# Patient Record
Sex: Female | Born: 1935 | Race: Black or African American | Hispanic: No | Smoking: Light tobacco smoker
Health system: Southern US, Community
[De-identification: ages and names within clinical notes are randomized; demographics above are authoritative.]

## PROBLEM LIST (undated history)

## (undated) DIAGNOSIS — Z872 Personal history of diseases of the skin and subcutaneous tissue: Secondary | ICD-10-CM

## (undated) DIAGNOSIS — M1990 Unspecified osteoarthritis, unspecified site: Secondary | ICD-10-CM

## (undated) DIAGNOSIS — N281 Cyst of kidney, acquired: Secondary | ICD-10-CM

## (undated) DIAGNOSIS — F17210 Nicotine dependence, cigarettes, uncomplicated: Secondary | ICD-10-CM

## (undated) DIAGNOSIS — F32A Depression, unspecified: Secondary | ICD-10-CM

## (undated) DIAGNOSIS — F329 Major depressive disorder, single episode, unspecified: Secondary | ICD-10-CM

## (undated) DIAGNOSIS — K5730 Diverticulosis of large intestine without perforation or abscess without bleeding: Secondary | ICD-10-CM

## (undated) DIAGNOSIS — I10 Essential (primary) hypertension: Secondary | ICD-10-CM

## (undated) DIAGNOSIS — M8580 Other specified disorders of bone density and structure, unspecified site: Secondary | ICD-10-CM

## (undated) DIAGNOSIS — I4891 Unspecified atrial fibrillation: Secondary | ICD-10-CM

## (undated) DIAGNOSIS — E785 Hyperlipidemia, unspecified: Secondary | ICD-10-CM

## (undated) DIAGNOSIS — Z800 Family history of malignant neoplasm of digestive organs: Secondary | ICD-10-CM

## (undated) DIAGNOSIS — F419 Anxiety disorder, unspecified: Secondary | ICD-10-CM

## (undated) DIAGNOSIS — G2581 Restless legs syndrome: Secondary | ICD-10-CM

## (undated) HISTORY — DX: Hyperlipidemia, unspecified: E78.5

## (undated) HISTORY — DX: Depression, unspecified: F32.A

## (undated) HISTORY — DX: Unspecified atrial fibrillation: I48.91

## (undated) HISTORY — DX: Restless legs syndrome: G25.81

## (undated) HISTORY — DX: Diverticulosis of large intestine without perforation or abscess without bleeding: K57.30

## (undated) HISTORY — DX: Family history of malignant neoplasm of digestive organs: Z80.0

## (undated) HISTORY — DX: Other specified disorders of bone density and structure, unspecified site: M85.80

## (undated) HISTORY — PX: CATARACT EXTRACTION: SUR2

## (undated) HISTORY — PX: UPPER GASTROINTESTINAL ENDOSCOPY: SHX188

## (undated) HISTORY — DX: Major depressive disorder, single episode, unspecified: F32.9

## (undated) HISTORY — DX: Anxiety disorder, unspecified: F41.9

## (undated) HISTORY — DX: Nicotine dependence, cigarettes, uncomplicated: F17.210

## (undated) HISTORY — DX: Cyst of kidney, acquired: N28.1

## (undated) HISTORY — DX: Essential (primary) hypertension: I10

## (undated) HISTORY — PX: COLONOSCOPY: SHX174

## (undated) HISTORY — DX: Personal history of diseases of the skin and subcutaneous tissue: Z87.2

## (undated) HISTORY — DX: Unspecified osteoarthritis, unspecified site: M19.90

---

## 1998-07-15 ENCOUNTER — Other Ambulatory Visit: Admission: RE | Admit: 1998-07-15 | Discharge: 1998-07-15 | Payer: Self-pay | Admitting: Gynecology

## 1999-07-25 ENCOUNTER — Other Ambulatory Visit: Admission: RE | Admit: 1999-07-25 | Discharge: 1999-07-25 | Payer: Self-pay | Admitting: Gynecology

## 2000-01-10 ENCOUNTER — Encounter: Admission: RE | Admit: 2000-01-10 | Discharge: 2000-01-10 | Payer: Self-pay | Admitting: Pulmonary Disease

## 2000-01-10 ENCOUNTER — Encounter: Payer: Self-pay | Admitting: Pulmonary Disease

## 2000-09-24 ENCOUNTER — Other Ambulatory Visit: Admission: RE | Admit: 2000-09-24 | Discharge: 2000-09-24 | Payer: Self-pay | Admitting: Gynecology

## 2001-05-26 ENCOUNTER — Encounter: Payer: Self-pay | Admitting: Gynecology

## 2001-05-26 ENCOUNTER — Encounter: Admission: RE | Admit: 2001-05-26 | Discharge: 2001-05-26 | Payer: Self-pay | Admitting: Gynecology

## 2002-03-25 ENCOUNTER — Other Ambulatory Visit: Admission: RE | Admit: 2002-03-25 | Discharge: 2002-03-25 | Payer: Self-pay | Admitting: Gynecology

## 2002-06-12 ENCOUNTER — Encounter: Admission: RE | Admit: 2002-06-12 | Discharge: 2002-06-12 | Payer: Self-pay | Admitting: Pulmonary Disease

## 2002-06-12 ENCOUNTER — Encounter: Payer: Self-pay | Admitting: Pulmonary Disease

## 2003-03-22 ENCOUNTER — Encounter: Admission: RE | Admit: 2003-03-22 | Discharge: 2003-03-22 | Payer: Self-pay | Admitting: Specialist

## 2003-03-22 ENCOUNTER — Encounter: Payer: Self-pay | Admitting: Specialist

## 2003-07-09 ENCOUNTER — Encounter: Admission: RE | Admit: 2003-07-09 | Discharge: 2003-07-09 | Payer: Self-pay | Admitting: Pulmonary Disease

## 2003-08-10 ENCOUNTER — Encounter: Admission: RE | Admit: 2003-08-10 | Discharge: 2003-08-10 | Payer: Self-pay | Admitting: Specialist

## 2003-09-28 HISTORY — PX: TOTAL HIP ARTHROPLASTY: SHX124

## 2003-10-22 ENCOUNTER — Inpatient Hospital Stay (HOSPITAL_COMMUNITY): Admission: RE | Admit: 2003-10-22 | Discharge: 2003-10-26 | Payer: Self-pay | Admitting: Specialist

## 2003-10-26 ENCOUNTER — Inpatient Hospital Stay (HOSPITAL_COMMUNITY)
Admission: RE | Admit: 2003-10-26 | Discharge: 2003-11-02 | Payer: Self-pay | Admitting: Physical Medicine & Rehabilitation

## 2004-07-28 ENCOUNTER — Encounter: Admission: RE | Admit: 2004-07-28 | Discharge: 2004-07-28 | Payer: Self-pay | Admitting: Pulmonary Disease

## 2004-08-23 ENCOUNTER — Ambulatory Visit: Payer: Self-pay | Admitting: Pulmonary Disease

## 2005-05-03 ENCOUNTER — Other Ambulatory Visit: Admission: RE | Admit: 2005-05-03 | Discharge: 2005-05-03 | Payer: Self-pay | Admitting: Gynecology

## 2005-08-24 ENCOUNTER — Ambulatory Visit: Payer: Self-pay | Admitting: Pulmonary Disease

## 2005-08-31 ENCOUNTER — Encounter: Admission: RE | Admit: 2005-08-31 | Discharge: 2005-08-31 | Payer: Self-pay | Admitting: Pulmonary Disease

## 2005-10-22 ENCOUNTER — Ambulatory Visit: Payer: Self-pay | Admitting: Pulmonary Disease

## 2005-10-24 ENCOUNTER — Ambulatory Visit: Payer: Self-pay | Admitting: Pulmonary Disease

## 2005-11-26 ENCOUNTER — Ambulatory Visit: Payer: Self-pay | Admitting: Internal Medicine

## 2006-01-22 ENCOUNTER — Ambulatory Visit: Payer: Self-pay | Admitting: Internal Medicine

## 2006-02-04 ENCOUNTER — Ambulatory Visit: Payer: Self-pay | Admitting: Internal Medicine

## 2006-02-21 ENCOUNTER — Ambulatory Visit: Payer: Self-pay | Admitting: Pulmonary Disease

## 2006-06-21 ENCOUNTER — Ambulatory Visit: Payer: Self-pay | Admitting: Pulmonary Disease

## 2006-09-18 ENCOUNTER — Encounter: Admission: RE | Admit: 2006-09-18 | Discharge: 2006-09-18 | Payer: Self-pay | Admitting: Pulmonary Disease

## 2006-10-20 ENCOUNTER — Emergency Department (HOSPITAL_COMMUNITY): Admission: EM | Admit: 2006-10-20 | Discharge: 2006-10-20 | Payer: Self-pay | Admitting: Emergency Medicine

## 2006-12-23 ENCOUNTER — Ambulatory Visit: Payer: Self-pay | Admitting: Pulmonary Disease

## 2007-06-24 ENCOUNTER — Ambulatory Visit: Payer: Self-pay | Admitting: Pulmonary Disease

## 2007-10-02 ENCOUNTER — Encounter: Admission: RE | Admit: 2007-10-02 | Discharge: 2007-10-02 | Payer: Self-pay | Admitting: Pulmonary Disease

## 2007-11-25 DIAGNOSIS — R5381 Other malaise: Secondary | ICD-10-CM | POA: Insufficient documentation

## 2007-11-25 DIAGNOSIS — R5383 Other fatigue: Secondary | ICD-10-CM

## 2007-11-25 DIAGNOSIS — I10 Essential (primary) hypertension: Secondary | ICD-10-CM | POA: Insufficient documentation

## 2007-11-25 DIAGNOSIS — M1990 Unspecified osteoarthritis, unspecified site: Secondary | ICD-10-CM | POA: Insufficient documentation

## 2007-11-25 DIAGNOSIS — E7800 Pure hypercholesterolemia, unspecified: Secondary | ICD-10-CM

## 2007-11-25 DIAGNOSIS — F419 Anxiety disorder, unspecified: Secondary | ICD-10-CM | POA: Insufficient documentation

## 2007-12-22 ENCOUNTER — Ambulatory Visit: Payer: Self-pay | Admitting: Pulmonary Disease

## 2007-12-22 DIAGNOSIS — F17200 Nicotine dependence, unspecified, uncomplicated: Secondary | ICD-10-CM

## 2007-12-22 DIAGNOSIS — K5730 Diverticulosis of large intestine without perforation or abscess without bleeding: Secondary | ICD-10-CM | POA: Insufficient documentation

## 2007-12-22 DIAGNOSIS — H409 Unspecified glaucoma: Secondary | ICD-10-CM

## 2007-12-22 DIAGNOSIS — G2581 Restless legs syndrome: Secondary | ICD-10-CM | POA: Insufficient documentation

## 2007-12-24 ENCOUNTER — Ambulatory Visit: Payer: Self-pay | Admitting: Pulmonary Disease

## 2007-12-28 LAB — CONVERTED CEMR LAB
ALT: 18 units/L (ref 0–35)
AST: 21 units/L (ref 0–37)
Albumin: 3.8 g/dL (ref 3.5–5.2)
BUN: 10 mg/dL (ref 6–23)
Basophils Relative: 0.6 % (ref 0.0–1.0)
Calcium: 9.4 mg/dL (ref 8.4–10.5)
Chloride: 108 meq/L (ref 96–112)
Creatinine, Ser: 0.8 mg/dL (ref 0.4–1.2)
Eosinophils Absolute: 0.2 10*3/uL (ref 0.0–0.7)
Free T4: 0.7 ng/dL (ref 0.6–1.6)
GFR calc non Af Amer: 75 mL/min
HDL: 54.2 mg/dL (ref >39.0)
LDL Cholesterol: 112 mg/dL — ABNORMAL HIGH (ref 0–99)
Lymphocytes Relative: 41.9 % (ref 12.0–46.0)
Monocytes Relative: 11.1 % (ref 3.0–12.0)
Neutro Abs: 1.9 10*3/uL (ref 1.4–7.7)
Platelets: 180 10*3/uL (ref 150–400)
RDW: 12.9 % (ref 11.5–14.6)
T3, Free: 3.5 pg/mL (ref 2.3–4.2)
Total CHOL/HDL Ratio: 3.3
Triglycerides: 73 mg/dL (ref 0–149)

## 2008-01-13 ENCOUNTER — Encounter: Payer: Self-pay | Admitting: Pulmonary Disease

## 2008-02-06 ENCOUNTER — Encounter: Payer: Self-pay | Admitting: Pulmonary Disease

## 2008-06-03 ENCOUNTER — Emergency Department (HOSPITAL_COMMUNITY): Admission: EM | Admit: 2008-06-03 | Discharge: 2008-06-03 | Payer: Self-pay | Admitting: Emergency Medicine

## 2008-06-22 ENCOUNTER — Ambulatory Visit: Payer: Self-pay | Admitting: Pulmonary Disease

## 2008-06-22 DIAGNOSIS — F32A Depression, unspecified: Secondary | ICD-10-CM | POA: Insufficient documentation

## 2008-06-22 DIAGNOSIS — F329 Major depressive disorder, single episode, unspecified: Secondary | ICD-10-CM | POA: Insufficient documentation

## 2008-07-16 ENCOUNTER — Ambulatory Visit: Payer: Self-pay | Admitting: Internal Medicine

## 2008-07-19 ENCOUNTER — Ambulatory Visit: Payer: Self-pay | Admitting: Internal Medicine

## 2008-07-19 ENCOUNTER — Encounter: Payer: Self-pay | Admitting: Internal Medicine

## 2008-07-22 ENCOUNTER — Encounter: Payer: Self-pay | Admitting: Internal Medicine

## 2008-07-26 ENCOUNTER — Ambulatory Visit (HOSPITAL_COMMUNITY): Admission: RE | Admit: 2008-07-26 | Discharge: 2008-07-26 | Payer: Self-pay | Admitting: Internal Medicine

## 2008-08-05 ENCOUNTER — Telehealth: Payer: Self-pay | Admitting: Internal Medicine

## 2008-09-01 ENCOUNTER — Ambulatory Visit: Payer: Self-pay | Admitting: Internal Medicine

## 2008-09-02 ENCOUNTER — Telehealth: Payer: Self-pay | Admitting: Internal Medicine

## 2008-09-07 ENCOUNTER — Encounter: Payer: Self-pay | Admitting: Internal Medicine

## 2008-09-28 ENCOUNTER — Encounter: Payer: Self-pay | Admitting: Internal Medicine

## 2008-10-12 ENCOUNTER — Encounter: Admission: RE | Admit: 2008-10-12 | Discharge: 2008-10-12 | Payer: Self-pay | Admitting: Pulmonary Disease

## 2008-10-22 ENCOUNTER — Encounter: Payer: Self-pay | Admitting: Pulmonary Disease

## 2008-12-20 ENCOUNTER — Ambulatory Visit: Payer: Self-pay | Admitting: Pulmonary Disease

## 2008-12-20 DIAGNOSIS — N281 Cyst of kidney, acquired: Secondary | ICD-10-CM | POA: Insufficient documentation

## 2008-12-21 ENCOUNTER — Ambulatory Visit: Payer: Self-pay | Admitting: Pulmonary Disease

## 2008-12-28 ENCOUNTER — Telehealth: Payer: Self-pay | Admitting: Pulmonary Disease

## 2008-12-28 LAB — CONVERTED CEMR LAB
ALT: 22 units/L (ref 0–35)
AST: 22 units/L (ref 0–37)
Bilirubin, Direct: 0.1 mg/dL (ref 0.0–0.3)
Chloride: 112 meq/L (ref 96–112)
Cholesterol: 168 mg/dL (ref 0–200)
Eosinophils Absolute: 0.2 10*3/uL (ref 0.0–0.7)
LDL Cholesterol: 102 mg/dL — ABNORMAL HIGH (ref 0–99)
MCHC: 34.4 g/dL (ref 30.0–36.0)
MCV: 91.4 fL (ref 78.0–100.0)
Monocytes Absolute: 0.3 10*3/uL (ref 0.1–1.0)
Neutrophils Relative %: 41.6 % — ABNORMAL LOW (ref 43.0–77.0)
Platelets: 168 10*3/uL (ref 150.0–400.0)
Potassium: 4.1 meq/L (ref 3.5–5.1)
RDW: 12.6 % (ref 11.5–14.6)
TSH: 0.79 microintl units/mL (ref 0.35–5.50)
Total Bilirubin: 0.9 mg/dL (ref 0.3–1.2)
Total CHOL/HDL Ratio: 3
Vit D, 25-Hydroxy: 23 ng/mL — ABNORMAL LOW (ref 30–89)

## 2009-06-20 ENCOUNTER — Ambulatory Visit: Payer: Self-pay | Admitting: Pulmonary Disease

## 2009-06-20 DIAGNOSIS — E559 Vitamin D deficiency, unspecified: Secondary | ICD-10-CM

## 2009-10-20 ENCOUNTER — Encounter: Admission: RE | Admit: 2009-10-20 | Discharge: 2009-10-20 | Payer: Self-pay | Admitting: Pulmonary Disease

## 2009-10-28 ENCOUNTER — Telehealth: Payer: Self-pay | Admitting: Pulmonary Disease

## 2009-12-19 ENCOUNTER — Ambulatory Visit: Payer: Self-pay | Admitting: Pulmonary Disease

## 2009-12-27 ENCOUNTER — Ambulatory Visit: Payer: Self-pay | Admitting: Internal Medicine

## 2009-12-27 ENCOUNTER — Ambulatory Visit: Payer: Self-pay | Admitting: Pulmonary Disease

## 2010-06-21 ENCOUNTER — Ambulatory Visit: Payer: Self-pay | Admitting: Pulmonary Disease

## 2010-06-21 DIAGNOSIS — R634 Abnormal weight loss: Secondary | ICD-10-CM

## 2010-06-23 DIAGNOSIS — M810 Age-related osteoporosis without current pathological fracture: Secondary | ICD-10-CM | POA: Insufficient documentation

## 2010-06-23 DIAGNOSIS — M8580 Other specified disorders of bone density and structure, unspecified site: Secondary | ICD-10-CM

## 2010-06-23 LAB — CONVERTED CEMR LAB
Albumin: 3.6 g/dL (ref 3.5–5.2)
BUN: 17 mg/dL (ref 6–23)
Basophils Relative: 0.7 % (ref 0.0–3.0)
Calcium: 9.3 mg/dL (ref 8.4–10.5)
Creatinine, Ser: 0.8 mg/dL (ref 0.4–1.2)
Eosinophils Relative: 3.4 % (ref 0.0–5.0)
Free T4: 0.76 ng/dL (ref 0.60–1.60)
GFR calc non Af Amer: 85.1 mL/min (ref >60)
HCT: 40.2 % (ref 36.0–46.0)
Lymphs Abs: 1.6 10*3/uL (ref 0.7–4.0)
MCV: 92.1 fL (ref 78.0–100.0)
Monocytes Absolute: 0.6 10*3/uL (ref 0.1–1.0)
Monocytes Relative: 12.6 % — ABNORMAL HIGH (ref 3.0–12.0)
RBC: 4.37 M/uL (ref 3.87–5.11)
Total Bilirubin: 0.6 mg/dL (ref 0.3–1.2)
WBC: 4.7 10*3/uL (ref 4.5–10.5)

## 2010-09-24 LAB — CONVERTED CEMR LAB
AST: 28 units/L (ref 0–37)
Albumin: 3.9 g/dL (ref 3.5–5.2)
Alkaline Phosphatase: 81 units/L (ref 39–117)
Basophils Absolute: 0 10*3/uL (ref 0.0–0.1)
CO2: 29 meq/L (ref 19–32)
Calcium: 9.5 mg/dL (ref 8.4–10.5)
Creatinine, Ser: 0.8 mg/dL (ref 0.4–1.2)
Eosinophils Absolute: 0.2 10*3/uL (ref 0.0–0.7)
HCT: 41.6 % (ref 36.0–46.0)
Hemoglobin: 14.1 g/dL (ref 12.0–15.0)
Lymphocytes Relative: 35.4 % (ref 12.0–46.0)
Lymphs Abs: 1.6 10*3/uL (ref 0.7–4.0)
MCHC: 33.8 g/dL (ref 30.0–36.0)
MCV: 91.2 fL (ref 78.0–100.0)
Monocytes Absolute: 0.5 10*3/uL (ref 0.1–1.0)
Neutro Abs: 2.3 10*3/uL (ref 1.4–7.7)
RDW: 13.2 % (ref 11.5–14.6)
Total CHOL/HDL Ratio: 3
Triglycerides: 64 mg/dL (ref 0.0–149.0)

## 2010-09-26 NOTE — Miscellaneous (Signed)
Summary: BONE DENSITY  Clinical Lists Changes  Orders: Added new Test order of T-Bone Densitometry [phone]) - Signed Added new Test order of T-Lumbar Vertebral Assessment [phone]) - Signed  Appended Document: BONE DENSITY called and spoke with pt about her BMD results per SN---bmd shows osteopenia---softening of the bones---rec cont the oscal, mvi and vit d 1000 untis daily and weight bearing exercises.  she is aware of this and will cont

## 2010-09-26 NOTE — Assessment & Plan Note (Signed)
Summary: 6 months/apc   Primary Care Provider:  Alroy Dust, MD  CC:  6 month ROV & review of mult medical problems....  History of Present Illness: [name] here for a follow up visit... she has HBP, Chol, DJD, and mult med problems as noted below...    ~  seen by DrDBrodie 11/09 & subseq w/ dyspepsia, and lower GI symptoms as well... GI  notes reviewed- there is a fam hx of colon ca & a sis w/ pancreatic cancer... she was placed on PPI Rx and Sonar was neg... EGD showed esoph stricture- dilated, and gastritis- Rx w/ PPI... DrHorvath sent her to DrToth for painful defecation & he sent her to DrFuller in [place]... he diagnosed mixed hem's and outlet dysfunction w/ more studies including defecography planned at [facility]...   ~  June 20, 2009:  she reports eval in [place] by DrFuller w/ mult tests and rec for muscle retraining but insurance wouldn't pay for this "experimental therapy" & it was $500 per session therefore she declined...  still smoking but down to 1/4 ppd... BP controlled & taking meds regularly...   ~  December 19, 2009:  she's been getting the "muscle therapy" in [place], now on home exercises... still smoking & we discussed smoking cessation strategies (CXR 10/10 showed NAD).Marland Kitchen. BP controlled & doing satis w/ switch to Losartan... she is due for FASTING blood work & BMD &will ret to our lab for these tests... we discussed Rx for her RLS...    Current Problem List:  FATIGUE (ICD-780.79) - she rests OK, wakes tired most days, no daytime hypersomnolence...  GLAUCOMA (ICD-365.9) - on eye drops per ophthalmology...  CIGARETTE SMOKER (ICD-305.1) - still smokes 1/4+ppd & must quit, she knows the risks, states she'll try nicotine patches...   ~  CXR 10/07 showed sl hyperinflation, mild incr Cor...  ~  CXR in ER 10/09 showed COPD, bibasilar scarring, NAD.Marland Kitchen.  ~  CXR 10/10 showed similar findings- scarring, NAD...  HYPERTENSION (ICD-401.9) - controlled on ASA 81mg /d, TOPROL XL 50mg /d & LOSARTAN/HCT  100-12.5 daily.. BP= 132/78 today- she tells me that she sometimes takes these meds Qod and that she feels better on this schedule & wishes to continue these same meds in this manner... denies HA, visual sx, CP, palpit, change in SOB, edema, etc...  HYPERCHOLESTEROLEMIA (ICD-272.0) - on LIPITOR 20mg /d...  ~  FLP 2/07 showed TChol 159, TG 127, HDL 54, LDL 79  ~  FLP 4/09 showed TChol 181, TG 73, HDL 54, LDL 112  ~  FLP 4/10 showed TChol 168, TG 63, HDL 53, LDL 102  ~  FLP 4/11 =   DYSPEPSIA (ICD-536.8) - on PRILOSEC OTC 20mg /d Prn...  DIVERTICULOSIS OF COLON (ICD-562.10) - she alternates MIRALAX & METAMUCIL Qod... COLONIC POLYPS (ICD-211.3) - last colonoscopy by DrBrodie 6/07 showed divertics, hems... f/u planned 69yrs. Family Hx of COLON CANCER (ICD-153.9) HEMORRHOIDS (ICD-455.6) - "I'm using TUCKS Pads per DrOz"... OTHER SPECIFIED DISORDER OF RECTUM AND ANUS (ICD-569.49) -  - eval by DrToth & referred to [place] DrFuller for painful defecation... he recommended muscle retraining but initially her insurance wouldn't cover this, later they did cover & she had the "muscle therapy" now doing home therapy...  RENAL CYST (ICD-593.2) - Abd Sonar 11/09 showed sm bilat renal cysts...  DEGENERATIVE JOINT DISEASE (ICD-715.90) - s/p right THR 3/05 by DrBeane... she also has a known lumbar spondylosis w/ right leg radiculopathy... she states that exercise is helping...  ~  10/10: c/o knee pain-  she declines Rheum/ Ortho referral... continue OTC meds Prn...  VITAMIN D DEFICIENCY (ICD-268.9) - Vit D level 4/10 = 23 & started on Vit D 1000 u OTC daily...  Hx of RESTLESS LEGS SYNDROME (ICD-333.94) - we decided to try REQUIP 1mg  Qhs...  ANXIETY (ICD-300.00) - she uses ALPRAZOLAM 0.5mg  Prn...  ~  10/09 states depressed- 2 sisters died 2023-01-24- one w/ ca pancreas, other w/ DM/ ASPVD... we discussed trial of Zoloft 50mg Qhs...  HEALTH MAINTENANCE:  she sees DrMezer et al for [employer]... she gets the yearly seasonal Flu  vaccines, and had the PNEUMOVAX in 2005 at age 43...   Allergies (verified): No Known Drug Allergies  Comments:  Nurse/Medical Assistant: The patient's medications and allergies were reviewed with the patient and were updated in the Medication and Allergy Lists.  Past History:  Past Medical History: GLAUCOMA (ICD-365.9) CIGARETTE SMOKER (ICD-305.1) HYPERTENSION (ICD-401.9) HYPERCHOLESTEROLEMIA (ICD-272.0) DYSPEPSIA (ICD-536.8) DIVERTICULOSIS OF COLON (ICD-562.10) COLONIC POLYPS (ICD-211.3) Family Hx of COLON CANCER (ICD-153.9) HEMORRHOIDS (ICD-455.6) OTHER SPECIFIED DISORDER OF RECTUM AND ANUS (ICD-569.49) RENAL CYST (ICD-593.2) DEGENERATIVE JOINT DISEASE (ICD-715.90) VITAMIN D DEFICIENCY (ICD-268.9) Hx of RESTLESS LEGS SYNDROME (ICD-333.94) ANXIETY (ICD-300.00) DEPRESSION (ICD-311)  Past Surgical History: S/P right THR 2/05 by DrBeane  Family History: Reviewed history from 07/15/2008 and no changes required. mother deceased age 10 hx of HBP and DM father deceased age 15 Family History of Breast Cancer:  Family History of Colon Cancer: Brother Family History of Prostate Cancer: Father Family History of Diabetes: Sister, Brother  Social History: Reviewed history from 06/22/2008 and no changes required. current smoker--6 cigs per day exercises---2 times per week caffeine use:  1 cups coffee per day no etoh widowed no children  Review of Systems      See HPI       The patient complains of dyspnea on exertion.  The patient denies anorexia, fever, weight loss, weight gain, vision loss, decreased hearing, hoarseness, chest pain, syncope, peripheral edema, prolonged cough, headaches, hemoptysis, abdominal pain, melena, hematochezia, severe indigestion/heartburn, hematuria, incontinence, muscle weakness, suspicious skin lesions, transient blindness, difficulty walking, depression, unusual weight change, abnormal bleeding, enlarged lymph nodes, and angioedema.    Vital  Signs:  Patient profile:   77 year old female Height:      66 inches Weight:      143.25 pounds BMI:     23.20 O2 Sat:      99 % on Room air Temp:     96.6 degrees F oral Pulse rate:   72 / minute BP sitting:   132 / 78  (right arm) Cuff size:   regular  Vitals Entered By: Randell Loop CMA (December 19, 2009 9:26 AM)  O2 Sat at Rest %:  99 O2 Flow:  Room air CC: 6 month ROV & review of mult medical problems... Is Patient Diabetic? No Pain Assessment Patient in pain? no      Comments meds updated today   Physical Exam  Additional Exam:  WD, WN, [name] in NAD... GENERAL:  Alert & oriented; pleasant & cooperative. HEENT:  [place]/AT, EOM-wnl, PERRLA, EACs-clear, TMs-wnl, NOSE-clear, THROAT-clear & wnl. NECK:  Supple w/ fairROM; no JVD; normal carotid impulses w/o bruits; no thyromegaly or nodules palpated; no lymphadenopathy. CHEST:  Clear to P & A; without wheezes/ rales/ or rhonchi. HEART:  Regular Rhythm;  gr 1/6 SEM without rubs or gallops heard... ABDOMEN:  Soft & nontender; normal bowel sounds; no organomegaly or masses detected. EXT: without deformities, mod arthritic changes; no varicose  veins/ +venous insuffic/ tr edema. NEURO:  CN's intact;  no focal neuro deficits... DERM:  No lesions noted; no rash etc...    MISC. Report  Procedure date:  12/19/2009  Findings:      DATA TO BE REVIEWED:  She will ret to our lab for FASTING blood work & BMD... we will notify her of the results...    Impression & Recommendations:  Problem # 1:  CIGARETTE SMOKER (ICD-305.1) We discussed smoking cessation strategies & offered Chantix... she prefers to try nicotine patches...  Problem # 2:  HYPERTENSION (ICD-401.9) Controlled on meds-  continue same. Her updated medication list for this problem includes:    Metoprolol Tartrate 50 Mg Tabs (Metoprolol tartrate) .Marland Kitchen... Take 1 by mouth once daily...    Hyzaar 100-12.5 Mg Tabs (Losartan potassium-hctz) .Marland Kitchen... Take 1 tablet by mouth  once a day  Problem # 3:  HYPERCHOLESTEROLEMIA (ICD-272.0) Due for FLP-  she will ret to our lab as outlined... Her updated medication list for this problem includes:    Lipitor 20 Mg Tabs (Atorvastatin calcium) .Marland Kitchen... Take 1 tablet by mouth once a day  Problem # 4:  DYSPEPSIA (ICD-536.8) GI is stable & reviewed w/ pt...  Problem # 5:  DEGENERATIVE JOINT DISEASE (ICD-715.90) Continue OTC meds Prn... Her updated medication list for this problem includes:    Adult Aspirin Low Strength 81 Mg Tbdp (Aspirin) .Marland Kitchen... Take 1 tablet by mouth once a day  Problem # 6:  Hx of RESTLESS LEGS SYNDROME (ICD-333.94) We discussed trial REQUIP 0.5 - 1mg  Qhs...  Problem # 7:  ANXIETY (ICD-300.00) Continue Alpraz Prn... Her updated medication list for this problem includes:    Alprazolam 0.5 Mg Tabs (Alprazolam) .Marland Kitchen... Take 1/2 - 1 tablet by mouth three times a day as needed for nerves...  Complete Medication List: 1)  Travatan 0.004 % Soln (Travoprost) .... Use as directed 2)  Adult Aspirin Low Strength 81 Mg Tbdp (Aspirin) .... Take 1 tablet by mouth once a day 3)  Metoprolol Tartrate 50 Mg Tabs (Metoprolol tartrate) .... Take 1 by mouth once daily.Marland KitchenMarland Kitchen 4)  Hyzaar 100-12.5 Mg Tabs (Losartan potassium-hctz) .... Take 1 tablet by mouth once a day 5)  Lipitor 20 Mg Tabs (Atorvastatin calcium) .... Take 1 tablet by mouth once a day 6)  Miralax Powd (Polyethylene glycol 3350) .... Take 1 capful in water daily.Marland KitchenMarland Kitchen 7)  Alprazolam 0.5 Mg Tabs (Alprazolam) .... Take 1/2 - 1 tablet by mouth three times a day as needed for nerves... 8)  Multivitamins Tabs (Multiple vitamin) .... Take 1 tablet by mouth once a day 9)  Vitamin D3 1000 Unit Tabs (Cholecalciferol) .... Take 1 tablet by mouth once a day 10)  Oscal 500/200 D-3 500-200 Mg-unit Tabs (Calcium-vitamin d) .... Take 1 tab daily... 11)  Ropinirole Hcl 1 Mg Tabs (Ropinirole hcl) .... Take 1/2 tab by mouth prior to bedtime for 1 week, then increase to 1  tab...  Other Orders: T-Bone Densitometry [phone])  Patient Instructions: 1)  Today we updated your med list- see below.... 2)  Continue your current meds the same... 3)  Please return to our lab one morning next week for your FASTING blood work... we will try to arrange a bone density test for the same day & time.Marland KitchenMarland Kitchen 4)  please call the "phone tree" in a few days for your lab results.Marland KitchenMarland Kitchen  5)  Stay as active as possible, and PLEASE please please quit smoking!!! 6)  Call for any problems.Marland KitchenMarland Kitchen 7)  Please  schedule a follow-up appointment in 6 months. Prescriptions: ROPINIROLE HCL 1 MG TABS (ROPINIROLE HCL) take 1/2 tab by mouth prior to bedtime for 1 week, then increase to 1 tab...  #30 x prn   Entered and Authorized by:   Michele Mcalpine MD   Signed by:   Michele Mcalpine MD on 12/20/2009   Method used:   Print then Give to Patient   RxID:   [id number]

## 2010-09-26 NOTE — Assessment & Plan Note (Signed)
Summary: 6 MONTHS/ MBW   Primary Care Provider:  Alroy Dust, MD  CC:  6 month ROV & review of mult medical problems....  History of Present Illness: [name] here for a follow up visit... she has HBP, Chol, DJD, and mult med problems as noted below...    ~  seen by DrDBrodie 11/09 & subseq w/ dyspepsia, and lower GI symptoms as well... GI  notes reviewed- there is a fam hx of colon ca & a sis w/ pancreatic cancer... she was placed on PPI Rx and Sonar was neg... EGD showed esoph stricture- dilated, and gastritis- Rx w/ PPI... DrHorvath sent her to DrToth for painful defecation & he sent [place] to [place]Fuller [place]... he diagnosed mixed hem's and outlet dysfunction w/ more studies including defecography planned at [facility]...   ~  June 20, 2009:  she reports eval in [place] by DrFuller w/ mult tests and rec for muscle retraining but insurance wouldn't pay for this "experimental therapy" & it was $500 per session therefore she declined...  still smoking but down to 1/4 ppd... BP controlled & taking meds regularly...   ~  December 19, 2009:  she's been getting the "muscle therapy" in [place], now on home exercises... still smoking & we discussed smoking cessation strategies (CXR 10/10 showed NAD).Marland Kitchen. BP controlled & doing satis w/ switch to Losartan... she is due for FASTING blood work & BMD &will ret to our lab for these tests... we discussed Rx for her RLS...   ~  June 21, 2010:  she has lost 10# down to 133# but notes her appetite is good, denies pain anywhere, no localizing signs or symptoms- CXR w/ COPD, NAD; and labs OK... still smokes  ~1/2 ppd, denies much cough & no sputum or hemoptysis etc... no CP, palpit, or ch in DOE... BP controlled on meds;  Chol has been good on Lip20;  GI somewhat improved after her muscle therapy;  BMD checked 5/11 w/ osteopenia- on Calcium, MVI Vit D... OK Flu shot today, she will try Ensure.   Current Problem List:  FATIGUE (ICD-780.79) - she rests OK, wakes tired most days,  no daytime hypersomnolence...  GLAUCOMA (ICD-365.9) - on eye drops per ophthalmology...  CIGARETTE SMOKER (ICD-305.1) - still smokes  ~1/2ppd & must quit, she knows the risks, states she'll try nicotine patches...   ~  CXR 10/07 showed sl hyperinflation, mild incr Cor...  ~  CXR in ER 10/09 showed COPD, bibasilar scarring, NAD.Marland Kitchen.  ~  CXR 10/10 showed similar findings- scarring, NAD.Marland Kitchen.  ~  CXR 10/11 showed COPD/ Emphysema, bibasilar scarring, DJD sp, NAD.  HYPERTENSION (ICD-401.9) - controlled on ASA 81mg /d, TOPROL XL 50mg /d & LOSARTAN/HCT 100-12.5 daily.. BP= 134/76 today- she tells me that she sometimes takes these meds Qod and that she feels better on this schedule & wishes to continue these same meds in this manner... denies HA, visual sx, CP, palpit, change in SOB, edema, etc...  HYPERCHOLESTEROLEMIA (ICD-272.0) - on LIPITOR 20mg /d...  ~  FLP 2/07 showed TChol 159, TG 127, HDL 54, LDL 79  ~  FLP 4/09 showed TChol 181, TG 73, HDL 54, LDL 112  ~  FLP 4/10 showed TChol 168, TG 63, HDL 53, LDL 102  ~  FLP 4/11 showed TChol 169, TG 64, HDL 57, LDL 100  DYSPEPSIA (ICD-536.8) - uses PRILOSEC OTC 20mg /d Prn...  DIVERTICULOSIS OF COLON (ICD-562.10) - she alternates MIRALAX & METAMUCIL Qod... COLONIC POLYPS (ICD-211.3) - last colonoscopy by DrBrodie 6/07 showed  divertics, hems... f/u planned 58yrs. Family Hx of COLON CANCER (ICD-153.9) HEMORRHOIDS (ICD-455.6) - "I'm using TUCKS Pads per DrOz"... OTHER SPECIFIED DISORDER OF RECTUM AND ANUS (ICD-569.49) -  - eval by DrToth & referred to [place] DrFuller for painful defecation... he recommended muscle retraining but initially her insurance wouldn't cover this, later they did cover & she had the "muscle therapy" now doing home therapy...  RENAL CYST (ICD-593.2) - Abd Sonar 11/09 showed sm bilat renal cysts...  DEGENERATIVE JOINT DISEASE (ICD-715.90) - s/p right THR 3/05 by DrBeane... she also has a known lumbar spondylosis w/ right leg radiculopathy...  she states that exercise is helping...  ~  10/10: c/o knee pain- she declines Rheum/ Ortho referral... continue OTC meds (Advil) Prn...  OSTEOPENIA (ICD-733.90) & VITAMIN D DEFICIENCY (ICD-268.9) -   ~  labs 4/10 showed Vit D level = 23 & started on Vit D 1000 u OTC daily...  ~  BMD 5/11 showed TScores -1.2 in Spine, and -1.6 in [facility]... on Calcium, MVI, Vit D.  ~  labs 5/11 showed Vit D level = 41... continue 1000 u daily.  Hx of RESTLESS LEGS SYNDROME (ICD-333.94) - we decided to try REQUIP 1mg  Qhs...  ANXIETY (ICD-300.00) - she states that she stopped the Alprazolam (prev 0.5mg  Prn)...  ~  10/09 states depressed- 2 sisters died 01/15/2023- one w/ ca pancreas, other w/ DM/ ASPVD.Marland Kitchen. tried Zofoft transiently then stopped.  HEALTH MAINTENANCE:  she sees DrMezer et al for [employer]... she gets the yearly seasonal Flu vaccines, and had the PNEUMOVAX in 2005 at age 68...   Preventive Screening-Counseling & Management  Alcohol-Tobacco     Smoking Status: current  Comments: 6 cigs daily  Allergies (verified): No Known Drug Allergies  Comments:  Nurse/Medical Assistant: The patient's medications and allergies were reviewed with the patient and were updated in the Medication and Allergy Lists.  Past History:  Past Medical History: GLAUCOMA (ICD-365.9) CIGARETTE SMOKER (ICD-305.1) HYPERTENSION (ICD-401.9) HYPERCHOLESTEROLEMIA (ICD-272.0) DYSPEPSIA (ICD-536.8) DIVERTICULOSIS OF COLON (ICD-562.10) COLONIC POLYPS (ICD-211.3) Family Hx of COLON CANCER (ICD-153.9) HEMORRHOIDS (ICD-455.6) OTHER SPECIFIED DISORDER OF RECTUM AND ANUS (ICD-569.49) RENAL CYST (ICD-593.2) DEGENERATIVE JOINT DISEASE (ICD-715.90) OSTEOPENIA (ICD-733.90) VITAMIN D DEFICIENCY (ICD-268.9) Hx of RESTLESS LEGS SYNDROME (ICD-333.94) ANXIETY (ICD-300.00) DEPRESSION (ICD-311)  Past Surgical History: S/P right THR 2/05 by DrBeane  Family History: Reviewed history from 07/15/2008 and no changes required. mother  deceased age 66 hx of HBP and DM father deceased age 85 Family History of Breast Cancer:  Family History of Colon Cancer: Brother Family History of Prostate Cancer: Father Family History of Diabetes: Sister, Brother  Social History: Reviewed history from 06/22/2008 and no changes required. current smoker--6 cigs per day exercises---2 times per week caffeine use:  1 cups coffee per day no etoh widowed no children  Review of Systems      See HPI       The patient complains of dyspnea on exertion.  The patient denies anorexia, fever, weight loss, weight gain, vision loss, decreased hearing, hoarseness, chest pain, syncope, peripheral edema, prolonged cough, headaches, hemoptysis, abdominal pain, melena, hematochezia, severe indigestion/heartburn, hematuria, incontinence, muscle weakness, suspicious skin lesions, transient blindness, difficulty walking, depression, unusual weight change, abnormal bleeding, enlarged lymph nodes, and angioedema.    Vital Signs:  Patient profile:   77 year old female Height:      66 inches Weight:      133 pounds BMI:     21.54 O2 Sat:      98 % on Room  air Temp:     96.8 degrees F oral Pulse rate:   77 / minute BP sitting:   134 / 76  (left arm) Cuff size:   regular  Vitals Entered By: Randell Loop CMA (June 21, 2010 9:20 AM)  O2 Sat at Rest %:  98 O2 Flow:  Room air CC: 6 month ROV & review of mult medical problems... Is Patient Diabetic? No Pain Assessment Patient in pain? no      Comments meds updated today with pt   Physical Exam  Additional Exam:  WD, WN, 76 y/o BF in NAD... GENERAL:  Alert & oriented; pleasant & cooperative. HEENT:  [place]/AT, EOM-wnl, PERRLA, EACs-clear, TMs-wnl, NOSE-clear, THROAT-clear & wnl. NECK:  Supple w/ fairROM; no JVD; normal carotid impulses w/o bruits; no thyromegaly or nodules palpated; no lymphadenopathy. CHEST:  Clear to P & A; without wheezes/ rales/ or rhonchi. HEART:  Regular Rhythm;  gr 1/6 SEM  without rubs or gallops heard... ABDOMEN:  Soft & nontender; normal bowel sounds; no organomegaly or masses detected. EXT: without deformities, mod arthritic changes; no varicose veins/ +venous insuffic/ tr edema. NEURO:  CN's intact;  no focal neuro deficits... DERM:  No lesions noted; no rash etc...    CXR  Procedure date:  06/21/2010  Findings:      CHEST - 2 VIEW Comparison: 06/20/2009   Findings: The cardiomediastinal silhouette is unremarkable. COPD/emphysema is identified. Minimal bibasilar scarring is unchanged. There is no evidence of focal airspace disease, pulmonary edema, pulmonary nodule/mass, pleural effusion, or pneumothorax. No acute bony abnormalities are identified. Thoracic degenerative disc disease again identified.   IMPRESSION: COPD/emphysema without evidence of active cardiopulmonary disease.   Read By:  Rosendo Gros,  M.D.   MISC. Report  Procedure date:  06/21/2010  Findings:      BMP (METABOL)   Sodium                    140 mEq/L                   135-145   Potassium                 4.1 mEq/L                   3.5-5.1   Chloride                  107 mEq/L                   96-112   Carbon Dioxide            31 mEq/L                    19-32   Glucose                   85 mg/dL                    16-10   BUN                       17 mg/dL                    9-60   Creatinine                0.8 mg/dL  0.4-1.2   Calcium                   9.3 mg/dL                   0.4-54.0   GFR                       85.10 mL/min                >60  Hepatic/Liver Function Panel (HEPATIC)   Total Bilirubin           0.6 mg/dL                   9.8-1.1   Direct Bilirubin          0.1 mg/dL                   9.1-4.7   Alkaline Phosphatase      71 U/L                      39-117   AST                       26 U/L                      0-37   ALT                       29 U/L                      0-35   Total Protein             6.3 g/dL                     8.2-9.5   Albumin                   3.6 g/dL                    6.2-1.3  CBC Platelet w/Diff (CBCD)   White Cell Count          4.7 K/uL                    4.5-10.5   Red Cell Count            4.37 Mil/uL                 3.87-5.11   Hemoglobin                13.8 g/dL                   08.6-57.8   Hematocrit                40.2 %                      36.0-46.0   MCV                       92.1 fl                     78.0-100.0   Platelet Count            175.0 K/uL  150.0-400.0   Neutrophil %              48.2 %                      43.0-77.0   Lymphocyte %              35.1 %                      12.0-46.0   Monocyte %           [H]  12.6 %                      3.0-12.0   Eosinophils%              3.4 %                       0.0-5.0   Basophils %               0.7 %                       0.0-3.0  Comments:      TSH (TSH)   FastTSH                   0.39 uIU/mL                 0.35-5.50  Tests: (5) T4, Free (FT4R)   Free T4                   0.76 ng/dL                  0.60-1.60   Impression & Recommendations:  Problem # 1:  WEIGHT LOSS (ICD-783.21) There are no localizing signs or symptoms>  rec to stop smoking & incr supplements like Ensure/ Boost/ etc... Orders: T-2 View CXR (71020TC) TLB-BMP (Basic Metabolic Panel-BMET) (80048-METABOL) TLB-Hepatic/Liver Function Pnl (80076-HEPATIC) TLB-CBC Platelet - w/Differential (85025-CBCD) TLB-TSH (Thyroid Stimulating Hormone) (84443-TSH) TLB-T4 (Thyrox), Free [phone])  Problem # 2:  CIGARETTE SMOKER (ICD-305.1) CXR w/o lesions, just COPD/ Emphysema>  must quit all smoking!!! Orders: T-2 View CXR (71020TC)  Problem # 3:  HYPERTENSION (ICD-401.9) BP controlled>  same med. Her updated medication list for this problem includes:    Metoprolol Tartrate 50 Mg Tabs (Metoprolol tartrate) .Marland Kitchen... Take 1 by mouth once daily...    Hyzaar 100-12.5 Mg Tabs (Losartan potassium-hctz) .Marland Kitchen... Take 1 tablet by mouth once  a day  Problem # 4:  HYPERCHOLESTEROLEMIA (ICD-272.0) Chol has been well controlled w/ Lip20... Her updated medication list for this problem includes:    Lipitor 20 Mg Tabs (Atorvastatin calcium) .Marland Kitchen... Take 1 tablet by mouth once a day  Problem # 5:  COLONIC POLYPS (ICD-211.3) GI is stable and up to date...  Problem # 6:  DEGENERATIVE JOINT DISEASE (ICD-715.90) Stable w/ OTC analgesics as needed... Her updated medication list for this problem includes:    Adult Aspirin Low Strength 81 Mg Tbdp (Aspirin) .Marland Kitchen... Take 1 tablet by mouth once a day    Advil 200 Mg Tabs (Ibuprofen) .Marland Kitchen... As needed for pain  Problem # 7:  OSTEOPENIA (ICD-733.90) She takes Calcium, MVI, Vit D, but does not want Bisphos Rx... continue wt bearing exerc...  Problem # 8:  OTHER MEDICAL PROBLEMS AS NOTED>> OK Flu shot...  Complete Medication List: 1)  Travatan 0.004 %  Soln (Travoprost) .... Use as directed 2)  Adult Aspirin Low Strength 81 Mg Tbdp (Aspirin) .... Take 1 tablet by mouth once a day 3)  Metoprolol Tartrate 50 Mg Tabs (Metoprolol tartrate) .... Take 1 by mouth once daily.Marland KitchenMarland Kitchen 4)  Hyzaar 100-12.5 Mg Tabs (Losartan potassium-hctz) .... Take 1 tablet by mouth once a day 5)  Lipitor 20 Mg Tabs (Atorvastatin calcium) .... Take 1 tablet by mouth once a day 6)  Miralax Powd (Polyethylene glycol 3350) .... Take 1 capful in water daily.Marland KitchenMarland Kitchen 7)  Oscal 500/200 D-3 500-200 Mg-unit Tabs (Calcium-vitamin d) .... Take 1 tab daily.Marland KitchenMarland Kitchen 8)  Multivitamins Tabs (Multiple vitamin) .... Take 1 tablet by mouth once a day 9)  Vitamin D3 1000 Unit Tabs (Cholecalciferol) .... Take 1 tablet by mouth once a day 10)  Advil 200 Mg Tabs (Ibuprofen) .... As needed for pain  Other Orders: Flu Vaccine 20yrs + MEDICARE PATIENTS (Z6109) Administration Flu vaccine - MCR (U0454)  Patient Instructions: 1)  Today we updated your med list- see below.... 2)  Continue your current meds the same for now... 3)  Today we did your follow up CXR &  blood work... please call the "phone tree" in a few days for your lab results.Marland KitchenMarland Kitchen  4)  Please please please quit smoking... 5)  Add nutritional supplement like ensure, sustacal, Boost daily.Marland KitchenMarland Kitchen 6)  Call for any questions.Marland KitchenMarland Kitchen 7)  Please schedule a follow-up appointment in 6 months.     Flu Vaccine Consent Questions     Do you have a history of severe allergic reactions to this vaccine? no    Any prior history of allergic reactions to egg and/or gelatin? no    Do you have a sensitivity to the preservative Thimersol? no    Do you have a past history of Guillan-Barre Syndrome? no    Do you currently have an acute febrile illness? no    Have you ever had a severe reaction to latex? no    Vaccine information given and explained to patient? yes    Are you currently pregnant? no    Lot Number:[id number]   Exp Date:02/24/2011   Site Given  Left Deltoid IMu1 Randell Loop CMA  June 21, 2010 10:09 AM

## 2010-09-26 NOTE — Progress Notes (Signed)
Summary: re: sub for ATACAND HCT  Phone Note Call from Patient Call back at [facility] [place] Phone [phone]   Caller: Patient Call For: Charnel Giles Summary of Call: pt received letter from [facility] re: changing her rx for ATACAND HCT to a cheaper med. one suggestion was generic for lisinopril as well as generic for hyzaar. pt wants to know what dr Kriste Basque would suggest for her. pt is aware that dr Kriste Basque is out until monday.  Initial call taken by: Tivis Ringer, CNA,  October 28, 2009 11:42 AM  Follow-up for Phone Call        Please advise. Carron Curie CMA  October 28, 2009 3:28 PM    per SN----SN can ok  hyzaar---losartan/hct  100/12.5mg   1 by mouth once daily   #30 with as needed refills.  thanks Randell Loop CMA  October 31, 2009 2:08 PM   rx sent. pt aware.Carron Curie CMA  October 31, 2009 2:24 PM     New/Updated Medications: HYZAAR 100-12.5 MG TABS (LOSARTAN POTASSIUM-HCTZ) Take 1 tablet by mouth once a day Prescriptions: HYZAAR 100-12.5 MG TABS (LOSARTAN POTASSIUM-HCTZ) Take 1 tablet by mouth once a day  #30 x 11   Entered by:   Carron Curie CMA   Authorized by:   Michele Mcalpine MD   Signed by:   Carron Curie CMA on 10/31/2009   Method used:   Electronically to        [facility] (retail)       [address]       [place], [place]  [id number]       Ph: [phone]       Fax: [phone]   RxID:   [id number]

## 2010-11-02 ENCOUNTER — Other Ambulatory Visit: Payer: Self-pay | Admitting: Pulmonary Disease

## 2010-11-02 DIAGNOSIS — Z1231 Encounter for screening mammogram for malignant neoplasm of breast: Secondary | ICD-10-CM

## 2010-11-22 ENCOUNTER — Ambulatory Visit
Admission: RE | Admit: 2010-11-22 | Discharge: 2010-11-22 | Disposition: A | Discharge status: Home / Self Care | Payer: Medicare Other | Source: Ambulatory Visit | Attending: Pulmonary Disease | Admitting: Pulmonary Disease

## 2010-11-22 DIAGNOSIS — Z1231 Encounter for screening mammogram for malignant neoplasm of breast: Secondary | ICD-10-CM

## 2010-12-19 ENCOUNTER — Encounter: Payer: Self-pay | Admitting: Pulmonary Disease

## 2010-12-21 ENCOUNTER — Telehealth: Payer: Self-pay | Admitting: Internal Medicine

## 2010-12-21 ENCOUNTER — Ambulatory Visit (INDEPENDENT_AMBULATORY_CARE_PROVIDER_SITE_OTHER): Payer: Medicare Other | Admitting: Pulmonary Disease

## 2010-12-21 ENCOUNTER — Encounter: Payer: Self-pay | Admitting: Pulmonary Disease

## 2010-12-21 DIAGNOSIS — R1013 Epigastric pain: Secondary | ICD-10-CM

## 2010-12-21 DIAGNOSIS — I10 Essential (primary) hypertension: Secondary | ICD-10-CM

## 2010-12-21 DIAGNOSIS — F17200 Nicotine dependence, unspecified, uncomplicated: Secondary | ICD-10-CM

## 2010-12-21 DIAGNOSIS — F411 Generalized anxiety disorder: Secondary | ICD-10-CM

## 2010-12-21 DIAGNOSIS — M1990 Unspecified osteoarthritis, unspecified site: Secondary | ICD-10-CM

## 2010-12-21 DIAGNOSIS — K649 Unspecified hemorrhoids: Secondary | ICD-10-CM

## 2010-12-21 DIAGNOSIS — M899 Disorder of bone, unspecified: Secondary | ICD-10-CM

## 2010-12-21 DIAGNOSIS — E7800 Pure hypercholesterolemia, unspecified: Secondary | ICD-10-CM

## 2010-12-21 DIAGNOSIS — K3189 Other diseases of stomach and duodenum: Secondary | ICD-10-CM

## 2010-12-21 DIAGNOSIS — M949 Disorder of cartilage, unspecified: Secondary | ICD-10-CM

## 2010-12-21 NOTE — Telephone Encounter (Signed)
Left a message for Rhonda to call me back. 

## 2010-12-21 NOTE — Telephone Encounter (Signed)
Spoke with Bjorn Loser and she wants patient to be called to r/s. Spoke with patient and she would like to schedule for next week with an extender. Scheduled with Mike Gip, PA on 12/25/10 at 9:30 AM.

## 2010-12-21 NOTE — Patient Instructions (Addendum)
Today we updated your med list in our new EPIC system...    Continue your current meds the same...  Please return to our lab one morning soon for your fasting blood work...    Then call the PHONE TREE in a few days for your results...    Dial [id number] & when prompted enter your patient number followed by the # symbol...    Your patient number is:  [id number]  We will arrange for a follow up visit w/ Dr Lina Sar in GI to evaluate your Hemorrhoid problem...  Call for any questions... Let's plan another follow up visit in 6 months.Karen KitchenMarland Carlson

## 2010-12-21 NOTE — Progress Notes (Signed)
Subjective:    Patient ID: [name], female    DOB: 06-15-1934, 77 y.o.   MRN: [id number]  HPI [name] here for a follow up visit... she has HBP, Chol, DJD, and mult med problems as noted below...   ~  December 19, 2009:  she's been getting the "muscle therapy" in [place], now on home exercises... still smoking & we discussed smoking cessation strategies (CXR 10/10 showed NAD).Marland Kitchen. BP controlled & doing satis w/ switch to Losartan... she is due for FASTING blood work & BMD & will ret to our lab for these tests... we discussed Rx for her RLS...  ~  June 21, 2010:  she has lost 10# down to 133# but notes her appetite is good, denies pain anywhere, no localizing signs or symptoms- CXR w/ COPD, NAD; and labs OK... still smokes ~1/2 ppd, denies much cough & no sputum or hemoptysis etc... no CP, palpit, or ch in DOE... BP controlled on meds;  Chol has been good on Lip20;  GI somewhat improved after her muscle therapy;  BMD checked 5/11 w/ osteopenia- on Calcium, MVI Vit D... OK Flu shot today, she will try Ensure.  ~  December 21, 2010:  8mo ROV & she reports 2 cataract surgeries by DrShapiro followed by ?retinal surg by DrRankin (we don't have records);  Her chief complaint is hemorrhoids which she says are no better after suppos Rx & AnusolHC cream Rx> [facility] having seen advert on TV for his hem rx, but we decided to f/u w/ DrDBrodie here;  Still smoking but down to <1/2ppd she says, classic chr bronchitic AM cough/ phlegm & we discussed Mucinex prn (she doesn't want smoking cessation help or breathing meds "my breathing is fine");  BP controlled on meds- denies CP, palpit, dizzy, edema, etc;  Due for fasting blood work & she will ret for this> all normal...         Problem List:  FATIGUE (ICD-780.79) - she rests OK, wakes tired most days, no daytime hypersomnolence...  GLAUCOMA (ICD-365.9) - on eye drops per ophthalmology;  Had bilat cat surg by DrShapiro, then  required retinal surg by DrRankin (we don't have records).  CIGARETTE SMOKER (ICD-305.1) - still smokes ~1/2ppd & must quit, she knows the risks & not interested in smoking cessation help... ~  CXR 10/07 showed sl hyperinflation, mild incr Cor... ~  CXR in ER 10/09 showed COPD, bibasilar scarring, NAD.Marland Kitchen. ~  CXR 10/10 showed similar findings- scarring, NAD.Marland Kitchen. ~  CXR 10/11 showed COPD/ Emphysema, bibasilar scarring, DJD sp, NAD.  HYPERTENSION (ICD-401.9) - controlled on ASA 81mg /d, TOPROL XL 50mg /d & LOSARTAN/HCT 100-12.5 daily..  ~  10/11:  BP= 134/76 & she tells me that she sometimes takes these meds Qod and that she feels better on this schedule & wishes to continue these same meds in this manner... ~  4/12:  BP= 140/70 & she denies HA, CP, palpit, SOB, edema, etc...  HYPERCHOLESTEROLEMIA (ICD-272.0) - on LIPITOR 20mg /d... ~  FLP 4/09 showed TChol 181, TG 73, HDL 54, LDL 112 ~  FLP 4/10 showed TChol 168, TG 63, HDL 53, LDL 102 ~  FLP 4/11 showed TChol 169, TG 64, HDL 57, LDL 100 ~  FLP 4/12 showed TChol 163, TG 52, HDL 70, LDL 83... Continue Lip20.  DYSPEPSIA (ICD-536.8) - uses PRILOSEC OTC 20mg /d Prn...  DIVERTICULOSIS OF COLON (ICD-562.10) - she alternates MIRALAX & METAMUCIL Qod... COLONIC POLYPS (ICD-211.3) - last colonoscopy by  DrBrodie 6/07 showed divertics, hems... f/u planned 98yrs. Family Hx of COLON CANCER (ICD-153.9) HEMORRHOIDS (ICD-455.6) - "Hemorrhoids are my biggest problem" > we discussed f/u GI- DrDBrodie... OTHER SPECIFIED DISORDER OF RECTUM AND ANUS (ICD-569.49) -  - eval by DrToth & referred to [place] DrFuller for painful defecation... he recommended muscle retraining but initially her insurance wouldn't cover this, later they did cover & she had the "muscle therapy" now doing home therapy...  RENAL CYST (ICD-593.2) - Abd Sonar 11/09 showed sm bilat renal cysts...  DEGENERATIVE JOINT DISEASE (ICD-715.90) - s/p right THR 3/05 by DrBeane... she also has a known lumbar  spondylosis w/ right leg radiculopathy... she states that exercise is helping... ~  10/10: c/o knee pain- she declines Rheum/ Ortho referral... continue OTC meds (Advil) Prn...  OSTEOPENIA (ICD-733.90) & VITAMIN D DEFICIENCY (ICD-268.9) -  ~  labs 4/10 showed Vit D level = 23 & started on Vit D 1000 u OTC daily... ~  BMD 5/11 showed TScores -1.2 in [facility], and -1.6 in [facility]... on Calcium, MVI, Vit D. ~  labs 5/11 showed Vit D level = 41... continue 1000 u daily.  Hx of RESTLESS LEGS SYNDROME (ICD-333.94) - she tried Requip 1mg  Qhs in the past> now off...  ANXIETY (ICD-300.00) - she states that she stopped the Alprazolam (prev 0.5mg  Prn)... ~  10/09 states depressed- 2 sisters died 01-17-2023- one w/ ca pancreas, other w/ DM/ ASPVD.Marland Kitchen. tried Zofoft transiently then stopped.  HEALTH MAINTENANCE:  she sees DrMezer et al for GYN, neg mammogram 3/12... she gets the yearly seasonal Flu vaccines, and had the PNEUMOVAX in 2005 at age 72...   Past Surgical History  Procedure Date  . Total hip arthroplasty 2/05    right by Dr. Shelle Iron  . Cataract extraction   . Upper gastrointestinal endoscopy     Outpatient Encounter Prescriptions as of 12/21/2010  Medication Sig Dispense Refill  . aspirin 81 MG tablet Take 81 mg by mouth daily.        Marland Kitchen atorvastatin (LIPITOR) 20 MG tablet Take 20 mg by mouth daily.        . calcium-vitamin D (OSCAL WITH D) 500-200 MG-UNIT per tablet Take 1 tablet by mouth daily.        . Cholecalciferol (VITAMIN D3) 1000 UNITS CAPS Take 1 capsule by mouth daily.        Marland Kitchen ibuprofen (ADVIL) 200 MG tablet as needed.        Marland Kitchen levobunolol (BETAGAN) 0.5 % ophthalmic solution Place 1 drop into both eyes at bedtime.        Marland Kitchen losartan-hydrochlorothiazide (HYZAAR) 100-12.5 MG per tablet Take 1 tablet by mouth daily.        . metoprolol (TOPROL-XL) 50 MG 24 hr tablet Take 50 mg by mouth daily.        . Multiple Vitamin (MULTIVITAMIN) tablet Take 1 tablet by mouth daily.        . polyethylene  glycol powder (MIRALAX) powder Take 17 g by mouth daily.        . travoprost, benzalkonium, (TRAVATAN) 0.004 % ophthalmic solution as directed.         PRILOSEC> 20mg  OTC  Take 1 as needed for acid...      No Known Allergies   Review of Systems         See HPI - all other systems neg except as noted... The patient complains of dyspnea on exertion.  The patient denies anorexia, fever, weight loss, weight gain, vision loss,  decreased hearing, hoarseness, chest pain, syncope, peripheral edema, prolonged cough, headaches, hemoptysis, abdominal pain, melena, hematochezia, severe indigestion/heartburn, hematuria, incontinence, muscle weakness, suspicious skin lesions, transient blindness, difficulty walking, depression, unusual weight change, abnormal bleeding, enlarged lymph nodes, and angioedema.     Objective:   Physical Exam     WD, WN, [name] in NAD... GENERAL:  Alert & oriented; pleasant & cooperative. HEENT:  [place]/AT, EOM-wnl, PERRLA, EACs-clear, TMs-wnl, NOSE-clear, THROAT-clear & wnl. NECK:  Supple w/ fairROM; no JVD; normal carotid impulses w/o bruits; no thyromegaly or nodules palpated; no lymphadenopathy. CHEST:  Clear to P & A; without wheezes/ rales/ or rhonchi. HEART:  Regular Rhythm;  gr 1/6 SEM without rubs or gallops heard... ABDOMEN:  Soft & nontender; normal bowel sounds; no organomegaly or masses detected. EXT: without deformities, mod arthritic changes; no varicose veins/ +venous insuffic/ tr edema. NEURO:  CN's intact;  no focal neuro deficits... DERM:  No lesions noted; no rash etc...   Assessment & Plan:           SMOKER>  We again reviewed the importance of smoking cessation, she declines smoking cessation help...  HBP>  Controlled on BBlocker, ARB, Diuretic;  Continue same + no salt etc...  CHOL>  She remains stable on the Lip20;  Continue same + diet/ exercise...  DYSPEPSIA>  Continue Prilosec OTC Prn...  HEM's>  We will set her up an appt to see  DrDBrodie/ GI for this eval & Rx...  DJD/ Osteopenia>  As noted right THR 2005, known LBP/ spondylosis; c/o knee pain on & off & uses OTC analgesics prn...  Anxiety> she declines anxiolytic Rx.Marland KitchenMarland Kitchen

## 2010-12-25 ENCOUNTER — Other Ambulatory Visit (INDEPENDENT_AMBULATORY_CARE_PROVIDER_SITE_OTHER): Payer: Medicare Other

## 2010-12-25 ENCOUNTER — Encounter: Payer: Self-pay | Admitting: Physician Assistant

## 2010-12-25 ENCOUNTER — Ambulatory Visit (INDEPENDENT_AMBULATORY_CARE_PROVIDER_SITE_OTHER): Payer: Medicare Other | Admitting: Physician Assistant

## 2010-12-25 VITALS — BP 144/90 | HR 68 | Ht 66.0 in | Wt 129.0 lb

## 2010-12-25 DIAGNOSIS — Z800 Family history of malignant neoplasm of digestive organs: Secondary | ICD-10-CM

## 2010-12-25 DIAGNOSIS — I10 Essential (primary) hypertension: Secondary | ICD-10-CM

## 2010-12-25 DIAGNOSIS — E7800 Pure hypercholesterolemia, unspecified: Secondary | ICD-10-CM

## 2010-12-25 DIAGNOSIS — K6289 Other specified diseases of anus and rectum: Secondary | ICD-10-CM

## 2010-12-25 DIAGNOSIS — R1013 Epigastric pain: Secondary | ICD-10-CM

## 2010-12-25 DIAGNOSIS — F411 Generalized anxiety disorder: Secondary | ICD-10-CM

## 2010-12-25 DIAGNOSIS — Z860101 Personal history of adenomatous and serrated colon polyps: Secondary | ICD-10-CM

## 2010-12-25 DIAGNOSIS — Z9889 Other specified postprocedural states: Secondary | ICD-10-CM

## 2010-12-25 DIAGNOSIS — Z860100 Personal history of colon polyps, unspecified: Secondary | ICD-10-CM

## 2010-12-25 DIAGNOSIS — Z8719 Personal history of other diseases of the digestive system: Secondary | ICD-10-CM

## 2010-12-25 LAB — HEPATIC FUNCTION PANEL
ALT: 21 U/L (ref 0–35)
AST: 21 U/L (ref 0–37)
Alkaline Phosphatase: 63 U/L (ref 39–117)
Bilirubin, Direct: 0.1 mg/dL (ref 0.0–0.3)
Total Protein: 6.3 g/dL (ref 6.0–8.3)

## 2010-12-25 LAB — BASIC METABOLIC PANEL
CO2: 27 mEq/L (ref 19–32)
Calcium: 9.3 mg/dL (ref 8.4–10.5)
Chloride: 104 mEq/L (ref 96–112)
Potassium: 4.2 mEq/L (ref 3.5–5.1)
Sodium: 138 mEq/L (ref 135–145)

## 2010-12-25 LAB — CBC WITH DIFFERENTIAL/PLATELET
Basophils Absolute: 0 10*3/uL (ref 0.0–0.1)
Eosinophils Absolute: 0.2 10*3/uL (ref 0.0–0.7)
Hemoglobin: 13.5 g/dL (ref 12.0–15.0)
Lymphocytes Relative: 35.4 % (ref 12.0–46.0)
MCHC: 34.5 g/dL (ref 30.0–36.0)
Neutro Abs: 2.3 10*3/uL (ref 1.4–7.7)
RDW: 13.7 % (ref 11.5–14.6)

## 2010-12-25 LAB — LIPID PANEL: Total CHOL/HDL Ratio: 2

## 2010-12-25 MED ORDER — PRAMOXINE-HC 1-1 % EX CREA: TOPICAL_CREAM | CUTANEOUS | Status: AC

## 2010-12-25 MED ORDER — PEG-KCL-NACL-NASULF-NA ASC-C 100 G PO SOLR: ORAL | Status: DC

## 2010-12-25 NOTE — Progress Notes (Signed)
Subjective:    Patient ID: [name], female    DOB: 22-Feb-1934, 77 y.o.   MRN: [id number]  HPI [name] is a very nice 77 year old F. American female known to Dr. Bernette Mayers . She does have history of adenomatous colon polyps and family history of colon cancer in her brother. She also has history of GERD and  prior dilation of an esophageal stricture. She has chronic problems with constipation and rectal discomfort. She was last seen in 2010 at that time had surgical referral to Dr. Carolynne Edouard for evaluation of internal hemorrhoids. He   did not feel that her internal hemorrhoids were significant enough to require surgery and then referred her to Dr. Toni Arthurs in [place] for evaluation and she was sent to biofeedback training. She says she did not feel that this made any difference in that she has had ongoing daily symptoms with an internal rectal pressure sensation it is uncomfortable. With the use of alternating Metamucil and MiraLax she is having one to 2 bowel movements per day, she says this does involve some straining but she's not having hard stools. She sees occasional pinkish fresh blood on the tissue but has not been having any other rectal bleeding. She denies any abdominal pain. She is quite frustrated with the ongoing chronic nature of her rectal discomfort and I would like something definitive done. She has used  suppositories off and on long term which does she does not think helps at all.  Last colonoscopy was done in 2007 and showed extensive diverticulosis of the left and right colon predominantly in the sigmoid and grade 1 internal hemorrhoids. She did not have any polyps.  She has no current complaints of dysphagia or odynophagia or ongoing heartburn.    Review of Systems  Constitutional: Negative.   HENT: Negative.   Eyes: Negative.   Respiratory: Negative.   Cardiovascular: Negative.   Gastrointestinal: Positive for constipation and rectal pain.  Genitourinary: Negative.     Musculoskeletal: Negative.   Skin: Negative.   Neurological: Negative.   Hematological: Negative.   Psychiatric/Behavioral: Negative.        Objective:   Physical Exam Well-developed thin elderly African American female in no acute distress, pleasant, alert and oriented x3 HEENT not reticulocyte normocephalic EOMI PERRLA sclera anicteric Neck; Supple no JVD Cardiovascular; regular rate and rhythm with S1 and S2 no murmur rub or gallop  Pulmonary; clear bilaterally Abdomen; flat soft nontender bowel sounds active no palpable mass or hepatosplenomegaly  Rectal exam, external hemorrhoidal tissue noninflamed, she appears to have some mild rectal prolapse with reddish rectal tissue visible just beyond the anus, she is tender with fullness on rectal exam no internal hemorrhoids felt, stool is Hemoccult negative.  Skin; warm and dry benign no obvious lesions  Psych ;mood and affect normal and appropriate        Assessment & Plan:  #34 77 year old female with chronic rectal discomfort and pressure, likely secondary to a mild rectal prolapse.  Plan; She is due for followup colonoscopy for colon screening and this will be scheduled with Dr. Lina Sar at which time she can also have closer inspection of the ano- rectum. She may benefit from referral to colorectal surgeon for consideration of repair of the mild prolapse Try a trial of Analpram cream 2-3 times daily. Continue alternating days of Metamucil and MiraLax use.  #2 Family history of colon cancer in the patient's brother Followup colonoscopy scheduled as above  #3 Personal history of adenomatous colon polyps Last  colonoscopy 01/2006- no polyps  #4 diverticulosis  #5 History of esophageal stricture status post dilation 2009 Currently asymptomatic.

## 2010-12-26 ENCOUNTER — Encounter: Payer: Self-pay | Admitting: Physician Assistant

## 2010-12-26 ENCOUNTER — Other Ambulatory Visit: Payer: Self-pay | Admitting: Physician Assistant

## 2010-12-26 NOTE — Telephone Encounter (Signed)
Error

## 2010-12-26 NOTE — Telephone Encounter (Signed)
Spoke to pharmacist and she said they had the prescription for the Moviprep but not the Analpram.  I gave the pharmacist the prescription for Analpram HC ( Generic), 30 gram tube with 1 refill. The pt is to use it rectally 2-3 times daily.  She said she would let the patient know.

## 2010-12-27 NOTE — Progress Notes (Signed)
Agree with evaluation and management as per Ms. Esterwood's note.  Iva Boop, MD, [facility]

## 2010-12-29 ENCOUNTER — Encounter: Payer: Self-pay | Admitting: Pulmonary Disease

## 2011-01-03 ENCOUNTER — Encounter: Payer: Self-pay | Admitting: Internal Medicine

## 2011-01-04 ENCOUNTER — Ambulatory Visit (AMBULATORY_SURGERY_CENTER): Payer: Medicare Other | Admitting: Internal Medicine

## 2011-01-04 ENCOUNTER — Encounter: Payer: Self-pay | Admitting: Internal Medicine

## 2011-01-04 VITALS — BP 178/90 | HR 70 | Temp 95.9°F | Resp 14 | Ht 66.0 in | Wt 125.0 lb

## 2011-01-04 DIAGNOSIS — R109 Unspecified abdominal pain: Secondary | ICD-10-CM

## 2011-01-04 DIAGNOSIS — K625 Hemorrhage of anus and rectum: Secondary | ICD-10-CM

## 2011-01-04 DIAGNOSIS — K921 Melena: Secondary | ICD-10-CM

## 2011-01-04 DIAGNOSIS — K5730 Diverticulosis of large intestine without perforation or abscess without bleeding: Secondary | ICD-10-CM

## 2011-01-04 DIAGNOSIS — K623 Rectal prolapse: Secondary | ICD-10-CM

## 2011-01-04 DIAGNOSIS — Z86010 Personal history of colonic polyps: Secondary | ICD-10-CM

## 2011-01-04 MED ORDER — HYDROCORTISONE 2.5 % EX CREA: TOPICAL_CREAM | Freq: Two times a day (BID) | CUTANEOUS | Status: DC

## 2011-01-04 MED ORDER — HYDROCORTISONE ACETATE 25 MG RE SUPP: 25.0000 mg | Freq: Every day | RECTAL | Status: DC

## 2011-01-04 MED ORDER — SODIUM CHLORIDE 0.9 % IV SOLN: 500.0000 mL | INTRAVENOUS | Status: DC

## 2011-01-04 NOTE — Patient Instructions (Addendum)
SEE REPORT AND BLUE AND GREEN SHEETS FOR ADDITIONAL D/C INSTRUCTIONS.  YOU RECEIVED A PRESCRIPTION FOR ANUSOL SUPPOSITORIES AND A PRESCRIPTION FOR A CREAM WAS SENT TO YOUR PHARMACY ([facility])

## 2011-01-04 NOTE — Progress Notes (Signed)
There is a HIPPA sign hanging on the IV pole.  I made Dr. Juanda Chance aware of this.  Pt has a female friend with her today as her care partner.  She dose not want him to know her results.  MAW  Pt was uncomfortable while the scope was advanced.  We had difficulty reaching the cecum.  Pt was turned supine and abd pressure used by the tech to aid scope advancement.  The cecum was reached.  The pt relaxed on the way out of the cecum and rested comfortably.  MAW

## 2011-01-04 NOTE — Progress Notes (Signed)
REPORT AND D/C INSTRUCTIONS PUT IN A SEALED ENVELOPE AND GIVEN TO PT.

## 2011-01-05 ENCOUNTER — Telehealth: Payer: Self-pay | Admitting: *Deleted

## 2011-01-05 NOTE — Telephone Encounter (Signed)

## 2011-01-06 ENCOUNTER — Other Ambulatory Visit: Payer: Self-pay | Admitting: Pulmonary Disease

## 2011-01-08 ENCOUNTER — Telehealth: Payer: Self-pay | Admitting: Internal Medicine

## 2011-01-08 NOTE — Telephone Encounter (Signed)
Spoke to pharmacist and gave verbal orders for Hydrocortisone suppositories. Patient advised of this as well.

## 2011-01-12 NOTE — Discharge Summary (Signed)
[name], [name]                         ACCOUNT NO.:  [id number]   MEDICAL RECORD NO.:  [id number]                   PATIENT TYPE:  LINP   LOCATION:                                       FACILITY:  [facility]   PHYSICIAN:  Jene Every, M.D.                 DATE OF BIRTH:  Feb 02, 1934   DATE OF ADMISSION:  10/20/2003  DATE OF DISCHARGE:  10/26/2003                                 DISCHARGE SUMMARY   DATE DISCHARGED TO REHABILITATION:  October 26, 2003.   ADMISSION DIAGNOSES:  1. Degenerative joint disease right hip.  2. Hypertension.  3. Hyperlipidemia.  4. Diverticulosis.  5. History of colon polyps.  6. Glaucoma.  7. History of dizziness associated with peripheral neuropathy.   DISCHARGE DIAGNOSES:  1. Degenerative joint disease right hip status post right total hip     arthroplasty.  2. Hypertension.  3. Hyperlipidemia.  4. Diverticulosis.  5. History of colon polyps.  6. Glaucoma.  7. History of dizziness associated with peripheral neuropathy.  8. Resolved hyponatremia, hypokalemia with a fever of unknown origin.   PROCEDURE:  The patient was taken to the OR on February 25th to undergo a  right total hip arthroplasty.  Surgeon:  Jene Every, M.D.  Assistant:  Ronnell Guadalajara, M.D.  Anesthesia:  General.  Complications:  None.  The  patient did receive 1 unit of packed red blood cells in the OR.   CONSULTATIONS:  PT, OT, rehab.   HISTORY:  [name] is a 77 year old female with a longstanding history of  right hip pain with x-rays that revealed severe end-stage of osteoarthritis  of the right hip.  The patient has declined surgical intervention for quite  some time.  She was treated conservatively with anti-inflammatories as well  as a home exercise program and periodic injections into the hip.  However,  the patient's pain tolerance had gotten fairly disabling and conservative  treatment had failed.  It was felt she would benefit from a total hip  arthroplasty.   The risks and benefits of the surgery were discussed with the  patient by Dr. Shelle Iron.  She wishes to proceed.  The patient did receive  medical clearance from Alroy Dust, M.D. at [facility] prior to  proceeding with her surgery.   LABORATORY DATA:  Preoperative labs show a hemoglobin 13.8, hematocrit 48.1.  The patient did have a drop in her hemoglobin at time of discharge to rehab  with a hemoglobin of 10.2, hematocrit 30.2.  A coagulation study done  preoperatively showed PT 12.6, INR 0.9.  These were followed throughout the  hospital course.  At time of discharge to rehab PT was 19.8 and therapeutic  INR at 2.1.  Routine chemistries done preoperatively showed sodium 142,  potassium 4, glucose of 87.  Routine chemistries were followed which did  show a slight drop in her sodium to a level of  133, drop in potassium to  3.3.  However, at time of discharge had resolved to a sodium of 135,  potassium of 3.5.  Routine liver function tests were normal.  Urinalysis  done preoperatively showed trace hemoglobin, otherwise negative.  Repeat  urinalysis done while hospitalized showed cloudy urine, small hemoglobin,  small leukocyte esterase; however, only 0-2 wbc's/rbc's seen per high-power  field.  Blood type was O positive.  Urine culture obtained during  hospitalization showed no growth.  EKG taken preoperatively showed normal  sinus rhythm, left atrial enlargement, nonspecific T wave abnormalities.  Repeat EKG done while the patient was hospitalized showed sinus tachycardia  otherwise no specific changes since last tracing.  I do not see a chest x-  ray report in her chart.   HOSPITAL COURSE:  [name] was taken to the OR and underwent the above-  stated procedure without difficulty.  She was then transferred to the PACU  and then to the orthopedic floor for continued postoperative care.  Coumadin  was started for DVT prophylaxis, dose per the pharmacy.  Postoperatively the   patient did fairly well.  She did advance slowly with physical therapy.  Also of note is a slight temperature.  Incentive spirometer was encouraged.  Urinalysis was obtained.  The patient was treated prophylactically with an  antibiotic for a UTI.  She did have episodes of hyponatremia and  hypokalemia.  This was treated accordingly which resolved.  PCA was  discontinued.  The patient continued to do well with p.o. analgesics.  A  repeat EKG was checked which showed slight sinus tachycardia otherwise no  significant changes from preoperative reading.  Wound remained clean, dry,  and intact with no signs of infection.  Rehab was consulted for admission.  On postoperative day #4, it was felt the patient was able to be discharged  to rehab for continued PT/OT as well as monitoring of her medical issues.  Care was transferred over to rehab.   DISPOSITION:  On postoperative day #4, the patient was stable and was able  to be discharged to rehab for continued care.     Roma Schanz, P.A.                   Jene Every, M.D.    CS/MEDQ  D:  11/03/2003  T:  11/04/2003  Job:  [id number]

## 2011-01-12 NOTE — Discharge Summary (Signed)
[name], [name]                       ACCOUNT NO.:  [id number]   MEDICAL RECORD NO.:  [id number]                   PATIENT TYPE:  IPS   LOCATION:  4143                                 FACILITY:  [facility]   PHYSICIAN:  Ellwood Dense, M.D.                DATE OF BIRTH:  1934/02/14   DATE OF ADMISSION:  10/26/2003  DATE OF DISCHARGE:  11/02/2003                                 DISCHARGE SUMMARY   DISCHARGE DIAGNOSES:  1. Right total hip replacement.  2. Hypertension.  3. Glaucoma.  4. Constipation, resolved.   HISTORY OF PRESENT ILLNESS:  [name] is a 77 year old female  with history of hypertension, dyslipidemia, right hip pain secondary to OA.  She elected to undergo right total hip replacement February 25 by Dr. Shelle Iron.  Postoperative is partial weight bearing and on Coumadin for DVT prophylaxis.  She had issues with electrolyte abnormalities post surgery as well as  elevated __________ with question of UTI. She was started on Cipro for  treatment. PT/OT initiated inpatient; had supervision for upper body care  with assistive device for low body care, min assist for transfers, min  assist for ambulating 12 feet x2. Requires cues for safety.   PAST MEDICAL HISTORY:  1. Significant for hypertension.  2. Diverticulosis.  3. Colon polyps.  4. DJD.  5. Dyslipidemia.  6. Glaucoma.  7. Dizziness.  8. Peripheral neuropathy.  9. Nocturia x3.   ALLERGIES:  No known drug allergies.   SOCIAL HISTORY:  The patient lives alone. Was independent and active prior  to admission. She lives in a one level home with two steps to entry. She  does not use any tobacco, quit a week prior to admission, and does not use  any alcohol.   HOSPITAL COURSE:  [name] was admitted to rehab on October 26, 2003  for inpatient therapies to consistent of PT/OT daily. Prior to this  admission, she was maintained on Coumadin for DVT prophylaxis. Labs checked  post admission showed  her postoperative anemia to be stable with hemoglobin  11.0, hematocrit 32.0, white count 6.4, platelets 194; sodium 139, potassium  3.9, chloride 106, CO2 26, BUN 11, creatinine 0.9, glucose 103. The  patient's hip incision was noted to be healing well without any signs or  symptoms of infection. No drainage, no erythema noted. Staples were  discontinued and the area steri stripped on postoperative day #10 without  difficulty. The patient has had some problems with constipation that was  treated with use of laxatives. Her blood pressure was controlled throughout  the stay, and pain control was reasonable on p.r.n. medications. During her  stay in rehab, the patient progressed to be modified independent for  transfers, modified independent for ambulating 100 to 120 feet with a  rolling walker. She requires supervision for navigating stairs. She is  modified independent for ADLs including toileting and simple home management  tasks.  Further followup therapies to include home health PT/OT by [facility]  [facility] health services with home health RN arranged to draw protime. The  patient to continue on Coumadin through March 25 with next protime to be  checked on March 10. On November 02, 2003, the patient is discharged to home.   DISCHARGE MEDICATIONS:  1. Coumadin 3 mg two p.o. q.p.m.  2. Trinsicon one p.o. b.i.d.  3. Toprol-XL 50 mg a day.  4. Atacand/hydrochlorothiazide one per day.  5. Xalatan one drop OU q.h.s.  6. Betagan one drop OU b.i.d.  7. Oxycodone IR 5 to 10 mg q.4-6h. p.r.n. pain.   ACTIVITY:  Partial weight bearing right lower extremity. Follow right total  hip precautions. Use walker.   WOUND CARE:  Keep area clean and dry.   SPECIAL INSTRUCTIONS:  No aspirin or aspirin products while on Coumadin. No  alcohol, no smoking, no driving. Followup with Dr. Shelle Iron for postoperative  check in the next two to three weeks. Followup with Dr. Kriste Basque for routine  check.      Greg Cutter, P.A.                    Ellwood Dense, M.D.    PP/MEDQ  D:  11/02/2003  T:  11/03/2003  Job:  [id number]   cc:   Lonzo Cloud. Kriste Basque, M.D. [facility]   Jene Every, M.D.  [address]  [place]  [place] [address]  Fax: [phone]

## 2011-01-12 NOTE — H&P (Signed)
NAMEBellany, [name] NO.:  [id number]   MEDICAL RECORD NO.:  [id number]                   PATIENT TYPE:   LOCATION:                                       FACILITY:   PHYSICIAN:  Jene Every, M.D.                 DATE OF BIRTH:  [birth year]   DATE OF ADMISSION:  10/20/2003  DATE OF DISCHARGE:                                HISTORY & PHYSICAL   CHIEF COMPLAINT:  Right hip pain.   HISTORY:  The patient is a 77 year old female with a long-standing history  of right hip pain with x-rays demonstrating severe end-stage osteoarthritis  of the hip.  The patient had declined surgical intervention for many years.  She was treated conservatively with anti-inflammatories, as well as home  exercise program, periodic injections into the hip, however, the pain had  gotten to the point where it is severely disabling.  The risks and benefits  of the surgery were discussed with the patient in detail by Dr. Shelle Iron and  she wishes to proceed.  Medical clearance was obtained from Dr. Alroy Dust at [facility].   PAST MEDICAL HISTORY:  1. Hypertension.  2. Hyperlipidemia.  3. Diverticulosis.  4. Colon polyps.  5. Glaucoma.  6. History of dizziness and peripheral neuropathy.   MEDICATIONS:  1. Toprol XL 50 mg one p.o. daily.  2. Aspirin daily.  3. Atacand 16 mg with HCT one p.o. daily.  4. Lipitor 10 mg one p.o. q.h.s.  5. Multivitamin with calcium daily.  6. Glaucoma eye drops.  7. Mobic 7.5 mg one p.o. daily.   ALLERGIES:  No known drug allergies.   PAST SURGICAL HISTORY:  None.   SOCIAL HISTORY:  The patient is widowed and retired.  She smokes  approximately one to two cigarettes per day, however, she has recently  stopped.  She denies any alcohol intake.  She lives in a one story home with  three entry stairs.   FAMILY HISTORY:  Sister has a history of diabetes, osteoarthritis, bone  cancer.   REVIEW OF SYSTEMS:  GENERAL:  The patient denies  any fever, chills, night  sweats, or bleeding tendencies.  CNS:  No blurry or double vision, seizure,  headache, or paralysis.  RESPIRATORY:  No productive cough, shortness of  breath, or hemoptysis.  CARDIOVASCULAR:  No chest pain, angina, or  orthopnea.  GENITOURINARY:  No dysuria, hematuria, or discharge.  GASTROINTESTINAL:  No nausea, vomiting, diarrhea, constipation, melena, or  bloody stools.  MUSCULOSKELETAL:  Pertinent to HPI.   PHYSICAL EXAMINATION:  VITAL SIGNS:  Pulse is 76, respiratory rate 16, blood  pressure 160/106 this is taken in both upper extremities.  GENERAL:  This is an elderly 78 year old black female in no acute distress.  She does walk with an antalgic gait.  HEENT:  Normocephalic, atraumatic.  Pupils equal, round, reactive to light.  NECK:  Supple, no lymphadenopathy.  CHEST:  Clear to auscultation bilaterally.  No wheezes, rhonchi, or rales.  HEART:  Regular rate and rhythm without murmurs, rubs, or gallops.  ABDOMEN:  Soft, nontender, nondistended, bowel sounds x4.  BREASTS:  Not examined, not pertinent to HPI.  GENITOURINARY:  Not examined, not pertinent to HPI.  SKIN:  No rashes or lesions are noted.  The patient does have some mild  peripheral edema with some venous insufficiency noted.  EXTREMITIES:  She has a painful range of motion of the right hip with  shortening of the right lower extremity.   IMPRESSION:  1. Degenerative joint disease, right hip.  2. Hypertension.  3. Hyperlipidemia.  4. Diverticulosis.  5. Colon polyps.  6. Glaucoma.   PLAN:  The patient will be admitted to [facility] to undergo right  total hip arthroplasty.  I will inform Dr. Kriste Basque of her room number on  admission.     Roma Schanz, P.A.                   Jene Every, M.D.    CS/MEDQ  D:  10/11/2003  T:  10/11/2003  Job:  [id number]

## 2011-01-12 NOTE — Op Note (Signed)
[name], [name]                       ACCOUNT NO.:  [id number]   MEDICAL RECORD NO.:  [id number]                   PATIENT TYPE:  INP   LOCATION:  X002                                 FACILITY:  [facility]   PHYSICIAN:  Jene Every, M.D.                 DATE OF BIRTH:  1934/06/11   DATE OF PROCEDURE:  DATE OF DISCHARGE:                                 OPERATIVE REPORT   PREOPERATIVE DIAGNOSIS:  Degenerative joint disease, right hip.   POSTOPERATIVE DIAGNOSIS:  Degenerative joint disease, right hip.   PROCEDURE:  Right total hip arthroplasty.   ANESTHESIA:  General.   ASSISTANT:  Philips Montez Morita, M.D.   BRIEF HISTORY AND INDICATIONS:  A 77 year old with end-stage osteoarthritis  of the hip on the right.  Operative intervention is indicated for  replacement.  The risks and benefits were discussed including bleeding,  infection, damage to vascular structures, DVT, PE, dislocation, need for  revision, leg length discrepancy, etc.   TECHNIQUE:  With the patient in supine position after induction adequate  general endotracheal anesthesia and 1 g of Kefzol, patient was placed in the  left lateral decubitus position.  All bony prominences were well padded.  The right hip and peritrochanteric regions were prepped and draped in the  usual sterile fashion.  An incision was made over the greater trochanter.  A  posterolateral approach was utilized.  The subcutaneous tissue was  dissected.  Electrocautery achieved hemostasis.  The fascia lata was  identified and divided in line with the skin incision.  Self-retaining  retractors were placed.  The bursa was excised.  The fascia lata was  identified and divided in line with the skin incision.  Adductor tenotomy  performed.  External rotators identified.  Piriformis identified, detached  from its insertion, and then tagged and reflected posteriorly to protect the  sciatic nerve throughout the case.  The external rotators remaining were  detached as well.  A T-shaped capsulotomy was performed, copious synovial  fluid was evacuated.  I then dislocated the hip.  Severe osteoarthritic  changes were noted.  An oscillating saw was utilized to perform femoral  osteotomy of the neck one fingerbreadth above the lesser trochanter.  After  this, we entered the canal with an initiator and a T-handled reamer and  sequentially reamed this to 12 mm in diameter with good cortical purchase in  the appropriate version direction.  We palpated the internal canal and it  was found to be intact.   Next, we sequentially broached in the appropriate version with a 12.5 stem  with excellent purchase and reamed the calcar.  This was then removed.  Attention was turned towards the acetabulum.  We placed retractors around  the acetabulum, utilizing Cobra retractors.  Severe osteoporosis was noted.  Utilized curettes to remove the remaining cartilage.  Multiple cysts were  noted and they were curetted as well.  There was osteophytic spurring  circumferentially and this was partially removed with a Leksell rongeur.  We  then identified the fovea in its depth.  We then measured the head, and it  was 49.  We therefore started with a reamer 47 in the appropriate version,  and sequentially reamed to a 51 mm diameter.  Excellent rim purchase was  noted, good bleeding tissue down to the bottom of the fovea.  We then  trialed an acetabular trial, the appropriate version.  Excellent purchase.  Placed a femoral trial and a +5 was found to be satisfactory.  We had again  reamed to 53 in the acetabulum.  This was then all removed.  Good bleeding  tissue was noted.  Electrocautery utilized achieved hemostasis.  We removed  the labrum as well.  Placed a Duraloc DePuy porous-coated cup in the  appropriate acetabular version and impacted into place with excellent  purchase.  The inserting rod was utilized and moved the cup and the pelvis  in one motion.  We were  flush with the bottom of the acetabulum and an  excellent rim fit.  We then placed a 10-degree 54 mm liner in the superior  posterior position.  Next, we trialed a trial femoral component, +5  excellent version, good full range of motion without dislocation.  Then we  dislocated and removed the femoral cup.  We copiously irrigated the femoral  canal and then inserted a 12 mm MMA.  We had reamed up to an 11.5 for the  femoral canal.  We impacted the femoral component to the appropriate  version.  A 12 mm was utilized with excellent purchase, sitting flush to the  calcar.  Next, we trialed a +5 femoral head.  It was found to be just  slightly loose.  There was some slight chucking noted.  We felt that this  would be best at a +9.  The +9 was then inserted with excellent purchase,  good range without dislocation, good extension, external rotation, no  dislocation anteriorly, no dislocation posteriorly.  We then removed this  trial component and deflected the +9 ball and impacted it on the [facility] taper  as it was cleaned and re-evaluated it.  There was full range of motion  without dislocation.  The leg lengths were checked; perhaps some slight, but  seemed equivalent on the table.  We had reamed to the bottom of the  acetabulum just a centimeter above the lesser trochanter and they appeared  to be satisfactory.  Next, the wound was copiously irrigated with antibiotic  irrigation.  Electrocautery utilized, check  hemostasis and there was no  active bleeding.   We repaired the capsule leaflets with #1 Ethibond interrupted figure-of-  eight sutures.  The sciatic nerve was palpated and found to be intact.  I  repaired the piriformis and the adductor tenotomy with #1 Vicryl interrupted  figure-of-eight sutures.  The fascia lata was repaired with #1 Vicryl  interrupted figure-of-eight sutures.  Subcutaneous tissue reapproximated  with #2-0 Vicryl simple sutures.  Skin was reapproximated with  staples.  The would was dressed sterilely.  She was placed supine on the OR table.  Leg  lengths were measured and they were grossly equivalent.  She was extubated  without difficulty and transported to the recovery room in satisfactory  condition after an abduction pillow placed.   The patient tolerated the procedure well.  There were no complications.  Blood loss was 800 cc.  She received 1 unit intraoperatively of packed  cells.                                               Jene Every, M.D.    Cordelia Pen  D:  10/22/2003  T:  10/22/2003  Job:  [id number]

## 2011-01-31 ENCOUNTER — Ambulatory Visit: Payer: Medicare Other | Admitting: Internal Medicine

## 2011-02-13 ENCOUNTER — Other Ambulatory Visit: Payer: Self-pay | Admitting: Internal Medicine

## 2011-02-13 NOTE — Telephone Encounter (Signed)
Spoke with patient. She got #30 grams on 01-04-11 and #30 more grams on 01/24/11. Asked how often she is using the cream. She states that she is using only a very small amount to the rectum and inside the rectum once daily. Explained that 1 tube should last at least 30 days. I will send 1 refill. Patient verbalizes understanding.

## 2011-03-10 ENCOUNTER — Other Ambulatory Visit: Payer: Self-pay | Admitting: Pulmonary Disease

## 2011-06-22 ENCOUNTER — Ambulatory Visit (INDEPENDENT_AMBULATORY_CARE_PROVIDER_SITE_OTHER): Payer: Medicare Other | Admitting: Pulmonary Disease

## 2011-06-22 ENCOUNTER — Encounter: Payer: Self-pay | Admitting: Pulmonary Disease

## 2011-06-22 DIAGNOSIS — M1990 Unspecified osteoarthritis, unspecified site: Secondary | ICD-10-CM

## 2011-06-22 DIAGNOSIS — I10 Essential (primary) hypertension: Secondary | ICD-10-CM

## 2011-06-22 DIAGNOSIS — K5730 Diverticulosis of large intestine without perforation or abscess without bleeding: Secondary | ICD-10-CM

## 2011-06-22 DIAGNOSIS — F17200 Nicotine dependence, unspecified, uncomplicated: Secondary | ICD-10-CM

## 2011-06-22 DIAGNOSIS — M949 Disorder of cartilage, unspecified: Secondary | ICD-10-CM

## 2011-06-22 DIAGNOSIS — E7800 Pure hypercholesterolemia, unspecified: Secondary | ICD-10-CM

## 2011-06-22 DIAGNOSIS — R1013 Epigastric pain: Secondary | ICD-10-CM

## 2011-06-22 DIAGNOSIS — Z23 Encounter for immunization: Secondary | ICD-10-CM

## 2011-06-22 DIAGNOSIS — K3189 Other diseases of stomach and duodenum: Secondary | ICD-10-CM

## 2011-06-22 NOTE — Patient Instructions (Signed)
Today we updated your med list in our EPIC system...    Continue your current medications the same...  PLEASE PLEase please quit smoking!!!    Call the quit line at 1-800-QUIT NOW...  We gave you the 2012 flu vaccine today...  Call for any problems...  Let's plan a follow up visit in 6 months w/ FASTING blood work & a CXR at that time.Marland KitchenMarland Kitchen

## 2011-06-23 ENCOUNTER — Encounter: Payer: Self-pay | Admitting: Pulmonary Disease

## 2011-06-23 NOTE — Progress Notes (Signed)
Subjective:    Patient ID: [name], female    DOB: Sep 16, 1933, 77 y.o.   MRN: [id number]  HPI  [name] here for a follow up visit... she has HBP, Chol, DJD, and mult med problems as noted below...   ~  December 19, 2009:  she's been getting the "muscle therapy" in [place], now on home exercises... still smoking & we discussed smoking cessation strategies (CXR 10/10 showed NAD).Marland Kitchen. BP controlled & doing satis w/ switch to Losartan... she is due for FASTING blood work & BMD & will ret to our lab for these tests... we discussed Rx for her RLS...  ~  June 21, 2010:  she has lost 10# down to 133# but notes her appetite is good, denies pain anywhere, no localizing signs or symptoms- CXR w/ COPD, NAD; and labs OK... still smokes ~1/2 ppd, denies much cough & no sputum or hemoptysis etc... no CP, palpit, or ch in DOE... BP controlled on meds;  Chol has been good on Lip20;  GI somewhat improved after her muscle therapy;  BMD checked 5/11 w/ osteopenia- on Calcium, MVI Vit D... OK Flu shot today, she will try Ensure.  ~  December 21, 2010:  9mo ROV & she reports 2 cataract surgeries by DrShapiro followed by ?retinal surg by DrRankin (we don't have records);  Her chief complaint is hemorrhoids which she says are no better after suppos Rx & AnusolHC cream Rx> she wonders about going to see [facility] having seen advert on TV for his hem rx, but we decided to f/u w/ DrDBrodie here;  Still smoking but down to <1/2ppd she says, classic chr bronchitic AM cough/ phlegm & we discussed Mucinex prn (she doesn't want smoking cessation help or breathing meds "my breathing is fine");  BP controlled on meds- denies CP, palpit, dizzy, edema, etc;  Due for fasting blood work & she will ret for this> all normal...  ~  June 22, 2011:  9mo ROV & she has had a good interval, no new complaints or concerns; she brings in a Living Will for [place] to scan into the record; requests flu shot today...    Cig smoker> still smokes ~1/2ppd  & does not want smoking cessation counseling or help; denies cough, phlegm, hemoptysis, CP, SOB, etc...    HBP> on Metoprolol50, Hyzaar100; BP= 148/82 today & even better at home- reminded to reduce sodium, take meds daily; she denies CP, palpit, SOB, edema, etc.    CHOL> on Lipitor20; FLP looks good & all parameters are at goal...    GI> on Prilosec OTC prn, Miralax, Metamucil, Analpram Cream; she's had considerable difficulty w/ hemorrhoids but now says "it's not my hem's at all, it's my glands" w/ evals from DrDBrodie, DrToth ([facility]), and DrFuller at [facility]... Follow up colonoscopy 5/12 by DrBrodie showed severe diverticulosis & rectal prolapse...    DJD> on Ca++ MVI, VitD, Advil; she had right THR in 2005...    Anxiety> she has been on Zoloft & Alpraz in the past...         Problem List:  FATIGUE (ICD-780.79) - she rests OK, wakes tired most days, no daytime hypersomnolence...  GLAUCOMA (ICD-365.9) - on eye drops per ophthalmology;  Had bilat cat surg by DrShapiro, then required retinal surg by DrRankin (we don't have records).  CIGARETTE SMOKER (ICD-305.1) - still smokes ~1/2ppd & must quit, she knows the risks & not interested in smoking cessation help... ~  CXR 10/07 showed sl hyperinflation, mild incr  Cor... ~  CXR in ER 10/09 showed COPD, bibasilar scarring, NAD.Marland Kitchen. ~  CXR 10/10 showed similar findings- scarring, NAD.Marland Kitchen. ~  CXR 10/11 showed COPD/ Emphysema, bibasilar scarring, DJD sp, NAD.  HYPERTENSION (ICD-401.9) - controlled on ASA 81mg /d, TOPROL XL 50mg /d & LOSARTAN/HCT 100-12.5 daily..  ~  10/11:  BP= 134/76 & she tells me that she sometimes takes these meds Qod and that she feels better on this schedule & wishes to continue this way... ~  4/12:  BP= 140/70 & she denies HA, CP, palpit, SOB, edema, etc... ~  10/12:  BP= 148/82 & she remains asymptomatic...  HYPERCHOLESTEROLEMIA (ICD-272.0) - on LIPITOR 20mg /d... ~  FLP 4/09 showed TChol 181, TG 73, HDL 54, LDL 112 ~  FLP 4/10 showed  TChol 168, TG 63, HDL 53, LDL 102 ~  FLP 4/11 showed TChol 169, TG 64, HDL 57, LDL 100 ~  FLP 4/12 showed TChol 163, TG 52, HDL 70, LDL 83... Continue Lip20.  DYSPEPSIA (ICD-536.8) - uses PRILOSEC OTC 20mg /d Prn...  DIVERTICULOSIS OF COLON (ICD-562.10) - she alternates MIRALAX & METAMUCIL Qod... COLONIC POLYPS (ICD-211.3)  Family Hx of COLON CANCER (ICD-153.9) HEMORRHOIDS (ICD-455.6) - "Hemorrhoids are my biggest problem" > she will f/u GI- DrDBrodie... OTHER SPECIFIED DISORDER OF RECTUM AND ANUS (ICD-569.49) -  - eval by DrToth & referred to [place] DrFuller for painful defecation... he recommended muscle retraining but initially her insurance wouldn't cover this, later they did cover & she had the "muscle therapy" now doing home therapy... ~  Colonoscopy by DrBrodie 6/07 showed divertics, hems... ~  Colonoscopy 5/12 showed severe diverticulosis & rectal prolapse...  RENAL CYST (ICD-593.2) - Abd Sonar 11/09 showed sm bilat renal cysts...  DEGENERATIVE JOINT DISEASE (ICD-715.90) - s/p right THR 3/05 by DrBeane... she also has a known lumbar spondylosis w/ right leg radiculopathy... she states that exercise is helping... ~  10/10: c/o knee pain- she declines Rheum/ Ortho referral... continue OTC meds (Advil) Prn...  OSTEOPENIA (ICD-733.90) & VITAMIN D DEFICIENCY (ICD-268.9) -  ~  labs 4/10 showed Vit D level = 23 & started on Vit D 1000 u OTC daily... ~  BMD 5/11 showed TScores -1.2 in Spine, and -1.6 in [facility]... on Calcium, MVI, Vit D. ~  labs 5/11 showed Vit D level = 41... continue 1000 u daily.  Hx of RESTLESS LEGS SYNDROME (ICD-333.94) - she tried Requip 1mg  Qhs in the past> now off...  ANXIETY (ICD-300.00) - she states that she stopped the Alprazolam (prev 0.5mg  Prn)... ~  10/09 states depressed- 2 sisters died 30-Jan-2023- one w/ ca pancreas, other w/ DM/ ASPVD.Marland Kitchen. tried Zofoft transiently then stopped.  HEALTH MAINTENANCE:  she sees DrMezer et al for GYN, neg mammogram 3/12... she gets the  yearly seasonal Flu vaccines, and had the PNEUMOVAX in 2005 at age 89...   Past Surgical History  Procedure Date  . Total hip arthroplasty 2/05    right by Dr. Shelle Iron  . Cataract extraction   . Upper gastrointestinal endoscopy   . Colonoscopy     Outpatient Encounter Prescriptions as of 06/22/2011  Medication Sig Dispense Refill  . aspirin 81 MG tablet Take 81 mg by mouth daily.        Marland Kitchen atorvastatin (LIPITOR) 20 MG tablet TAKE 1 TABLET BY MOUTH ONCE DAILY  30 tablet  5  . calcium-vitamin D (OSCAL WITH D) 500-200 MG-UNIT per tablet Take 1 tablet by mouth daily.        . Cholecalciferol (VITAMIN D3) 1000 UNITS CAPS  Take 1 capsule by mouth daily.        . hydrocortisone 2.5 % cream APPLY TOPICALLY 2 TIMES A DAY  30 g  0  . ibuprofen (ADVIL) 200 MG tablet as needed.        . latanoprost (XALATAN) 0.005 % ophthalmic solution Place 1 drop into both eyes 2 (two) times daily.       Marland Kitchen levobunolol (BETAGAN) 0.5 % ophthalmic solution Place 1 drop into both eyes at bedtime.        Marland Kitchen losartan-hydrochlorothiazide (HYZAAR) 100-12.5 MG per tablet take 1 tablet by mouth once daily  30 tablet  4  . metoprolol (TOPROL-XL) 50 MG 24 hr tablet Take 50 mg by mouth daily.        . Multiple Vitamin (MULTIVITAMIN) tablet Take 1 tablet by mouth daily.        . polyethylene glycol powder (MIRALAX) powder Take 17 g by mouth daily.        . pramoxine-hydrocortisone (ANALPRAM HC) cream Apply to affected area 3 times daily  30 g  1  . travoprost, benzalkonium, (TRAVATAN) 0.004 % ophthalmic solution as directed.        . hydrocortisone (ANUSOL-HC) 25 MG suppository Place 1 suppository (25 mg total) rectally daily.  12 suppository  1    No Known Allergies   Current Medications, Allergies, Past Medical History, Past Surgical History, Family History, and Social History were reviewed in [employer] record.    Review of Systems       See HPI - all other systems neg except as noted... The  patient complains of dyspnea on exertion.  The patient denies anorexia, fever, weight loss, weight gain, vision loss, decreased hearing, hoarseness, chest pain, syncope, peripheral edema, prolonged cough, headaches, hemoptysis, abdominal pain, melena, hematochezia, severe indigestion/heartburn, hematuria, incontinence, muscle weakness, suspicious skin lesions, transient blindness, difficulty walking, depression, unusual weight change, abnormal bleeding, enlarged lymph nodes, and angioedema.     Objective:   Physical Exam     WD, WN, [name] in NAD... GENERAL:  Alert & oriented; pleasant & cooperative. HEENT:  [place]/AT, EOM-wnl, PERRLA, EACs-clear, TMs-wnl, NOSE-clear, THROAT-clear & wnl. NECK:  Supple w/ fairROM; no JVD; normal carotid impulses w/o bruits; no thyromegaly or nodules palpated; no lymphadenopathy. CHEST:  Clear to P & A; without wheezes/ rales/ or rhonchi. HEART:  Regular Rhythm;  gr 1/6 SEM without rubs or gallops heard... ABDOMEN:  Soft & nontender; normal bowel sounds; no organomegaly or masses detected. EXT: without deformities, mod arthritic changes; no varicose veins/ +venous insuffic/ tr edema. NEURO:  CN's intact;  no focal neuro deficits... DERM:  No lesions noted; no rash etc...   Assessment & Plan:           SMOKER>  We again reviewed the importance of smoking cessation, she declines smoking cessation help...  HBP>  Controlled on BBlocker, ARB, Diuretic;  Continue same + no salt etc...  CHOL>  She remains stable on the Lip20;  Continue same + diet/ exercise...  DYSPEPSIA>  Continue Prilosec OTC Prn...  HEM's>  She has followed up w/ DrDBrodie & had colonoscopy 5/12...  DJD/ Osteopenia>  As noted right THR 2005, known LBP/ spondylosis; c/o knee pain on & off & uses OTC analgesics prn...  Anxiety> she declines anxiolytic Rx.Marland KitchenMarland Kitchen

## 2011-07-17 ENCOUNTER — Other Ambulatory Visit: Payer: Self-pay | Admitting: Pulmonary Disease

## 2011-08-28 HISTORY — PX: OTHER SURGICAL HISTORY: SHX169

## 2011-09-19 ENCOUNTER — Other Ambulatory Visit: Payer: Self-pay | Admitting: Pulmonary Disease

## 2011-10-20 ENCOUNTER — Other Ambulatory Visit: Payer: Self-pay | Admitting: Pulmonary Disease

## 2011-11-19 DIAGNOSIS — H409 Unspecified glaucoma: Secondary | ICD-10-CM | POA: Diagnosis not present

## 2011-11-19 DIAGNOSIS — H4011X0 Primary open-angle glaucoma, stage unspecified: Secondary | ICD-10-CM | POA: Diagnosis not present

## 2011-11-27 DIAGNOSIS — T8529XA Other mechanical complication of intraocular lens, initial encounter: Secondary | ICD-10-CM | POA: Diagnosis not present

## 2011-12-05 DIAGNOSIS — Y929 Unspecified place or not applicable: Secondary | ICD-10-CM | POA: Diagnosis not present

## 2011-12-05 DIAGNOSIS — H2700 Aphakia, unspecified eye: Secondary | ICD-10-CM | POA: Diagnosis not present

## 2011-12-05 DIAGNOSIS — T8529XA Other mechanical complication of intraocular lens, initial encounter: Secondary | ICD-10-CM | POA: Diagnosis not present

## 2011-12-05 DIAGNOSIS — Y838 Other surgical procedures as the cause of abnormal reaction of the patient, or of later complication, without mention of misadventure at the time of the procedure: Secondary | ICD-10-CM | POA: Diagnosis not present

## 2011-12-05 DIAGNOSIS — Y998 Other external cause status: Secondary | ICD-10-CM | POA: Diagnosis not present

## 2011-12-05 DIAGNOSIS — Y9389 Activity, other specified: Secondary | ICD-10-CM | POA: Diagnosis not present

## 2011-12-07 ENCOUNTER — Encounter: Payer: Medicare Other | Admitting: Pulmonary Disease

## 2011-12-13 DIAGNOSIS — T8529XA Other mechanical complication of intraocular lens, initial encounter: Secondary | ICD-10-CM | POA: Diagnosis not present

## 2011-12-13 DIAGNOSIS — H43399 Other vitreous opacities, unspecified eye: Secondary | ICD-10-CM | POA: Diagnosis not present

## 2011-12-17 ENCOUNTER — Ambulatory Visit (INDEPENDENT_AMBULATORY_CARE_PROVIDER_SITE_OTHER): Payer: Medicare Other | Admitting: Pulmonary Disease

## 2011-12-17 ENCOUNTER — Ambulatory Visit (INDEPENDENT_AMBULATORY_CARE_PROVIDER_SITE_OTHER)
Admission: RE | Admit: 2011-12-17 | Discharge: 2011-12-17 | Disposition: A | Discharge status: Home / Self Care | Payer: Medicare Other | Source: Ambulatory Visit | Attending: Pulmonary Disease | Admitting: Pulmonary Disease

## 2011-12-17 ENCOUNTER — Encounter: Payer: Self-pay | Admitting: Pulmonary Disease

## 2011-12-17 VITALS — BP 140/82 | HR 70 | Temp 97.0°F | Ht 66.0 in | Wt 133.2 lb

## 2011-12-17 DIAGNOSIS — J984 Other disorders of lung: Secondary | ICD-10-CM | POA: Diagnosis not present

## 2011-12-17 DIAGNOSIS — F17200 Nicotine dependence, unspecified, uncomplicated: Secondary | ICD-10-CM

## 2011-12-17 DIAGNOSIS — D126 Benign neoplasm of colon, unspecified: Secondary | ICD-10-CM

## 2011-12-17 DIAGNOSIS — K3189 Other diseases of stomach and duodenum: Secondary | ICD-10-CM

## 2011-12-17 DIAGNOSIS — I10 Essential (primary) hypertension: Secondary | ICD-10-CM | POA: Diagnosis not present

## 2011-12-17 DIAGNOSIS — Z0000 Encounter for general adult medical examination without abnormal findings: Secondary | ICD-10-CM | POA: Diagnosis not present

## 2011-12-17 DIAGNOSIS — E7800 Pure hypercholesterolemia, unspecified: Secondary | ICD-10-CM

## 2011-12-17 DIAGNOSIS — M949 Disorder of cartilage, unspecified: Secondary | ICD-10-CM

## 2011-12-17 DIAGNOSIS — K5730 Diverticulosis of large intestine without perforation or abscess without bleeding: Secondary | ICD-10-CM

## 2011-12-17 DIAGNOSIS — F411 Generalized anxiety disorder: Secondary | ICD-10-CM

## 2011-12-17 DIAGNOSIS — M1990 Unspecified osteoarthritis, unspecified site: Secondary | ICD-10-CM

## 2011-12-17 DIAGNOSIS — M899 Disorder of bone, unspecified: Secondary | ICD-10-CM

## 2011-12-17 DIAGNOSIS — R1013 Epigastric pain: Secondary | ICD-10-CM | POA: Diagnosis not present

## 2011-12-17 NOTE — Patient Instructions (Addendum)
Today we updated your med list in our EPIC system...    Continue your current medications the same...  Today we did your follow up CXR...    PLEASE quit smoking Vallorie!!!  Please return to our lab one morning this week for your FASTING blood work...    We will call you w/ all this data when avail...  Stay as active as possible...  Call for any questions...  Let's continue our 31mo check ups.Marland KitchenMarland Kitchen

## 2011-12-17 NOTE — Progress Notes (Addendum)
Subjective:    Patient ID: [name], female    DOB: [birth year], 78 y.o.   MRN: [id number]  HPI 78 y/o BF here for a follow up visit... she has HBP, Chol, DJD, and mult med problems as noted below...   ~  December 21, 2010:  38mo ROV & she reports 2 cataract surgeries by DrShapiro followed by ?retinal surg by DrRankin (we don't have records);  Her chief complaint is hemorrhoids which she says are no better after suppos Rx & AnusolHC cream Rx> she wonders about going to see [facility] having seen advert on TV for his hem rx, but we decided to f/u w/ DrDBrodie here;  Still smoking but down to <1/2ppd she says, classic chr bronchitic AM cough/ phlegm & we discussed Mucinex prn (she doesn't want smoking cessation help or breathing meds "my breathing is fine");  BP controlled on meds- denies CP, palpit, dizzy, edema, etc;  Due for fasting blood work & she will ret for this> all normal...  ~  June 22, 2011:  38mo ROV & she has had a good interval, no new complaints or concerns; she brings in a Living Will for [place] to scan into the record; ok flu shot...    Cig smoker> still smokes ~1/2ppd & does not want smoking cessation counseling or help; denies cough, phlegm, hemoptysis, CP, SOB, etc...    HBP> on Metoprolol50, Hyzaar100; BP= 148/82 today & even better at home- reminded to reduce sodium, take meds daily; she denies CP, palpit, SOB, edema, etc.    CHOL> on Lipitor20; FLP looks good & all parameters are at goal...    GI> on Prilosec OTC prn, Miralax, Metamucil, Analpram Cream; she's had considerable difficulty w/ hemorrhoids but now says "it's not my hem's at all, it's my glands" w/ evals from DrDBrodie, DrToth ([facility]), and DrFuller at [facility]... Follow up colonoscopy 5/12 by DrBrodie showed severe diverticulosis & rectal prolapse...    DJD> on Ca++ MVI, VitD, Advil; she had right THR in 2005...    Anxiety> she has been on Zoloft & Rebeca Allegra in the past...  ~  December 17, 2011:  38mo ROV & [name] is concerned about  her vision> continued f/u DrShapiro & DrRankin w/ more surg that she doesn't understand & we don't have any notes to review;  Still smoking 1/2ppd but denies much cough, sputum, no hemoptysis, SOB, etc> she is due for f/u CXR= stable scarring at the bases, DJD in spine, NAD;  BP controlled on her & Chol ok on her Lip20> see prob list below>> CXR 4/13 showed normal heart size, clear lungs w/ mild scarring at the bases, DJD in spine, NAD... LABS 4/13:  FLP- at goals on Lip20;  Chems- wnl;  CBC- wnl;  TSH=0.92;  VitD=31         Problem List:     << PROBLEM LIST UPDATED 12/17/11 >>  FATIGUE (ICD-780.79) - she rests OK, wakes tired most days, no daytime hypersomnolence...  GLAUCOMA (ICD-365.9) - on eye drops per ophthalmology;  Had bilat cat surg by DrShapiro, then required retinal surg by DrRankin (we don't have records) & she describes on-going prob w/ her vision that has her concerned...  CIGARETTE SMOKER (ICD-305.1) - still smokes ~1/2ppd & must quit, she knows the risks & not interested in smoking cessation help... ~  CXR 10/07 showed sl hyperinflation, mild incr Cor... ~  CXR in ER 10/09 showed COPD, bibasilar scarring, NAD.Marland Kitchen. ~  CXR 10/10 showed similar findings- scarring, NAD.Marland KitchenMarland Kitchen ~  CXR 10/11 showed COPD/ Emphysema, bibasilar scarring, DJD sp, NAD. ~  CXR 4/13 showed normal heart size, clear lungs w/ mild scarring at the bases, DJD in spine, NAD...  HYPERTENSION (ICD-401.9) - controlled on ASA 81mg /d, METOPROLOL 50mg /d & LOSARTAN/HCT 100-12.5 daily..  ~  10/11:  BP= 134/76 & she tells me that she sometimes takes these meds Qod and that she feels better on this schedule & wishes to continue this way... ~  4/12:  BP= 140/70 & she denies HA, CP, palpit, SOB, edema, etc... ~  10/12:  BP= 148/82 & she remains asymptomatic... ~  4/13:  BP= 140/82 & she denies HAs, CP, palpit, dizzy, SOB, edema...  HYPERCHOLESTEROLEMIA (ICD-272.0) - on LIPITOR 20mg /d... ~  FLP 4/09 showed TChol 181, TG 73,  HDL 54, LDL 112 ~  FLP 4/10 showed TChol 168, TG 63, HDL 53, LDL 102 ~  FLP 4/11 on Lip20 showed TChol 169, TG 64, HDL 57, LDL 100 ~  FLP 4/12 on Lip20 showed TChol 163, TG 52, HDL 70, LDL 83... Continue same. ~  FLP 4/13 on Lip20 showed TChol 170, TG 46, HDL 79, LDL 82  DYSPEPSIA (ICD-536.8) - uses PRILOSEC OTC as needed...  DIVERTICULOSIS OF COLON (ICD-562.10) - she alternates MIRALAX & METAMUCIL Qod... COLONIC POLYPS (ICD-211.3)  Family Hx of COLON CANCER (ICD-153.9) HEMORRHOIDS (ICD-455.6) - "Hemorrhoids are my biggest problem" > she will f/u GI- DrDBrodie... OTHER SPECIFIED DISORDER OF RECTUM AND ANUS (ICD-569.49) -  - eval by DrToth & referred to [facility] DrFuller for painful defecation... he recommended muscle retraining but initially her insurance wouldn't cover this, later they did cover & she had the "muscle therapy" now doing home therapy... ~  Colonoscopy by DrBrodie 6/07 showed divertics, hems... ~  Colonoscopy 5/12 showed severe diverticulosis & rectal prolapse...  RENAL CYST (ICD-593.2) - Abd Sonar 11/09 showed sm bilat renal cysts...  DEGENERATIVE JOINT DISEASE (ICD-715.90) - s/p right THR 3/05 by DrBeane... she also has a known lumbar spondylosis w/ right leg radiculopathy... she states that exercise is helping... ~  10/10: c/o knee pain- she declines Rheum/ Ortho referral... continue OTC meds (Advil) Prn...  OSTEOPENIA (ICD-733.90) & VITAMIN D DEFICIENCY (ICD-268.9) -  ~  labs 4/10 showed Vit D level = 23 & started on Vit D 1000 u OTC daily... ~  BMD 5/11 showed TScores -1.2 in Spine, and -1.6 in [facility]... on Calcium, MVI, Vit D. ~  labs 5/11 showed Vit D level = 41... continue 1000 u daily. ~  Labs 4/13 showed Vit D level = 31  Hx of RESTLESS LEGS SYNDROME (ICD-333.94) - she tried Requip 1mg  Qhs in the past> now off...  ANXIETY (ICD-300.00) - she states that she stopped the Alprazolam (prev 0.5mg  Prn)... ~  10/09 states depressed- 2 sisters died Jan 26, 2023- one w/ ca pancreas,  other w/ DM/ ASPVD.Marland Kitchen. tried Zofoft transiently then stopped.  HEALTH MAINTENANCE:  she sees DrMezer et al for GYN, neg mammogram 3/12... she gets the yearly seasonal Flu vaccines, and had the PNEUMOVAX in 2005 at age 38...   Past Surgical History  Procedure Date  . Total hip arthroplasty 2/05    right by Dr. Shelle Iron  . Cataract extraction   . Upper gastrointestinal endoscopy   . Colonoscopy   . Retina surgery x 2     Outpatient Encounter Prescriptions as of 12/17/2011  Medication Sig Dispense Refill  . aspirin 81 MG tablet Take 81 mg by mouth daily.        Marland Kitchen atorvastatin (  LIPITOR) 20 MG tablet TAKE 1 TABLET BY MOUTH ONCE DAILY  30 tablet  5  . calcium-vitamin D (OSCAL WITH D) 500-200 MG-UNIT per tablet Take 1 tablet by mouth daily.        . Cholecalciferol (VITAMIN D3) 1000 UNITS CAPS Take 1 capsule by mouth daily.        . hydrocortisone 2.5 % cream APPLY TOPICALLY 2 TIMES A DAY  30 g  0  . ibuprofen (ADVIL) 200 MG tablet as needed.        . latanoprost (XALATAN) 0.005 % ophthalmic solution Place 1 drop into both eyes 2 (two) times daily.       Marland Kitchen levobunolol (BETAGAN) 0.5 % ophthalmic solution Place 1 drop into both eyes at bedtime.        Marland Kitchen losartan-hydrochlorothiazide (HYZAAR) 100-12.5 MG per tablet take 1 tablet by mouth once daily  30 tablet  4  . metoprolol (LOPRESSOR) 50 MG tablet take 1 tablet by mouth once daily  30 tablet  5  . Multiple Vitamin (MULTIVITAMIN) tablet Take 1 tablet by mouth daily.        . polyethylene glycol powder (MIRALAX) powder Take 17 g by mouth daily.        . pramoxine-hydrocortisone (ANALPRAM HC) cream Apply to affected area 3 times daily  30 g  1  . travoprost, benzalkonium, (TRAVATAN) 0.004 % ophthalmic solution as directed.        . hydrocortisone (ANUSOL-HC) 25 MG suppository Place 1 suppository (25 mg total) rectally daily.  12 suppository  1  . DISCONTD: metoprolol (TOPROL-XL) 50 MG 24 hr tablet Take 50 mg by mouth daily.          Facility-Administered Encounter Medications as of 12/17/2011  Medication Dose Route Frequency Provider Last Rate Last Dose  . 0.9 %  sodium chloride infusion  500 mL Intravenous Continuous Hart Carwin, MD        No Known Allergies   Current Medications, Allergies, Past Medical History, Past Surgical History, Family History, and Social History were reviewed in [employer] record.    Review of Systems       See HPI - all other systems neg except as noted... The patient complains of dyspnea on exertion.  The patient denies anorexia, fever, weight loss, weight gain, vision loss, decreased hearing, hoarseness, chest pain, syncope, peripheral edema, prolonged cough, headaches, hemoptysis, abdominal pain, melena, hematochezia, severe indigestion/heartburn, hematuria, incontinence, muscle weakness, suspicious skin lesions, transient blindness, difficulty walking, depression, unusual weight change, abnormal bleeding, enlarged lymph nodes, and angioedema.     Objective:   Physical Exam     WD, WN, 78 y/o BF in NAD... GENERAL:  Alert & oriented; pleasant & cooperative. HEENT:  [place]/AT, EOM-wnl, PERRLA, EACs-clear, TMs-wnl, NOSE-clear, THROAT-clear & wnl. NECK:  Supple w/ fairROM; no JVD; normal carotid impulses w/o bruits; no thyromegaly or nodules palpated; no lymphadenopathy. CHEST:  Clear to P & A; without wheezes/ rales/ or rhonchi. HEART:  Regular Rhythm;  gr 1/6 SEM without rubs or gallops heard... ABDOMEN:  Soft & nontender; normal bowel sounds; no organomegaly or masses detected. EXT: without deformities, mod arthritic changes; no varicose veins/ +venous insuffic/ tr edema. NEURO:  CN's intact;  no focal neuro deficits... DERM:  No lesions noted; no rash etc...  RADIOLOGY DATA:  Reviewed in the EPIC EMR & discussed w/ the patient...  LABORATORY DATA:  Reviewed in the EPIC EMR & discussed w/ the patient...   Assessment & Plan:  Visual  problems>  Followed by DrShapiro & DrRankin; encouraged to ask questions so she can better understand what is going on...  SMOKER>  We again reviewed the importance of smoking cessation, she declines smoking cessation help...  HBP>  Controlled on BBlocker, ARB, Diuretic;  Continue same + no salt etc...  CHOL>  She remains stable on the Lip20;  Continue same + diet/ exercise...  DYSPEPSIA>  Continue Prilosec OTC Prn...  HEM's>  She has followed up w/ DrDBrodie & had colonoscopy 5/12...  DJD/ Osteopenia>  As noted right THR 2005, known LBP/ spondylosis; c/o knee pain on & off & uses OTC analgesics prn...  Anxiety> she declines anxiolytic Rx.Marland KitchenMarland Kitchen

## 2011-12-19 ENCOUNTER — Other Ambulatory Visit (INDEPENDENT_AMBULATORY_CARE_PROVIDER_SITE_OTHER): Payer: Medicare Other

## 2011-12-19 DIAGNOSIS — M899 Disorder of bone, unspecified: Secondary | ICD-10-CM | POA: Diagnosis not present

## 2011-12-19 DIAGNOSIS — I10 Essential (primary) hypertension: Secondary | ICD-10-CM

## 2011-12-19 DIAGNOSIS — F411 Generalized anxiety disorder: Secondary | ICD-10-CM

## 2011-12-19 DIAGNOSIS — E7800 Pure hypercholesterolemia, unspecified: Secondary | ICD-10-CM

## 2011-12-19 DIAGNOSIS — D126 Benign neoplasm of colon, unspecified: Secondary | ICD-10-CM | POA: Diagnosis not present

## 2011-12-19 DIAGNOSIS — M949 Disorder of cartilage, unspecified: Secondary | ICD-10-CM

## 2011-12-19 LAB — TSH: TSH: 0.92 u[IU]/mL (ref 0.35–5.50)

## 2011-12-19 LAB — BASIC METABOLIC PANEL
BUN: 20 mg/dL (ref 6–23)
Calcium: 9.7 mg/dL (ref 8.4–10.5)
GFR: 90.98 mL/min (ref >60.00)
Glucose, Bld: 92 mg/dL (ref 70–99)
Potassium: 4.5 mEq/L (ref 3.5–5.1)
Sodium: 143 mEq/L (ref 135–145)

## 2011-12-19 LAB — CBC WITH DIFFERENTIAL/PLATELET
Basophils Absolute: 0 10*3/uL (ref 0.0–0.1)
HCT: 41.5 % (ref 36.0–46.0)
Hemoglobin: 13.8 g/dL (ref 12.0–15.0)
Lymphs Abs: 1.4 10*3/uL (ref 0.7–4.0)
MCV: 92.4 fl (ref 78.0–100.0)
Monocytes Absolute: 0.5 10*3/uL (ref 0.1–1.0)
Neutro Abs: 2.6 10*3/uL (ref 1.4–7.7)
Platelets: 167 10*3/uL (ref 150.0–400.0)
RDW: 13.2 % (ref 11.5–14.6)

## 2011-12-19 LAB — HEPATIC FUNCTION PANEL
AST: 19 U/L (ref 0–37)
Albumin: 3.9 g/dL (ref 3.5–5.2)
Total Bilirubin: 0.6 mg/dL (ref 0.3–1.2)

## 2011-12-19 LAB — LIPID PANEL
Cholesterol: 170 mg/dL (ref 0–200)
HDL: 79.2 mg/dL (ref >39.00)
LDL Cholesterol: 82 mg/dL (ref 0–99)
VLDL: 9.2 mg/dL (ref 0.0–40.0)

## 2011-12-24 DIAGNOSIS — H2010 Chronic iridocyclitis, unspecified eye: Secondary | ICD-10-CM | POA: Diagnosis not present

## 2011-12-24 DIAGNOSIS — H4011X0 Primary open-angle glaucoma, stage unspecified: Secondary | ICD-10-CM | POA: Diagnosis not present

## 2012-01-31 DIAGNOSIS — M949 Disorder of cartilage, unspecified: Secondary | ICD-10-CM | POA: Diagnosis not present

## 2012-01-31 DIAGNOSIS — Z1212 Encounter for screening for malignant neoplasm of rectum: Secondary | ICD-10-CM | POA: Diagnosis not present

## 2012-01-31 DIAGNOSIS — Z1231 Encounter for screening mammogram for malignant neoplasm of breast: Secondary | ICD-10-CM | POA: Diagnosis not present

## 2012-01-31 DIAGNOSIS — Z01419 Encounter for gynecological examination (general) (routine) without abnormal findings: Secondary | ICD-10-CM | POA: Diagnosis not present

## 2012-02-01 ENCOUNTER — Other Ambulatory Visit (INDEPENDENT_AMBULATORY_CARE_PROVIDER_SITE_OTHER): Payer: Medicare Other

## 2012-02-01 ENCOUNTER — Ambulatory Visit (INDEPENDENT_AMBULATORY_CARE_PROVIDER_SITE_OTHER): Payer: Medicare Other | Admitting: Adult Health

## 2012-02-01 ENCOUNTER — Encounter: Payer: Self-pay | Admitting: Adult Health

## 2012-02-01 VITALS — BP 140/90 | HR 99 | Temp 97.4°F | Ht 66.5 in | Wt 131.4 lb

## 2012-02-01 DIAGNOSIS — I10 Essential (primary) hypertension: Secondary | ICD-10-CM

## 2012-02-01 DIAGNOSIS — F411 Generalized anxiety disorder: Secondary | ICD-10-CM

## 2012-02-01 DIAGNOSIS — I499 Cardiac arrhythmia, unspecified: Secondary | ICD-10-CM | POA: Diagnosis not present

## 2012-02-01 DIAGNOSIS — I4891 Unspecified atrial fibrillation: Secondary | ICD-10-CM

## 2012-02-01 LAB — CBC WITH DIFFERENTIAL/PLATELET
Basophils Absolute: 0 10*3/uL (ref 0.0–0.1)
Eosinophils Relative: 4 % (ref 0.0–5.0)
HCT: 42.4 % (ref 36.0–46.0)
Lymphs Abs: 1.4 10*3/uL (ref 0.7–4.0)
MCV: 93.5 fl (ref 78.0–100.0)
Monocytes Absolute: 0.4 10*3/uL (ref 0.1–1.0)
Platelets: 182 10*3/uL (ref 150.0–400.0)
RDW: 13.7 % (ref 11.5–14.6)

## 2012-02-01 LAB — BASIC METABOLIC PANEL
BUN: 15 mg/dL (ref 6–23)
CO2: 29 mEq/L (ref 19–32)
Chloride: 107 mEq/L (ref 96–112)
Glucose, Bld: 70 mg/dL (ref 70–99)
Potassium: 4.3 mEq/L (ref 3.5–5.1)

## 2012-02-01 NOTE — Assessment & Plan Note (Addendum)
New onset Atrial Fib ? Unknown how long she has been in as she is asymptomatic  Rate is controlled with adequate b/p .  Discussed case with Dr. Kriste Basque  And Dr. Myrtis Ser with cardiology  Pt is stable with rate controlled and is on asa therapy. /BB  Cardiology has agreed to see pt and work her in on schedule first of next week ~3 days  She is aware if she becomes symptomatic with chest pain , dyspnea , headache/visual /speech changes she is to  [facility] /ER attention immediately  Avoid caffeine and decongestants.  Labs pending with tsh   Plan:  We are referring you to cardiology for new onset Atrial Fib.  Increase Metoprolol 50mg  1 in am and 1/2 At bedtime   Continue on Aspirin daily  If you develop chest pain , dizziness, or shortness of breath, contact our office or seek emergency care.  Please contact office for sooner follow up if symptoms do not improve or worsen or seek emergency care

## 2012-02-01 NOTE — Patient Instructions (Addendum)
We are referring you to cardiology for new onset Atrial Fib.  Increase Metoprolol 50mg  1 in am and 1/2 At bedtime   Continue on Aspirin daily  If you develop chest pain , dizziness, or shortness of breath, contact our office or seek emergency care.  Please contact office for sooner follow up if symptoms do not improve or worsen or seek emergency care

## 2012-02-01 NOTE — Progress Notes (Signed)
Subjective:    Patient ID: [name], female    DOB: 17-Dec-1933, 78 y.o.   MRN: [id number]  HPI 78 y/o BF with known hx of HBP, Chol, DJD, and mult med problems    ~  December 21, 2010:  62mo ROV & she reports 2 cataract surgeries by DrShapiro followed by ?retinal surg by DrRankin (we don't have records);  Her chief complaint is hemorrhoids which she says are no better after suppos Rx & AnusolHC cream Rx> she wonders about going to see [facility] having seen advert on TV for his hem rx, but we decided to f/u w/ DrDBrodie here;  Still smoking but down to <1/2ppd she says, classic chr bronchitic AM cough/ phlegm & we discussed Mucinex prn (she doesn't want smoking cessation help or breathing meds "my breathing is fine");  BP controlled on meds- denies CP, palpit, dizzy, edema, etc;  Due for fasting blood work & she will ret for this> all normal...  ~  June 22, 2011:  62mo ROV & she has had a good interval, no new complaints or concerns; she brings in a Living Will for [place] to scan into the record; ok flu shot...    Cig smoker> still smokes ~1/2ppd & does not want smoking cessation counseling or help; denies cough, phlegm, hemoptysis, CP, SOB, etc...    HBP> on Metoprolol50, Hyzaar100; BP= 148/82 today & even better at home- reminded to reduce sodium, take meds daily; she denies CP, palpit, SOB, edema, etc.    CHOL> on Lipitor20; FLP looks good & all parameters are at goal...    GI> on Prilosec OTC prn, Miralax, Metamucil, Analpram Cream; she's had considerable difficulty w/ hemorrhoids but now says "it's not my hem's at all, it's my glands" w/ evals from DrDBrodie, DrToth ([facility]), and DrFuller at [facility]... Follow up colonoscopy 5/12 by DrBrodie showed severe diverticulosis & rectal prolapse...    DJD> on Ca++ MVI, VitD, Advil; she had right THR in 2005...    Anxiety> she has been on Zoloft & Rebeca Allegra in the past...  ~  December 17, 2011:  62mo ROV & [name] is concerned about her vision> continued f/u Drshapiro &  DrRankin w/ more surg that she doesn't understand & we don't have any notes to review;  Still smoking 1/2ppd but denies much cough, sputum, no hemoptysis, SOB, etc> she is due for f/u CXR=  ;  BP controlled on her & Chol ok on her Lip20> see prob list below>> CXR 4/13 showed  LABS 4/13:  FLP-   02/01/2012 Acute OV  Referred from GYN with notable irregular heartbeat on exam. EKG today shows new onset Atrial Fib , rate controlled at 90  Pt feels fine except for lightheadness at times.  No chest pain . No dyspnea.  No decongestants .  No new meds  She is maintained on metoprolol 50mg  daily for HTN .          Problem List:     << PROBLEM LIST UPDATED 12/17/11 >>  FATIGUE (ICD-780.79) - she rests OK, wakes tired most days, no daytime hypersomnolence...  GLAUCOMA (ICD-365.9) - on eye drops per ophthalmology;  Had bilat cat surg by DrShapiro, then required retinal surg by DrRankin (we don't have records) & she describes on-going prob w/ her vision that has her concerned...  CIGARETTE SMOKER (ICD-305.1) - still smokes ~1/2ppd & must quit, she knows the risks & not interested in smoking cessation help... ~  CXR 10/07 showed sl hyperinflation, mild  incr Cor... ~  CXR in ER 10/09 showed COPD, bibasilar scarring, NAD.Marland Kitchen. ~  CXR 10/10 showed similar findings- scarring, NAD.Marland Kitchen. ~  CXR 10/11 showed COPD/ Emphysema, bibasilar scarring, DJD sp, NAD. ~  CXR 4/13 showed   HYPERTENSION (ICD-401.9) - controlled on ASA 81mg /d, METOPROLOL 50mg /d & LOSARTAN/HCT 100-12.5 daily..  ~  10/11:  BP= 134/76 & she tells me that she sometimes takes these meds Qod and that she feels better on this schedule & wishes to continue this way... ~  4/12:  BP= 140/70 & she denies HA, CP, palpit, SOB, edema, etc... ~  10/12:  BP= 148/82 & she remains asymptomatic... ~  4/13:  BP= 140/82 & she denies HAs, CP, palpit, dizzy, SOB, edema...  HYPERCHOLESTEROLEMIA (ICD-272.0) - on LIPITOR 20mg /d... ~  FLP 4/09 showed TChol 181,  TG 73, HDL 54, LDL 112 ~  FLP 4/10 showed TChol 168, TG 63, HDL 53, LDL 102 ~  FLP 4/11 on Lip20 showed TChol 169, TG 64, HDL 57, LDL 100 ~  FLP 4/12 on Lip20 showed TChol 163, TG 52, HDL 70, LDL 83... Continue same. ~  FLP 4/13 on Lip20 showed   DYSPEPSIA (ICD-536.8) - uses PRILOSEC OTC as needed...  DIVERTICULOSIS OF COLON (ICD-562.10) - she alternates MIRALAX & METAMUCIL Qod... COLONIC POLYPS (ICD-211.3)  Family Hx of COLON CANCER (ICD-153.9) HEMORRHOIDS (ICD-455.6) - "Hemorrhoids are my biggest problem" > she will f/u GI- DrDBrodie... OTHER SPECIFIED DISORDER OF RECTUM AND ANUS (ICD-569.49) -  - eval by DrToth & referred to [facility] DrFuller for painful defecation... he recommended muscle retraining but initially her insurance wouldn't cover this, later they did cover & she had the "muscle therapy" now doing home therapy... ~  Colonoscopy by DrBrodie 6/07 showed divertics, hems... ~  Colonoscopy 5/12 showed severe diverticulosis & rectal prolapse...  RENAL CYST (ICD-593.2) - Abd Sonar 11/09 showed sm bilat renal cysts...  DEGENERATIVE JOINT DISEASE (ICD-715.90) - s/p right THR 3/05 by DrBeane... she also has a known lumbar spondylosis w/ right leg radiculopathy... she states that exercise is helping... ~  10/10: c/o knee pain- she declines Rheum/ Ortho referral... continue OTC meds (Advil) Prn...  OSTEOPENIA (ICD-733.90) & VITAMIN D DEFICIENCY (ICD-268.9) -  ~  labs 4/10 showed Vit D level = 23 & started on Vit D 1000 u OTC daily... ~  BMD 5/11 showed TScores -1.2 in Spine, and -1.6 in [facility]... on Calcium, MVI, Vit D. ~  labs 5/11 showed Vit D level = 41... continue 1000 u daily. ~  Labs 4/13 showed Vit D level =   Hx of RESTLESS LEGS SYNDROME (ICD-333.94) - she tried Requip 1mg  Qhs in the past> now off...  ANXIETY (ICD-300.00) - she states that she stopped the Alprazolam (prev 0.5mg  Prn)... ~  10/09 states depressed- 2 sisters died February 02, 2023- one w/ ca pancreas, other w/ DM/ ASPVD.Marland Kitchen. tried  Zofoft transiently then stopped.  HEALTH MAINTENANCE:  she sees DrMezer et al for GYN, neg mammogram 3/12... she gets the yearly seasonal Flu vaccines, and had the PNEUMOVAX in 2005 at age 76...   Past Surgical History  Procedure Date  . Total hip arthroplasty 2/05    right by Dr. Shelle Iron  . Cataract extraction   . Upper gastrointestinal endoscopy   . Colonoscopy   . Retina surgery x 2     Outpatient Encounter Prescriptions as of 02/01/2012  Medication Sig Dispense Refill  . aspirin 81 MG tablet Take 81 mg by mouth daily.        Marland Kitchen  atorvastatin (LIPITOR) 20 MG tablet TAKE 1 TABLET BY MOUTH ONCE DAILY  30 tablet  5  . calcium-vitamin D (OSCAL WITH D) 500-200 MG-UNIT per tablet Take 1 tablet by mouth daily.        . Cholecalciferol (VITAMIN D3) 1000 UNITS CAPS Take 1 capsule by mouth daily.        Marland Kitchen ibuprofen (ADVIL) 200 MG tablet as needed.        Marland Kitchen levobunolol (BETAGAN) 0.5 % ophthalmic solution Place 1 drop into both eyes at bedtime.        Marland Kitchen losartan-hydrochlorothiazide (HYZAAR) 100-12.5 MG per tablet take 1 tablet by mouth once daily  30 tablet  4  . metoprolol (LOPRESSOR) 50 MG tablet take 1 tablet by mouth once daily  30 tablet  5  . Multiple Vitamin (MULTIVITAMIN) tablet Take 1 tablet by mouth daily.        . polyethylene glycol powder (MIRALAX) powder Take 17 g by mouth as needed.       . travoprost, benzalkonium, (TRAVATAN) 0.004 % ophthalmic solution daily.       Marland Kitchen DISCONTD: latanoprost (XALATAN) 0.005 % ophthalmic solution Place 1 drop into both eyes 2 (two) times daily.       . hydrocortisone (ANUSOL-HC) 25 MG suppository Place 1 suppository (25 mg total) rectally daily.  12 suppository  1  . hydrocortisone 2.5 % cream APPLY TOPICALLY 2 TIMES A DAY  30 g  0  . DISCONTD: ketorolac (ACULAR) 0.5 % ophthalmic solution       . DISCONTD: ofloxacin (OCUFLOX) 0.3 % ophthalmic solution       . DISCONTD: pilocarpine (PILOCAR) 1 % ophthalmic solution        Facility-Administered  Encounter Medications as of 02/01/2012  Medication Dose Route Frequency Provider Last Rate Last Dose  . 0.9 %  sodium chloride infusion  500 mL Intravenous Continuous Hart Carwin, MD        No Known Allergies   Current Medications, Allergies, Past Medical History, Past Surgical History, Family History, and Social History were reviewed in [employer] record.    Review of Systems       Constitutional:   No  weight loss, night sweats,  Fevers, chills, fatigue, or  lassitude.  HEENT:   No headaches,  Difficulty swallowing,  Tooth/dental problems, or  Sore throat,                No sneezing, itching, ear ache, nasal congestion, post nasal drip,   CV:  No chest pain,  Orthopnea, PND, swelling in lower extremities, anasarca, dizziness, palpitations, syncope.   GI  No heartburn, indigestion, abdominal pain, nausea, vomiting, diarrhea, change in bowel habits, loss of appetite, bloody stools.   Resp: No shortness of breath with exertion or at rest.  No excess mucus, no productive cough,  No non-productive cough,  No coughing up of blood.  No change in color of mucus.  No wheezing.  No chest wall deformity  Skin: no rash or lesions.  GU: no dysuria, change in color of urine, no urgency or frequency.  No flank pain, no hematuria   MS:  No joint pain or swelling.  No decreased range of motion.  No back pain.  Psych:  No change in mood or affect. No depression or anxiety.  No memory loss.       Objective:   Physical Exam     WD, WN, 78 y/o BF in NAD... GENERAL:  Alert &  oriented; pleasant & cooperative. HEENT:  [place]/AT, EOM-wnl, PERRLA, EACs-clear, TMs-wnl, NOSE-clear, THROAT-clear & wnl. NECK:  Supple w/ fairROM; no JVD; normal carotid impulses w/o bruits; no thyromegaly or nodules palpated; no lymphadenopathy. CHEST:  Clear to P & A; without wheezes/ rales/ or rhonchi. HEART: Irregular ;  gr 1/6 SEM without rubs or gallops heard... ABDOMEN:  Soft & nontender;  normal bowel sounds; no organomegaly or masses detected. EXT: without deformities, mod arthritic changes; no varicose veins/ +venous insuffic/ tr edema. NEURO:  CN's intact;  no focal neuro deficits... DERM:  No lesions noted; no rash etc...  EKG : A-Fib ~90   Assessment & Plan:

## 2012-02-01 NOTE — Progress Notes (Signed)
Addended by: Fenton Foy on: 02/01/2012 03:16 PM   Modules accepted: Orders

## 2012-02-04 ENCOUNTER — Encounter: Payer: Self-pay | Admitting: Cardiology

## 2012-02-04 ENCOUNTER — Ambulatory Visit (INDEPENDENT_AMBULATORY_CARE_PROVIDER_SITE_OTHER): Payer: Medicare Other | Admitting: Cardiology

## 2012-02-04 VITALS — BP 138/84 | HR 93 | Ht 66.5 in | Wt 130.8 lb

## 2012-02-04 DIAGNOSIS — H59029 Cataract (lens) fragments in eye following cataract surgery, unspecified eye: Secondary | ICD-10-CM | POA: Diagnosis not present

## 2012-02-04 DIAGNOSIS — H4011X0 Primary open-angle glaucoma, stage unspecified: Secondary | ICD-10-CM | POA: Diagnosis not present

## 2012-02-04 DIAGNOSIS — K649 Unspecified hemorrhoids: Secondary | ICD-10-CM | POA: Diagnosis not present

## 2012-02-04 DIAGNOSIS — E7800 Pure hypercholesterolemia, unspecified: Secondary | ICD-10-CM

## 2012-02-04 DIAGNOSIS — F17200 Nicotine dependence, unspecified, uncomplicated: Secondary | ICD-10-CM | POA: Diagnosis not present

## 2012-02-04 DIAGNOSIS — H353290 Exudative age-related macular degeneration, unspecified eye, stage unspecified: Secondary | ICD-10-CM | POA: Diagnosis not present

## 2012-02-04 DIAGNOSIS — I4891 Unspecified atrial fibrillation: Secondary | ICD-10-CM

## 2012-02-04 DIAGNOSIS — I10 Essential (primary) hypertension: Secondary | ICD-10-CM

## 2012-02-04 MED ORDER — WARFARIN SODIUM 2.5 MG PO TABS: 2.5000 mg | ORAL_TABLET | Freq: Every day | ORAL | Status: DC

## 2012-02-04 NOTE — Patient Instructions (Signed)
Your physician recommends that you schedule a follow-up appointment in: 2 WEEKS  Your physician has requested that you have an echocardiogram. Echocardiography is a painless test that uses sound waves to create images of your heart. It provides your doctor with information about the size and shape of your heart and how well your heart's chambers and valves are working. This procedure takes approximately one hour. There are no restrictions for this procedure.   START WARFARIN SODIUM 2.5 MG ONE TABLET ONCE DAILY IN THE AFTERNOON WITH FOOD  COME TO THE [facility] Thursday 02-07-12

## 2012-02-07 ENCOUNTER — Encounter (INDEPENDENT_AMBULATORY_CARE_PROVIDER_SITE_OTHER): Payer: Medicare Other | Admitting: *Deleted

## 2012-02-07 ENCOUNTER — Ambulatory Visit (INDEPENDENT_AMBULATORY_CARE_PROVIDER_SITE_OTHER): Payer: Medicare Other | Admitting: *Deleted

## 2012-02-07 ENCOUNTER — Ambulatory Visit (HOSPITAL_COMMUNITY): Payer: Medicare Other | Attending: Cardiology

## 2012-02-07 DIAGNOSIS — R0989 Other specified symptoms and signs involving the circulatory and respiratory systems: Secondary | ICD-10-CM

## 2012-02-07 DIAGNOSIS — I4891 Unspecified atrial fibrillation: Secondary | ICD-10-CM

## 2012-02-07 DIAGNOSIS — I10 Essential (primary) hypertension: Secondary | ICD-10-CM | POA: Insufficient documentation

## 2012-02-07 DIAGNOSIS — E785 Hyperlipidemia, unspecified: Secondary | ICD-10-CM | POA: Insufficient documentation

## 2012-02-07 DIAGNOSIS — F17200 Nicotine dependence, unspecified, uncomplicated: Secondary | ICD-10-CM | POA: Insufficient documentation

## 2012-02-07 DIAGNOSIS — I059 Rheumatic mitral valve disease, unspecified: Secondary | ICD-10-CM | POA: Insufficient documentation

## 2012-02-07 DIAGNOSIS — R5381 Other malaise: Secondary | ICD-10-CM | POA: Diagnosis not present

## 2012-02-07 DIAGNOSIS — R5383 Other fatigue: Secondary | ICD-10-CM | POA: Insufficient documentation

## 2012-02-07 DIAGNOSIS — Z7901 Long term (current) use of anticoagulants: Secondary | ICD-10-CM | POA: Diagnosis not present

## 2012-02-07 NOTE — Progress Notes (Signed)
Echocardiogram performed.  

## 2012-02-07 NOTE — Patient Instructions (Addendum)

## 2012-02-07 NOTE — Progress Notes (Signed)
This encounter was created in error - please disregard.

## 2012-02-14 ENCOUNTER — Ambulatory Visit (INDEPENDENT_AMBULATORY_CARE_PROVIDER_SITE_OTHER): Payer: Medicare Other | Admitting: *Deleted

## 2012-02-14 DIAGNOSIS — I4891 Unspecified atrial fibrillation: Secondary | ICD-10-CM | POA: Diagnosis not present

## 2012-02-14 DIAGNOSIS — Z7901 Long term (current) use of anticoagulants: Secondary | ICD-10-CM | POA: Diagnosis not present

## 2012-02-20 ENCOUNTER — Ambulatory Visit (INDEPENDENT_AMBULATORY_CARE_PROVIDER_SITE_OTHER): Payer: Medicare Other | Admitting: Cardiology

## 2012-02-20 ENCOUNTER — Encounter: Payer: Self-pay | Admitting: Cardiology

## 2012-02-20 ENCOUNTER — Ambulatory Visit (INDEPENDENT_AMBULATORY_CARE_PROVIDER_SITE_OTHER): Payer: Medicare Other | Admitting: *Deleted

## 2012-02-20 VITALS — BP 178/100 | HR 93 | Ht 66.5 in | Wt 129.0 lb

## 2012-02-20 DIAGNOSIS — Z7901 Long term (current) use of anticoagulants: Secondary | ICD-10-CM

## 2012-02-20 DIAGNOSIS — I10 Essential (primary) hypertension: Secondary | ICD-10-CM

## 2012-02-20 DIAGNOSIS — I4891 Unspecified atrial fibrillation: Secondary | ICD-10-CM | POA: Diagnosis not present

## 2012-02-20 MED ORDER — METOPROLOL TARTRATE 50 MG PO TABS: 50.0000 mg | ORAL_TABLET | Freq: Two times a day (BID) | ORAL | Status: DC

## 2012-02-20 NOTE — Assessment & Plan Note (Signed)
LDL 82.  Managed by Dr. Kriste Basque and Rikki Spearing.

## 2012-02-20 NOTE — Assessment & Plan Note (Addendum)
Slowly getting anticoagulated.  Currently taking 50mg  metop in am and 25mg  in pm.  With elevated HR and BP, will increase to 50mg  twice daily.  Hopefully this will slow the rate.  No bleeding to date.  Will watch for any signs.

## 2012-02-20 NOTE — Patient Instructions (Addendum)
Your physician recommends that you schedule a follow-up appointment in: 4 WEEKS   Your physician has recommended you make the following change in your medication: INCREASE Metoprolol to 50mg  take one tablet by mouth twice a day  Your physician has requested that you regularly monitor and record your blood pressure readings at home. Please use the same machine at the same time of day to check your readings and record them to bring to your follow-up visit.

## 2012-02-20 NOTE — Assessment & Plan Note (Signed)
Elevated today.  Will increase meds as noted.  She has a home cuff and will start checking so we have a bettter sense.

## 2012-02-20 NOTE — Progress Notes (Signed)
HPI:  The patient is seen as an add on new patient for atrial fib.  Sees Dr. Kriste Basque on a regular basis, and recently saw Rikki Spearing, who noted the patient was in atrial fib.  The patient is older, lives alone, and a man calls to check on her every day.  She still smokes, and sees Dr. Chevis Pretty in GYN.   He noted the irregular rhythm and sent her over to Dr. Jodelle Green office for a check.  Overall she does pretty well, but does feel woozy at times.  Denies syncope or presyncope.  Has had HTN for many years, but no diabetes.  No definite exercise intolerance.  Has had eye surgery, but not other major surgical procedures.  She has had hemorrhoids, but she is not anemic.  Her TSH has been normal.  She has not had an echocardiogram in the past.    Current Outpatient Prescriptions  Medication Sig Dispense Refill  . aspirin 81 MG tablet Take 81 mg by mouth daily.        Marland Kitchen atorvastatin (LIPITOR) 20 MG tablet TAKE 1 TABLET BY MOUTH ONCE DAILY  30 tablet  5  . calcium-vitamin D (OSCAL WITH D) 500-200 MG-UNIT per tablet Take 1 tablet by mouth daily.        . Cholecalciferol (VITAMIN D3) 1000 UNITS CAPS Take 1 capsule by mouth daily.        Marland Kitchen ibuprofen (ADVIL) 200 MG tablet as needed.        . latanoprost (XALATAN) 0.005 % ophthalmic solution Place 1 drop into the left eye 3 (three) times daily.      Marland Kitchen levobunolol (BETAGAN) 0.5 % ophthalmic solution Place 1 drop into both eyes at bedtime.        Marland Kitchen losartan-hydrochlorothiazide (HYZAAR) 100-12.5 MG per tablet take 1 tablet by mouth once daily  30 tablet  4  . metoprolol (LOPRESSOR) 50 MG tablet take 1 tablet by mouth once daily  30 tablet  5  . Multiple Vitamin (MULTIVITAMIN) tablet Take 1 tablet by mouth daily.        . polyethylene glycol powder (MIRALAX) powder Take 17 g by mouth as needed.       . travoprost, benzalkonium, (TRAVATAN) 0.004 % ophthalmic solution daily.       . hydrocortisone (ANUSOL-HC) 25 MG suppository Place 1 suppository (25 mg total)  rectally daily.  12 suppository  1  . latanoprost (XALATAN) 0.005 % ophthalmic solution       . warfarin (COUMADIN) 2.5 MG tablet Take 1 tablet (2.5 mg total) by mouth daily.  30 tablet  6   Current Facility-Administered Medications  Medication Dose Route Frequency Provider Last Rate Last Dose  . 0.9 %  sodium chloride infusion  500 mL Intravenous Continuous Hart Carwin, MD        No Known Allergies  Past Medical History  Diagnosis Date  . Hypertension   . Hyperlipidemia   . Glaucoma   . Cigarette smoker   . Dyspepsia   . Diverticulosis of colon   . Colonic polyp   . Family history of colon cancer   . Hemorrhoids   . Other specified disorder of rectum and anus   . Renal cyst   . DJD (degenerative joint disease)   . Osteopenia   . Vitamin d deficiency   . Restless leg syndrome   . Anxiety   . Depression     Past Surgical History  Procedure Date  . Total hip  arthroplasty 2/05    right by Dr. Shelle Iron  . Cataract extraction   . Upper gastrointestinal endoscopy   . Colonoscopy   . Retina surgery x 2     Family History  Problem Relation Age of Onset  . Hypertension Mother   . Diabetes Mother   . Breast cancer Other     niece  . Colon cancer Brother   . Prostate cancer Father   . Diabetes Brother   . Diabetes Sister     History   Social History  . Marital Status: Widowed    Spouse Name: N/A    Number of Children: 0  . Years of Education: N/A   Occupational History  . Retired    Social History Main Topics  . Smoking status: Current Everyday Smoker -- 0.3 packs/day  . Smokeless tobacco: Never Used   Comment: 6 cig a day  . Alcohol Use: No  . Drug Use: No  . Sexually Active: Not on file   Other Topics Concern  . Not on file   Social History Narrative   Exercises 2 times per weekCaffeine use: 1 cup coffee per day    ROS: Please see the HPI.  All other systems reviewed and negative.  PHYSICAL EXAM:  BP 138/84  Pulse 93  Ht 5' 6.5" (1.689 m)   Wt 130 lb 12.8 oz (59.33 kg)  BMI 20.80 kg/m2  General: Thin, older woman, no apparent distress.  . Head:  Normocephalic and atraumatic. Neck: no JVD Lungs: Some prolongation of expiration without rales or wheezes.  No dullness to percussion.  Heart: Normal S1 and S2.  irreg irreg rhythm, controlled rate.    Abdomen:  Normal bowel sounds; soft; non tender; no organomegaly.  Very thin.   Pulses: Pulses normal in all 4 extremities. Extremities: No clubbing or cyanosis. No edema. Neurologic: Alert and oriented x 3.  EKG:  Atrial fib.  Nonspecific T wave abnormality.   ASSESSMENT AND PLAN:

## 2012-02-20 NOTE — Assessment & Plan Note (Signed)
Down to 2-3 per day.

## 2012-02-20 NOTE — Assessment & Plan Note (Signed)
Reasonable control on meds.  AF risk factor for Thromboemboli.

## 2012-02-20 NOTE — Assessment & Plan Note (Signed)
Will need to monitor during initiation of warfarin.  Will need follow up CBC when seen back.  Told to report if bleeding.

## 2012-02-20 NOTE — Progress Notes (Signed)
HPI:  The patient is seen in followup. She actually is doing pretty well. She noted that her blood pressure somewhat higher today. Her INR. has slowly risen now up to 1.4 she denies any chest pain or shortness of breath.  Current Outpatient Prescriptions  Medication Sig Dispense Refill  . aspirin 81 MG tablet Take 81 mg by mouth daily.        Marland Kitchen atorvastatin (LIPITOR) 20 MG tablet TAKE 1 TABLET BY MOUTH ONCE DAILY  30 tablet  5  . calcium-vitamin D (OSCAL WITH D) 500-200 MG-UNIT per tablet Take 1 tablet by mouth daily.        . Cholecalciferol (VITAMIN D3) 1000 UNITS CAPS Take 1 capsule by mouth daily.        . hydrocortisone (ANUSOL-HC) 25 MG suppository Place 1 suppository (25 mg total) rectally daily.  12 suppository  1  . ibuprofen (ADVIL) 200 MG tablet as needed.        . latanoprost (XALATAN) 0.005 % ophthalmic solution Place 1 drop into the left eye 3 (three) times daily.      Marland Kitchen levobunolol (BETAGAN) 0.5 % ophthalmic solution Place 1 drop into both eyes at bedtime.        Marland Kitchen losartan-hydrochlorothiazide (HYZAAR) 100-12.5 MG per tablet take 1 tablet by mouth once daily  30 tablet  4  . metoprolol (LOPRESSOR) 50 MG tablet take 1 tablet by mouth once daily  30 tablet  5  . Multiple Vitamin (MULTIVITAMIN) tablet Take 1 tablet by mouth daily.        . polyethylene glycol powder (MIRALAX) powder Take 17 g by mouth as needed.       . travoprost, benzalkonium, (TRAVATAN) 0.004 % ophthalmic solution daily.       Marland Kitchen warfarin (COUMADIN) 2.5 MG tablet Take 1 tablet (2.5 mg total) by mouth daily.  30 tablet  6   Current Facility-Administered Medications  Medication Dose Route Frequency Provider Last Rate Last Dose  . 0.9 %  sodium chloride infusion  500 mL Intravenous Continuous Hart Carwin, MD        No Known Allergies  Past Medical History  Diagnosis Date  . Hypertension   . Hyperlipidemia   . Glaucoma   . Cigarette smoker   . Dyspepsia   . Diverticulosis of colon   . Colonic polyp     . Family history of colon cancer   . Hemorrhoids   . Other specified disorder of rectum and anus   . Renal cyst   . DJD (degenerative joint disease)   . Osteopenia   . Vitamin d deficiency   . Restless leg syndrome   . Anxiety   . Depression     Past Surgical History  Procedure Date  . Total hip arthroplasty 2/05    right by Dr. Shelle Iron  . Cataract extraction   . Upper gastrointestinal endoscopy   . Colonoscopy   . Retina surgery x 2     Family History  Problem Relation Age of Onset  . Hypertension Mother   . Diabetes Mother   . Breast cancer Other     niece  . Colon cancer Brother   . Prostate cancer Father   . Diabetes Brother   . Diabetes Sister     History   Social History  . Marital Status: Widowed    Spouse Name: N/A    Number of Children: 0  . Years of Education: N/A   Occupational History  . Retired  Social History Main Topics  . Smoking status: Current Everyday Smoker -- 0.3 packs/day  . Smokeless tobacco: Never Used   Comment: 6 cig a day  . Alcohol Use: No  . Drug Use: No  . Sexually Active: Not on file   Other Topics Concern  . Not on file   Social History Narrative   Exercises 2 times per weekCaffeine use: 1 cup coffee per day    ROS: Please see the HPI.  All other systems reviewed and negative.  PHYSICAL EXAM:  BP 178/100  Pulse 93  Ht 5' 6.5" (1.689 m)  Wt 129 lb (58.514 kg)  BMI 20.51 kg/m2 Repeat BP 150/90  General: Well developed, well nourished, in no acute distress. Head:  Normocephalic and atraumatic. Neck: no JVD Lungs: Clear to auscultation and percussion. Heart:irregularly irregular rhythm.   Abdomen:  Normal bowel sounds; soft; non tender; no organomegaly Pulses: Pulses normal in all 4 extremities. Extremities: No clubbing or cyanosis. No edema. Neurologic: Alert and oriented x 3.  EKG:  Atrial fib with controlled ventricular response.  Nonspecific T flattening.   ASSESSMENT AND PLAN:

## 2012-02-20 NOTE — Assessment & Plan Note (Signed)
New onset.  TSH normal.  No obvious precipitors.  Has hemorrhoids, but normal CBC.  Has history of HTN.  CHADS 2 Vasc2 and CHADS scores reviewed with patient in detail  Given the findings, she is at risk for thromboembolic events   (mainly age, gender, HTN).  Coumadin risks and benefits discussed in detail.  She will need close monitoring in warfarin clinic.  Rate is controlled.  Could consider one attempt at cardioversion.  However, she feels well.  Will get echo, and schedule coumadin clinic visits and follow up here.

## 2012-02-27 ENCOUNTER — Ambulatory Visit (INDEPENDENT_AMBULATORY_CARE_PROVIDER_SITE_OTHER): Payer: Medicare Other | Admitting: *Deleted

## 2012-02-27 DIAGNOSIS — I4891 Unspecified atrial fibrillation: Secondary | ICD-10-CM

## 2012-02-27 DIAGNOSIS — Z7901 Long term (current) use of anticoagulants: Secondary | ICD-10-CM

## 2012-03-05 ENCOUNTER — Ambulatory Visit (INDEPENDENT_AMBULATORY_CARE_PROVIDER_SITE_OTHER): Payer: Medicare Other | Admitting: *Deleted

## 2012-03-05 DIAGNOSIS — Z7901 Long term (current) use of anticoagulants: Secondary | ICD-10-CM | POA: Diagnosis not present

## 2012-03-05 DIAGNOSIS — I4891 Unspecified atrial fibrillation: Secondary | ICD-10-CM | POA: Diagnosis not present

## 2012-03-19 ENCOUNTER — Ambulatory Visit (INDEPENDENT_AMBULATORY_CARE_PROVIDER_SITE_OTHER): Payer: Medicare Other | Admitting: *Deleted

## 2012-03-19 ENCOUNTER — Encounter: Payer: Self-pay | Admitting: Cardiology

## 2012-03-19 ENCOUNTER — Ambulatory Visit (INDEPENDENT_AMBULATORY_CARE_PROVIDER_SITE_OTHER): Payer: Medicare Other | Admitting: Cardiology

## 2012-03-19 VITALS — BP 137/87 | HR 97 | Ht 66.0 in | Wt 129.0 lb

## 2012-03-19 DIAGNOSIS — I10 Essential (primary) hypertension: Secondary | ICD-10-CM

## 2012-03-19 DIAGNOSIS — Z7901 Long term (current) use of anticoagulants: Secondary | ICD-10-CM

## 2012-03-19 DIAGNOSIS — I4891 Unspecified atrial fibrillation: Secondary | ICD-10-CM

## 2012-03-19 NOTE — Assessment & Plan Note (Signed)
Improved overall.  BP down

## 2012-03-19 NOTE — Progress Notes (Signed)
HPI:  Is doing very well. She'll having any major problems she is tolerating the medication changes. She denies any chest pain.  Current Outpatient Prescriptions  Medication Sig Dispense Refill  . aspirin 81 MG tablet Take 81 mg by mouth daily.        Marland Kitchen atorvastatin (LIPITOR) 20 MG tablet TAKE 1 TABLET BY MOUTH ONCE DAILY  30 tablet  5  . calcium-vitamin D (OSCAL WITH D) 500-200 MG-UNIT per tablet Take 1 tablet by mouth daily.        . Cholecalciferol (VITAMIN D3) 1000 UNITS CAPS Take 1 capsule by mouth daily.        . hydrocortisone (ANUSOL-HC) 25 MG suppository Place 1 suppository (25 mg total) rectally daily.  12 suppository  1  . ibuprofen (ADVIL) 200 MG tablet as needed.        . latanoprost (XALATAN) 0.005 % ophthalmic solution Place 1 drop into the left eye 3 (three) times daily.      Marland Kitchen levobunolol (BETAGAN) 0.5 % ophthalmic solution Place 1 drop into both eyes at bedtime.        Marland Kitchen losartan-hydrochlorothiazide (HYZAAR) 100-12.5 MG per tablet take 1 tablet by mouth once daily  30 tablet  4  . metoprolol (LOPRESSOR) 50 MG tablet Take 1 tablet (50 mg total) by mouth 2 (two) times daily.  60 tablet  5  . Multiple Vitamin (MULTIVITAMIN) tablet Take 1 tablet by mouth daily.        . polyethylene glycol powder (MIRALAX) powder Take 17 g by mouth as needed.       . travoprost, benzalkonium, (TRAVATAN) 0.004 % ophthalmic solution daily.       Marland Kitchen warfarin (COUMADIN) 2.5 MG tablet Take 1 tablet (2.5 mg total) by mouth daily.  30 tablet  6   Current Facility-Administered Medications  Medication Dose Route Frequency Provider Last Rate Last Dose  . 0.9 %  sodium chloride infusion  500 mL Intravenous Continuous Hart Carwin, MD        No Known Allergies  Past Medical History  Diagnosis Date  . Hypertension   . Hyperlipidemia   . Glaucoma   . Cigarette smoker   . Dyspepsia   . Diverticulosis of colon   . Colonic polyp   . Family history of colon cancer   . Hemorrhoids   . Other  specified disorder of rectum and anus   . Renal cyst   . DJD (degenerative joint disease)   . Osteopenia   . Vitamin d deficiency   . Restless leg syndrome   . Anxiety   . Depression     Past Surgical History  Procedure Date  . Total hip arthroplasty 2/05    right by Dr. Shelle Iron  . Cataract extraction   . Upper gastrointestinal endoscopy   . Colonoscopy   . Retina surgery x 2     Family History  Problem Relation Age of Onset  . Hypertension Mother   . Diabetes Mother   . Breast cancer Other     niece  . Colon cancer Brother   . Prostate cancer Father   . Diabetes Brother   . Diabetes Sister     History   Social History  . Marital Status: Widowed    Spouse Name: N/A    Number of Children: 0  . Years of Education: N/A   Occupational History  . Retired    Social History Main Topics  . Smoking status: Current Everyday Smoker --  0.3 packs/day  . Smokeless tobacco: Never Used   Comment: 6 cig a day  . Alcohol Use: No  . Drug Use: No  . Sexually Active: Not on file   Other Topics Concern  . Not on file   Social History Narrative   Exercises 2 times per weekCaffeine use: 1 cup coffee per day    ROS: Please see the HPI.  All other systems reviewed and negative.  PHYSICAL EXAM:  BP 137/87  Pulse 97  Ht 5\' 6"  (1.676 m)  Wt 129 lb (58.514 kg)  BMI 20.82 kg/m2  General: Well developed, well nourished, in no acute distress. Head:  Normocephalic and atraumatic. Neck: no JVD Lungs: Clear to auscultation and percussion. Heart: irregularly irregular rhythm.  Pulses: Pulses normal in all 4 extremities. Extremities: No clubbing or cyanosis. No edema. Neurologic: Alert and oriented x 3.  EKG:  Atrial fibrillation.  Nonspecific T wave abnormality.    ASSESSMENT AND PLAN:

## 2012-03-19 NOTE — Patient Instructions (Addendum)
Your physician recommends that you continue on your current medications as directed. Please refer to the Current Medication list given to you today.  Your physician recommends that you schedule a follow-up appointment in: 1 MONTH with Dr Riley Kill

## 2012-03-19 NOTE — Assessment & Plan Note (Addendum)
Well controlled rate.  Tolerating meds.  We discussed the various strategies with regard to treatment. He's include cardioversion, and gone over this with her in detail. He other option would be to continue to leave her in atrial fibrillation. We will be seeing her back in close followup, and further plans made at that time.

## 2012-03-30 ENCOUNTER — Other Ambulatory Visit: Payer: Self-pay | Admitting: Pulmonary Disease

## 2012-04-07 DIAGNOSIS — H278 Other specified disorders of lens: Secondary | ICD-10-CM | POA: Diagnosis not present

## 2012-04-09 ENCOUNTER — Ambulatory Visit (INDEPENDENT_AMBULATORY_CARE_PROVIDER_SITE_OTHER): Payer: Medicare Other | Admitting: Pharmacist

## 2012-04-09 DIAGNOSIS — I4891 Unspecified atrial fibrillation: Secondary | ICD-10-CM

## 2012-04-09 DIAGNOSIS — Z7901 Long term (current) use of anticoagulants: Secondary | ICD-10-CM | POA: Diagnosis not present

## 2012-04-09 LAB — POCT INR: INR: 2.7

## 2012-04-15 ENCOUNTER — Ambulatory Visit: Payer: Medicare Other | Admitting: Cardiology

## 2012-04-17 ENCOUNTER — Ambulatory Visit (INDEPENDENT_AMBULATORY_CARE_PROVIDER_SITE_OTHER): Payer: Medicare Other | Admitting: Cardiology

## 2012-04-17 ENCOUNTER — Encounter: Payer: Self-pay | Admitting: Cardiology

## 2012-04-17 VITALS — BP 130/90 | HR 97 | Ht 66.5 in | Wt 130.8 lb

## 2012-04-17 DIAGNOSIS — I4891 Unspecified atrial fibrillation: Secondary | ICD-10-CM | POA: Diagnosis not present

## 2012-04-17 NOTE — Progress Notes (Signed)
HPI:  The patient notices that she feels tired. However she is otherwise stable. We discussed in great length today the procedure cardioversion which she and I discussed in the past. She would prefer to go ahead with this approach. We discussed the options of continued rate control approach versus an attempt at least 41 cardioversion. She denies any ongoing chest pain. She's not had any unusual bleeding. The procedure itself was explained the patient how it is performed. Risks were also discussed.  Current Outpatient Prescriptions  Medication Sig Dispense Refill  . aspirin 81 MG tablet Take 81 mg by mouth daily.        Marland Kitchen atorvastatin (LIPITOR) 20 MG tablet TAKE 1 TABLET BY MOUTH ONCE DAILY  30 tablet  5  . calcium-vitamin D (OSCAL WITH D) 500-200 MG-UNIT per tablet Take 1 tablet by mouth daily.        . Cholecalciferol (VITAMIN D3) 1000 UNITS CAPS Take 1 capsule by mouth daily.        . hydrocortisone (ANUSOL-HC) 25 MG suppository Place 1 suppository (25 mg total) rectally daily.  12 suppository  1  . ibuprofen (ADVIL) 200 MG tablet as needed.        . latanoprost (XALATAN) 0.005 % ophthalmic solution Place 1 drop into the left eye 3 (three) times daily.      Marland Kitchen levobunolol (BETAGAN) 0.5 % ophthalmic solution Place 1 drop into both eyes at bedtime.        Marland Kitchen losartan-hydrochlorothiazide (HYZAAR) 100-12.5 MG per tablet take 1 tablet by mouth once daily  30 tablet  4  . metoprolol (LOPRESSOR) 50 MG tablet Take 1 tablet (50 mg total) by mouth 2 (two) times daily.  60 tablet  5  . Multiple Vitamin (MULTIVITAMIN) tablet Take 1 tablet by mouth daily.        . polyethylene glycol powder (MIRALAX) powder Take 17 g by mouth as needed.       . travoprost, benzalkonium, (TRAVATAN) 0.004 % ophthalmic solution daily.       Marland Kitchen warfarin (COUMADIN) 2.5 MG tablet Take 1 tablet (2.5 mg total) by mouth daily.  30 tablet  6   Current Facility-Administered Medications  Medication Dose Route Frequency Provider Last  Rate Last Dose  . 0.9 %  sodium chloride infusion  500 mL Intravenous Continuous Hart Carwin, MD        No Known Allergies  Past Medical History  Diagnosis Date  . Hypertension   . Hyperlipidemia   . Glaucoma   . Cigarette smoker   . Dyspepsia   . Diverticulosis of colon   . Colonic polyp   . Family history of colon cancer   . Hemorrhoids   . Other specified disorder of rectum and anus   . Renal cyst   . DJD (degenerative joint disease)   . Osteopenia   . Vitamin d deficiency   . Restless leg syndrome   . Anxiety   . Depression     Past Surgical History  Procedure Date  . Total hip arthroplasty 2/05    right by Dr. Shelle Iron  . Cataract extraction   . Upper gastrointestinal endoscopy   . Colonoscopy   . Retina surgery x 2     Family History  Problem Relation Age of Onset  . Hypertension Mother   . Diabetes Mother   . Breast cancer Other     niece  . Colon cancer Brother   . Prostate cancer Father   . Diabetes  Brother   . Diabetes Sister     History   Social History  . Marital Status: Widowed    Spouse Name: N/A    Number of Children: 0  . Years of Education: N/A   Occupational History  . Retired    Social History Main Topics  . Smoking status: Current Everyday Smoker -- 0.3 packs/day  . Smokeless tobacco: Never Used   Comment: 6 cig a day  . Alcohol Use: No  . Drug Use: No  . Sexually Active: Not on file   Other Topics Concern  . Not on file   Social History Narrative   Exercises 2 times per weekCaffeine use: 1 cup coffee per day    ROS: Please see the HPI.  All other systems reviewed and negative.  PHYSICAL EXAM:  BP 130/90  Pulse 97  Ht 5' 6.5" (1.689 m)  Wt 130 lb 12.8 oz (59.33 kg)  BMI 20.80 kg/m2  General: Thin, elderly woman in no acute distress.   Head:  Normocephalic and atraumatic. Neck: no JVD Lungs: Clear to auscultation and percussion. Heart: irregularly irregular rhythm.  No def murmur.   Pulses: Pulses normal in  all 4 extremities. Extremities: No clubbing or cyanosis. No edema. Neurologic: Alert and oriented x 3.  EKG:  Atrial fib with controlled ventricular response.  No acute changes.    ASSESSMENT AND PLAN:

## 2012-04-17 NOTE — Patient Instructions (Addendum)
Your physician has recommended that you have a Cardioversion (DCCV). Electrical Cardioversion uses a jolt of electricity to your heart either through paddles or wired patches attached to your chest. This is a controlled, usually prescheduled, procedure. Defibrillation is done under light anesthesia in the hospital, and you usually go home the day of the procedure. This is done to get your heart back into a normal rhythm. You are not awake for the procedure. Please see the instruction sheet given to you today.  Your physician recommends that you continue on your current medications as directed. Please refer to the Current Medication list given to you today.  Your physician recommends that you return for lab work on: Tuesday, 04/22/12--BMP, CBC, PTT, PT/INR  Your physician recommends that you schedule a follow-up appointment in: 4 WEEKS with Dr Riley Kill

## 2012-04-17 NOTE — Assessment & Plan Note (Signed)
Has been well controlled. She's been on anticoagulant therapy, and had a therapeutic INR. We discussed cardioversion, and the patient is agreeable to proceed.  The risks and alternatives, including a strategy of continued rate control, have been discussed with the patient she would like at least one attempt to return to sinus rhythm.

## 2012-04-22 ENCOUNTER — Ambulatory Visit (INDEPENDENT_AMBULATORY_CARE_PROVIDER_SITE_OTHER): Payer: Medicare Other | Admitting: *Deleted

## 2012-04-22 DIAGNOSIS — I4891 Unspecified atrial fibrillation: Secondary | ICD-10-CM

## 2012-04-22 LAB — CBC WITH DIFFERENTIAL/PLATELET
Basophils Relative: 0.5 % (ref 0.0–3.0)
Eosinophils Absolute: 0.2 10*3/uL (ref 0.0–0.7)
Eosinophils Relative: 3.2 % (ref 0.0–5.0)
HCT: 41.8 % (ref 36.0–46.0)
Hemoglobin: 13.5 g/dL (ref 12.0–15.0)
MCHC: 32.4 g/dL (ref 30.0–36.0)
MCV: 92.7 fl (ref 78.0–100.0)
Monocytes Absolute: 0.6 10*3/uL (ref 0.1–1.0)
Neutro Abs: 2.8 10*3/uL (ref 1.4–7.7)
RBC: 4.51 Mil/uL (ref 3.87–5.11)
WBC: 5.2 10*3/uL (ref 4.5–10.5)

## 2012-04-22 LAB — BASIC METABOLIC PANEL
BUN: 14 mg/dL (ref 6–23)
Creatinine, Ser: 0.9 mg/dL (ref 0.4–1.2)
GFR: 82.41 mL/min (ref >60.00)
Glucose, Bld: 89 mg/dL (ref 70–99)
Potassium: 3.6 mEq/L (ref 3.5–5.1)

## 2012-04-22 LAB — PROTIME-INR: Prothrombin Time: 26.3 s — ABNORMAL HIGH (ref 10.2–12.4)

## 2012-04-23 ENCOUNTER — Encounter (HOSPITAL_COMMUNITY): Payer: Self-pay | Admitting: Anesthesiology

## 2012-04-23 ENCOUNTER — Ambulatory Visit (HOSPITAL_COMMUNITY)
Admission: RE | Admit: 2012-04-23 | Discharge: 2012-04-23 | Disposition: A | Discharge status: Home / Self Care | Payer: Medicare Other | Source: Ambulatory Visit | Attending: Cardiology | Admitting: Cardiology

## 2012-04-23 ENCOUNTER — Ambulatory Visit (HOSPITAL_COMMUNITY): Payer: Medicare Other | Admitting: Anesthesiology

## 2012-04-23 ENCOUNTER — Encounter (HOSPITAL_COMMUNITY): Payer: Self-pay | Admitting: Cardiology

## 2012-04-23 ENCOUNTER — Encounter (HOSPITAL_COMMUNITY): Payer: Self-pay | Admitting: *Deleted

## 2012-04-23 ENCOUNTER — Encounter (HOSPITAL_COMMUNITY)
Admission: RE | Disposition: A | Discharge status: Home / Self Care | Payer: Self-pay | Source: Ambulatory Visit | Attending: Cardiology

## 2012-04-23 DIAGNOSIS — E785 Hyperlipidemia, unspecified: Secondary | ICD-10-CM | POA: Insufficient documentation

## 2012-04-23 DIAGNOSIS — I4891 Unspecified atrial fibrillation: Secondary | ICD-10-CM | POA: Diagnosis not present

## 2012-04-23 DIAGNOSIS — I10 Essential (primary) hypertension: Secondary | ICD-10-CM | POA: Insufficient documentation

## 2012-04-23 DIAGNOSIS — F17200 Nicotine dependence, unspecified, uncomplicated: Secondary | ICD-10-CM | POA: Insufficient documentation

## 2012-04-23 HISTORY — PX: CARDIOVERSION: SHX1299

## 2012-04-23 SURGERY — CARDIOVERSION
Anesthesia: Monitor Anesthesia Care | Wound class: Clean

## 2012-04-23 MED ORDER — HYDROCORTISONE 1 % EX CREA
1.0000 "application " | TOPICAL_CREAM | Freq: Three times a day (TID) | CUTANEOUS | Status: DC | PRN
  Filled 2012-04-23: qty 28

## 2012-04-23 MED ORDER — SODIUM CHLORIDE 0.9 % IV SOLN
INTRAVENOUS | Status: DC | PRN
  Administered 2012-04-23: 14:00:00 via INTRAVENOUS

## 2012-04-23 MED ORDER — SODIUM CHLORIDE 0.9 % IV SOLN
INTRAVENOUS | Status: DC
  Administered 2012-04-23: 500 mL via INTRAVENOUS

## 2012-04-23 MED ORDER — METOPROLOL TARTRATE 50 MG PO TABS
50.0000 mg | ORAL_TABLET | Freq: Once | ORAL | Status: AC
  Administered 2012-04-23: 50 mg via ORAL
  Filled 2012-04-23: qty 1

## 2012-04-23 MED ORDER — PROPOFOL 10 MG/ML IV BOLUS
INTRAVENOUS | Status: DC | PRN
  Administered 2012-04-23: 70 mg via INTRAVENOUS

## 2012-04-23 NOTE — Transfer of Care (Signed)
Immediate Anesthesia Transfer of Care Note  Patient: [name]  Procedure(s) Performed: Procedure(s) (LRB): CARDIOVERSION (N/A)  Patient Location: Endoscopy Unit  Anesthesia Type: General  Level of Consciousness: awake, alert , oriented and patient cooperative  Airway & Oxygen Therapy: Patient Spontanous Breathing and Patient connected to nasal cannula oxygen  Post-op Assessment: Report given to PACU RN and Post -op Vital signs reviewed and stable  Post vital signs: Reviewed and stable  Complications: No apparent anesthesia complications

## 2012-04-23 NOTE — Anesthesia Preprocedure Evaluation (Addendum)
Anesthesia Evaluation  Patient identified by MRN, date of birth, ID band Patient awake    Reviewed: Allergy & Precautions, H&P , NPO status , Patient's Chart, lab work & pertinent test results, reviewed documented beta blocker date and time   History of Anesthesia Complications Negative for: history of anesthetic complications  Airway Mallampati: II TM Distance: <3 FB Neck ROM: Full    Dental No notable dental hx. (+) Dental Advisory Given, Missing and Poor Dentition   Pulmonary Current Smoker,  breath sounds clear to auscultation  Pulmonary exam normal       Cardiovascular hypertension, Pt. on medications and Pt. on home beta blockers + dysrhythmias Atrial Fibrillation + Valvular Problems/Murmurs MR Rhythm:Irregular Rate:Abnormal  Echo 01/2012  Study Conclusions  - Left ventricle: The cavity size was normal. Wall thickness   was normal. Systolic function was normal. The estimated   ejection fraction was in the range of 55% to 65%. Wall   motion was normal; there were no regional wall motion   abnormalities. Features are consistent with a pseudonormal   left ventricular filling pattern, with concomitant   abnormal relaxation and increased filling pressure (grade   2 diastolic dysfunction). - Mitral valve: Mild regurgitation. - Atrial septum: No defect or patent foramen ovale was   identified. Transthoracic echocardiography   Neuro/Psych PSYCHIATRIC DISORDERS Anxiety Depression glaucoma negative neurological ROS  negative psych ROS   GI/Hepatic Neg liver ROS, GERD-  Medicated and Controlled,  Endo/Other  negative endocrine ROS  Renal/GU Renal disease (renal cyst)negative Renal ROS  negative genitourinary   Musculoskeletal negative musculoskeletal ROS (+) Arthritis -,   Abdominal   Peds negative pediatric ROS (+)  Hematology  (+) Blood dyscrasia (coumadin ), ,   Anesthesia Other Findings   Reproductive/Obstetrics negative OB ROS                      Anesthesia Physical Anesthesia Plan  ASA: III  Anesthesia Plan: General   Post-op Pain Management:    Induction: Intravenous  Airway Management Planned: Mask  Additional Equipment:   Intra-op Plan:   Post-operative Plan:   Informed Consent: I have reviewed the patients History and Physical, chart, labs and discussed the procedure including the risks, benefits and alternatives for the proposed anesthesia with the patient or authorized representative who has indicated his/her understanding and acceptance.   Dental advisory given  Plan Discussed with: CRNA and Surgeon  Anesthesia Plan Comments: (Plan routine monitors, GA for cardioversion)       Anesthesia Quick Evaluation

## 2012-04-23 NOTE — Interval H&P Note (Signed)
History and Physical Interval Note:  04/23/2012 1:52 PM  [name]  has presented today for surgery, with the diagnosis of a-fib  The various methods of treatment have been discussed with the patient.  After consideration of risks, benefits and other options for treatment, the patient has consented to  Procedure(s) (LRB): CARDIOVERSION (N/A) as a surgical intervention .  The patient's history has been reviewed, patient examined, no change in status, stable for surgery.  I have reviewed the patient's chart and labs.  Questions were answered to the patient's satisfaction.     Shawnie Pons

## 2012-04-23 NOTE — Preoperative (Addendum)
Beta Blockers   Reason not to administer Beta Blockers:Metoprolol 04/22/12 1800

## 2012-04-23 NOTE — CV Procedure (Signed)
Cardioversion performed in combination with anesthesia.  Propofol used for anesthesia.   Three shocks performed with 75, 100, 120 WS.  Converted on two occasions only to return to NSR.   Strips show tracings with clear NSR, then back into afib.    Alert and oriented after the procedure.  Will arrange follow up in office.

## 2012-04-23 NOTE — Anesthesia Postprocedure Evaluation (Signed)
  Anesthesia Post-op Note  Patient: [name]  Procedure(s) Performed: Procedure(s) (LRB): CARDIOVERSION (N/A)  Patient Location: Endoscopy Unit  Anesthesia Type: General  Level of Consciousness: awake, alert , oriented and patient cooperative  Airway and Oxygen Therapy: Patient Spontanous Breathing  Post-op Pain: none  Post-op Assessment: Post-op Vital signs reviewed, Patient's Cardiovascular Status Stable, Respiratory Function Stable, Patent Airway and No signs of Nausea or vomiting  Post-op Vital Signs: Reviewed and stable  Complications: No apparent anesthesia complications

## 2012-04-23 NOTE — H&P (Signed)
See my recent office note.  Patient is for first attempt at cardioversion.  Risks and benefits have been reviewed with the patient.  Her INR is therapeutic.  She is agreeable .  See note.    Karen Pons, MD  04/23/2012  9:00 AM  Signed    HPI:  The patient notices that she feels tired. However she is otherwise stable. We discussed in great length today the procedure cardioversion which she and I discussed in the past. She would prefer to go ahead with this approach. We discussed the options of continued rate control approach versus an attempt at least 41 cardioversion. She denies any ongoing chest pain. She's not had any unusual bleeding. The procedure itself was explained the patient how it is performed. Risks were also discussed.    Current Outpatient Prescriptions   Medication  Sig  Dispense  Refill   .  aspirin 81 MG tablet  Take 81 mg by mouth daily.           Marland Kitchen  atorvastatin (LIPITOR) 20 MG tablet  TAKE 1 TABLET BY MOUTH ONCE DAILY   30 tablet   5   .  calcium-vitamin D (OSCAL WITH D) 500-200 MG-UNIT per tablet  Take 1 tablet by mouth daily.           .  Cholecalciferol (VITAMIN D3) 1000 UNITS CAPS  Take 1 capsule by mouth daily.           .  hydrocortisone (ANUSOL-HC) 25 MG suppository  Place 1 suppository (25 mg total) rectally daily.   12 suppository   1   .  ibuprofen (ADVIL) 200 MG tablet  as needed.           .  latanoprost (XALATAN) 0.005 % ophthalmic solution  Place 1 drop into the left eye 3 (three) times daily.         Marland Kitchen  levobunolol (BETAGAN) 0.5 % ophthalmic solution  Place 1 drop into both eyes at bedtime.           Marland Kitchen  losartan-hydrochlorothiazide (HYZAAR) 100-12.5 MG per tablet  take 1 tablet by mouth once daily   30 tablet   4   .  metoprolol (LOPRESSOR) 50 MG tablet  Take 1 tablet (50 mg total) by mouth 2 (two) times daily.   60 tablet   5   .  Multiple Vitamin (MULTIVITAMIN) tablet  Take 1 tablet by mouth daily.           .  polyethylene glycol powder (MIRALAX) powder   Take 17 g by mouth as needed.          .  travoprost, benzalkonium, (TRAVATAN) 0.004 % ophthalmic solution  daily.          Marland Kitchen  warfarin (COUMADIN) 2.5 MG tablet  Take 1 tablet (2.5 mg total) by mouth daily.   30 tablet   6       Current Facility-Administered Medications   Medication  Dose  Route  Frequency  Provider  Last Rate  Last Dose   .  0.9 %  sodium chloride infusion   500 mL  Intravenous  Continuous  Hart Carwin, MD            No Known Allergies    Past Medical History   Diagnosis  Date   .  Hypertension     .  Hyperlipidemia     .  Glaucoma     .  Cigarette smoker     .  Dyspepsia     .  Diverticulosis of colon     .  Colonic polyp     .  Family history of colon cancer     .  Hemorrhoids     .  Other specified disorder of rectum and anus     .  Renal cyst     .  DJD (degenerative joint disease)     .  Osteopenia     .  Vitamin d deficiency     .  Restless leg syndrome     .  Anxiety     .  Depression         Past Surgical History   Procedure  Date   .  Total hip arthroplasty  2/05       right by Dr. Shelle Iron   .  Cataract extraction     .  Upper gastrointestinal endoscopy     .  Colonoscopy     .  Retina surgery x 2         Family History   Problem  Relation  Age of Onset   .  Hypertension  Mother     .  Diabetes  Mother     .  Breast cancer  Other         niece   .  Colon cancer  Brother     .  Prostate cancer  Father     .  Diabetes  Brother     .  Diabetes  Sister         History       Social History   .  Marital Status:  Widowed       Spouse Name:  N/A       Number of Children:  0   .  Years of Education:  N/A       Occupational History   .  Retired         Social History Main Topics   .  Smoking status:  Current Everyday Smoker -- 0.3 packs/day   .  Smokeless tobacco:  Never Used     Comment: 6 cig a day   .  Alcohol Use:  No   .  Drug Use:  No   .  Sexually Active:  Not on file       Other Topics  Concern   .  Not on file         Social History Narrative     Exercises 2 times per weekCaffeine use: 1 cup coffee per day      ROS: Please see the HPI.  All other systems reviewed and negative.   PHYSICAL EXAM:   BP 130/90  Pulse 97  Ht 5' 6.5" (1.689 m)  Wt 130 lb 12.8 oz (59.33 kg)  BMI 20.80 kg/m2   General: Thin, elderly woman in no acute distress.    Head:  Normocephalic and atraumatic. Neck: no JVD Lungs: Clear to auscultation and percussion. Heart: irregularly irregular rhythm.  No def murmur.    Pulses: Pulses normal in all 4 extremities. Extremities: No clubbing or cyanosis. No edema. Neurologic: Alert and oriented x 3.   EKG:  Atrial fib with controlled ventricular response.  No acute changes.     ASSESSMENT AND PLAN:     Atrial fibrillation - Karen Pons, MD  04/17/2012 12:23 PM  Signed Has been well controlled. She's been on anticoagulant therapy, and had a therapeutic INR. We discussed  cardioversion, and the patient is agreeable to proceed.  The risks and alternatives, including a strategy of continued rate control, have been discussed with the patient she would like at least one attempt to return to sinus rhythm.     Electronic signature on 04/17/2012

## 2012-04-24 ENCOUNTER — Encounter (HOSPITAL_COMMUNITY): Payer: Self-pay | Admitting: Cardiology

## 2012-04-24 NOTE — Addendum Note (Signed)
Addendum  created 04/24/12 1245 by Adria Dill, CRNA   Modules edited:Charges VN

## 2012-05-07 ENCOUNTER — Ambulatory Visit (INDEPENDENT_AMBULATORY_CARE_PROVIDER_SITE_OTHER): Payer: Medicare Other | Admitting: *Deleted

## 2012-05-07 DIAGNOSIS — Z7901 Long term (current) use of anticoagulants: Secondary | ICD-10-CM

## 2012-05-07 DIAGNOSIS — I4891 Unspecified atrial fibrillation: Secondary | ICD-10-CM | POA: Diagnosis not present

## 2012-05-07 LAB — POCT INR: INR: 2.4

## 2012-05-15 ENCOUNTER — Encounter: Payer: Self-pay | Admitting: Cardiology

## 2012-05-15 ENCOUNTER — Ambulatory Visit (INDEPENDENT_AMBULATORY_CARE_PROVIDER_SITE_OTHER): Payer: Medicare Other | Admitting: Cardiology

## 2012-05-15 VITALS — BP 142/90 | HR 78 | Ht 66.0 in | Wt 129.0 lb

## 2012-05-15 DIAGNOSIS — H409 Unspecified glaucoma: Secondary | ICD-10-CM | POA: Diagnosis not present

## 2012-05-15 DIAGNOSIS — I4891 Unspecified atrial fibrillation: Secondary | ICD-10-CM

## 2012-05-15 DIAGNOSIS — Z961 Presence of intraocular lens: Secondary | ICD-10-CM | POA: Diagnosis not present

## 2012-05-15 DIAGNOSIS — I10 Essential (primary) hypertension: Secondary | ICD-10-CM

## 2012-05-15 DIAGNOSIS — H4011X0 Primary open-angle glaucoma, stage unspecified: Secondary | ICD-10-CM | POA: Diagnosis not present

## 2012-05-15 NOTE — Assessment & Plan Note (Signed)
The patient prefers being treated with rate control and anticoagulation.  She will continue her current regimen and I will see her back in follow up in about two months.

## 2012-05-15 NOTE — Progress Notes (Signed)
HPI:  Patient seen today in a followup visit. She underwent cardioversion which was unsuccessful. She converted, but immediately went back into atrial fibrillation. She has a small scar but overall is doing pretty well. Her preference at this point is to remain in atrial fibrillation with controlled anticoagulation.  Current Outpatient Prescriptions  Medication Sig Dispense Refill  . aspirin 81 MG tablet Take 81 mg by mouth daily.        Marland Kitchen atorvastatin (LIPITOR) 20 MG tablet TAKE 1 TABLET BY MOUTH ONCE DAILY  30 tablet  5  . calcium-vitamin D (OSCAL WITH D) 500-200 MG-UNIT per tablet Take 1 tablet by mouth daily.        . Cholecalciferol (VITAMIN D3) 1000 UNITS CAPS Take 1 capsule by mouth daily.        Marland Kitchen ibuprofen (ADVIL) 200 MG tablet as needed.        . latanoprost (XALATAN) 0.005 % ophthalmic solution Place 1 drop into both eyes at bedtime.       Marland Kitchen levobunolol (BETAGAN) 0.5 % ophthalmic solution Place 1 drop into both eyes 2 (two) times daily.       Marland Kitchen losartan-hydrochlorothiazide (HYZAAR) 100-12.5 MG per tablet take 1 tablet by mouth once daily  30 tablet  4  . metoprolol (LOPRESSOR) 50 MG tablet Take 1 tablet (50 mg total) by mouth 2 (two) times daily.  60 tablet  5  . Multiple Vitamin (MULTIVITAMIN) tablet Take 1 tablet by mouth daily.        . polyethylene glycol powder (MIRALAX) powder Take 17 g by mouth as needed.       . travoprost, benzalkonium, (TRAVATAN) 0.004 % ophthalmic solution Place 1 drop into the left eye 3 (three) times daily.       Marland Kitchen warfarin (COUMADIN) 2.5 MG tablet Take 1 tablet (2.5 mg total) by mouth daily.  30 tablet  6    No Known Allergies  Past Medical History  Diagnosis Date  . Hypertension   . Hyperlipidemia   . Glaucoma   . Cigarette smoker   . Dyspepsia   . Diverticulosis of colon   . Colonic polyp   . Family history of colon cancer   . Hemorrhoids   . Other specified disorder of rectum and anus   . Renal cyst   . DJD (degenerative joint  disease)   . Osteopenia   . Vitamin d deficiency   . Restless leg syndrome   . Anxiety   . Depression   . Dysrhythmia     A-fib    Past Surgical History  Procedure Date  . Total hip arthroplasty 2/05    right by Dr. Shelle Iron  . Cataract extraction   . Upper gastrointestinal endoscopy   . Colonoscopy   . Retina surgery x 2   . Cardioversion 04/23/2012    Procedure: CARDIOVERSION;  Surgeon: Herby Abraham, MD;  Location: [facility] ENDOSCOPY;  Service: Cardiovascular;  Laterality: N/A;    Family History  Problem Relation Age of Onset  . Hypertension Mother   . Diabetes Mother   . Breast cancer Other     niece  . Colon cancer Brother   . Prostate cancer Father   . Diabetes Brother   . Diabetes Sister     History   Social History  . Marital Status: Widowed    Spouse Name: N/A    Number of Children: 0  . Years of Education: N/A   Occupational History  . Retired  Social History Main Topics  . Smoking status: Current Every Day Smoker -- 0.3 packs/day  . Smokeless tobacco: Never Used   Comment: 6 cig a day  . Alcohol Use: No  . Drug Use: No  . Sexually Active: Not on file   Other Topics Concern  . Not on file   Social History Narrative   Exercises 2 times per weekCaffeine use: 1 cup coffee per day    ROS: Please see the HPI.  All other systems reviewed and negative.  PHYSICAL EXAM:  BP 142/90  Pulse 78  Ht 5\' 6"  (1.676 m)  Wt 129 lb (58.514 kg)  BMI 20.82 kg/m2  SpO2 99%  General: Well developed, thin older woman, in no acute distress. Head:  Normocephalic and atraumatic. Neck: no JVD.  Chest:  Minimal erythema around edge of version site.  Lungs: Clear to auscultation and percussion. Heart: irregularly irregular rhythm.   Abdomen:  Normal bowel sounds; soft; non tender; no organomegaly Pulses: Pulses normal in all 4 extremities. Extremities: No clubbing or cyanosis. No edema. Neurologic: Alert and oriented x 3.  EKG: atrial fib with controlled  ventricular response.  No acute changes.    ASSESSMENT AND PLAN:

## 2012-05-15 NOTE — Patient Instructions (Addendum)
Your physician recommends that you schedule a follow-up appointment in: 2 MONTHS  Your physician recommends that you continue on your current medications as directed. Please refer to the Current Medication list given to you today.  Your physician recommends that you return for lab work: BMP (same day as Coumadin Clinic)

## 2012-05-15 NOTE — Assessment & Plan Note (Signed)
Will have her check her K when she comes back to the clinic for coumadin.

## 2012-06-02 ENCOUNTER — Other Ambulatory Visit: Payer: Self-pay | Admitting: Cardiology

## 2012-06-02 NOTE — Telephone Encounter (Signed)
Pease review

## 2012-06-04 ENCOUNTER — Ambulatory Visit (INDEPENDENT_AMBULATORY_CARE_PROVIDER_SITE_OTHER): Payer: Medicare Other | Admitting: Pharmacist

## 2012-06-04 ENCOUNTER — Other Ambulatory Visit (INDEPENDENT_AMBULATORY_CARE_PROVIDER_SITE_OTHER): Payer: Medicare Other

## 2012-06-04 DIAGNOSIS — I4891 Unspecified atrial fibrillation: Secondary | ICD-10-CM

## 2012-06-04 DIAGNOSIS — I10 Essential (primary) hypertension: Secondary | ICD-10-CM

## 2012-06-04 DIAGNOSIS — Z7901 Long term (current) use of anticoagulants: Secondary | ICD-10-CM

## 2012-06-04 LAB — BASIC METABOLIC PANEL
BUN: 17 mg/dL (ref 6–23)
Creatinine, Ser: 0.9 mg/dL (ref 0.4–1.2)
GFR: 81.29 mL/min (ref >60.00)

## 2012-06-04 LAB — POCT INR: INR: 2.5

## 2012-06-11 ENCOUNTER — Other Ambulatory Visit: Payer: Self-pay

## 2012-06-11 DIAGNOSIS — E876 Hypokalemia: Secondary | ICD-10-CM

## 2012-06-11 MED ORDER — POTASSIUM CHLORIDE ER 10 MEQ PO TBCR: 10.0000 meq | EXTENDED_RELEASE_TABLET | Freq: Every day | ORAL | Status: DC

## 2012-06-17 ENCOUNTER — Encounter: Payer: Self-pay | Admitting: *Deleted

## 2012-06-18 ENCOUNTER — Other Ambulatory Visit (INDEPENDENT_AMBULATORY_CARE_PROVIDER_SITE_OTHER): Payer: Medicare Other

## 2012-06-18 ENCOUNTER — Encounter: Payer: Self-pay | Admitting: Pulmonary Disease

## 2012-06-18 ENCOUNTER — Ambulatory Visit (INDEPENDENT_AMBULATORY_CARE_PROVIDER_SITE_OTHER): Payer: Medicare Other | Admitting: Pulmonary Disease

## 2012-06-18 VITALS — BP 140/88 | HR 101 | Temp 97.1°F | Ht 66.0 in | Wt 132.8 lb

## 2012-06-18 DIAGNOSIS — K5730 Diverticulosis of large intestine without perforation or abscess without bleeding: Secondary | ICD-10-CM

## 2012-06-18 DIAGNOSIS — M1990 Unspecified osteoarthritis, unspecified site: Secondary | ICD-10-CM

## 2012-06-18 DIAGNOSIS — K3189 Other diseases of stomach and duodenum: Secondary | ICD-10-CM

## 2012-06-18 DIAGNOSIS — I4891 Unspecified atrial fibrillation: Secondary | ICD-10-CM

## 2012-06-18 DIAGNOSIS — E7800 Pure hypercholesterolemia, unspecified: Secondary | ICD-10-CM

## 2012-06-18 DIAGNOSIS — F17200 Nicotine dependence, unspecified, uncomplicated: Secondary | ICD-10-CM

## 2012-06-18 DIAGNOSIS — I10 Essential (primary) hypertension: Secondary | ICD-10-CM

## 2012-06-18 DIAGNOSIS — D126 Benign neoplasm of colon, unspecified: Secondary | ICD-10-CM

## 2012-06-18 DIAGNOSIS — E876 Hypokalemia: Secondary | ICD-10-CM

## 2012-06-18 DIAGNOSIS — F411 Generalized anxiety disorder: Secondary | ICD-10-CM

## 2012-06-18 DIAGNOSIS — G2581 Restless legs syndrome: Secondary | ICD-10-CM

## 2012-06-18 DIAGNOSIS — M899 Disorder of bone, unspecified: Secondary | ICD-10-CM

## 2012-06-18 LAB — BASIC METABOLIC PANEL
Calcium: 9 mg/dL (ref 8.4–10.5)
GFR: 81.29 mL/min (ref >60.00)
Sodium: 139 mEq/L (ref 135–145)

## 2012-06-18 NOTE — Patient Instructions (Addendum)
Today we updated your med list in our EPIC system...    Continue your current medications the same...  We discussed possibly increasing the Metoprolol to 1.5 tabs twice daily, but you wanted to keep it the same for now & this is OK- monitor your pulse rate & BP at home...  We gave you the 2013 Flu vaccine today...  We will call you w/ the result of today's blood work...  Call for any questions...  Let's plan a follow up visit in 6 months, sooner if needed for problems.Marland KitchenMarland Kitchen

## 2012-06-18 NOTE — Progress Notes (Signed)
Subjective:    Patient ID: [name], female    DOB: 04-24-1934, 78 y.o.   MRN: [id number]  HPI 78 y/o BF here for a follow up visit... she has HBP, Chol, DJD, and mult med problems as noted below...   ~  December 21, 2010:  22mo ROV & she reports 2 cataract surgeries by DrShapiro followed by ?retinal surg by DrRankin (we don't have records);  Her chief complaint is hemorrhoids which she says are no better after suppos Rx & AnusolHC cream Rx> she wonders about going to see [facility] having seen advert on TV for his hem rx, but we decided to f/u w/ DrDBrodie here;  Still smoking but down to <1/2ppd she says, classic chr bronchitic AM cough/ phlegm & we discussed Mucinex prn (she doesn't want smoking cessation help or breathing meds "my breathing is fine");  BP controlled on meds- denies CP, palpit, dizzy, edema, etc;  Due for fasting blood work & she will ret for this> all normal...  ~  June 22, 2011:  22mo ROV & she has had a good interval, no new complaints or concerns; she brings in a Living Will for [place] to scan into the record; ok flu shot...    Cig smoker> still smokes ~1/2ppd & does not want smoking cessation counseling or help; denies cough, phlegm, hemoptysis, CP, SOB, etc...    HBP> on Metoprolol50, Hyzaar100; BP= 148/82 today & even better at home- reminded to reduce sodium, take meds daily; she denies CP, palpit, SOB, edema, etc.    CHOL> on Lipitor20; FLP looks good & all parameters are at goal...    GI> on Prilosec OTC prn, Miralax, Metamucil, Analpram Cream; she's had considerable difficulty w/ hemorrhoids but now says "it's not my hem's at all, it's my glands" w/ evals from DrDBrodie, DrToth ([facility]), and DrFuller at [facility]... Follow up colonoscopy 5/12 by DrBrodie showed severe diverticulosis & rectal prolapse...    DJD> on Ca++ MVI, VitD, Advil; she had right THR in 2005...    Anxiety> she has been on Zoloft & Rebeca Allegra in the past...  ~  December 17, 2011:  22mo ROV & [name] is concerned about  her vision> continued f/u DrShapiro & DrRankin w/ more surg that she doesn't understand & we don't have any notes to review;  Still smoking 1/2ppd but denies much cough, sputum, no hemoptysis, SOB, etc> she is due for f/u CXR= stable scarring at the bases, DJD in spine, NAD;  BP controlled on her & Chol ok on her Lip20> see prob list below>> CXR 4/13 showed normal heart size, clear lungs w/ mild scarring at the bases, DJD in spine, NAD... LABS 4/13:  FLP- at goals on Lip20;  Chems- wnl;  CBC- wnl;  TSH=0.92;  VitD=31  ~  June 18, 2012:  22mo ROV & [name] developed AFib in the interval, seen by DrStuckey, tried Cardioversion but this was unsuccessful & pt prefers to remain in AFib w/ rate control on Metoprolol & Coumadin anticoagulation...     Cig smoker> still smokes ~1/2ppd & does not want smoking cessation counseling or help; denies cough, phlegm, hemoptysis, CP, SOB, etc...    HBP> on Metoprolol50Bid, Hyzaar100/12.5, & K10; BP= 138/90 today & even better at home- reminded to reduce sodium, take meds daily; BP sl elev today but she does NOT want to inc meds!    AFib> on above + Coumadin via the CC; unsuccessful cardioversion attempt 8/13; good rate control, she is stable & denies  CP, palpit, SOB, edema...    CHOL> on Lipitor20; FLP looks good & all parameters are at goal, she is not fasting today...    GI> on Prilosec OTC prn, Miralax, Metamucil, Analpram Cream; she's had considerable difficulty w/ hemorrhoids but now says "it's not my hem's at all, it's my glands" w/ evals from DrDBrodie, DrToth ([facility]), and DrFuller at [facility]... Follow up colonoscopy 5/12 by DrBrodie showed severe diverticulosis & rectal prolapse...    DJD, osteopenia> on Ca++ MVI, VitD, Advil; she had right THR in 2005...    Anxiety> she has been on Zoloft & Alpraz in the past... We reviewed prob list, meds, xrays and labs> see below for updates >> OK Flu shot today... LABS 8/13:  Chems- wnl;  CBC- wnl;  Protimes per Coumadin  Clinic... EKG 9/13 showed AFib, rate 78; NSSTTWA...          Problem List:      FATIGUE (ICD-780.79) - she rests OK, wakes tired most days, no daytime hypersomnolence...  GLAUCOMA (ICD-365.9) - on eye drops per ophthalmology;  Had bilat cat surg by DrShapiro, then required retinal surg by DrRankin (we don't have records) & she describes on-going prob w/ her vision that has her concerned...  CIGARETTE SMOKER (ICD-305.1) - still smokes ~1/2ppd & must quit, she knows the risks & not interested in smoking cessation help; she is reminded at each & every OV how important it is to quit smoking!!! She refuses smoking cessation help... ~  CXR 10/07 showed sl hyperinflation, mild incr Cor... ~  CXR in ER 10/09 showed COPD, bibasilar scarring, NAD.Marland Kitchen. ~  CXR 10/10 showed similar findings- scarring, NAD.Marland Kitchen. ~  CXR 10/11 showed COPD/ Emphysema, bibasilar scarring, DJD sp, NAD. ~  CXR 4/13 showed normal heart size, clear lungs w/ mild scarring at the bases, DJD in spine, NAD...  HYPERTENSION (ICD-401.9) - controlled on ASA 81mg /d, METOPROLOL 50mg /d & LOSARTAN/HCT 100-12.5 daily..  ~  10/11:  BP= 134/76 & she tells me that she sometimes takes these meds Qod and that she feels better on this schedule & wishes to continue this way... ~  4/12:  BP= 140/70 & she denies HA, CP, palpit, SOB, edema, etc... ~  10/12:  BP= 148/82 & she remains asymptomatic... ~  4/13:  BP= 140/82 & she denies HAs, CP, palpit, dizzy, SOB, edema... ~  10/13:  BP= 138/90 & she denies symptoms and does not want to incr meds at this time, she has f/u DrStuckey soon...  HYPERCHOLESTEROLEMIA (ICD-272.0) - on LIPITOR 20mg /d... ~  FLP 4/09 showed TChol 181, TG 73, HDL 54, LDL 112 ~  FLP 4/10 showed TChol 168, TG 63, HDL 53, LDL 102 ~  FLP 4/11 on Lip20 showed TChol 169, TG 64, HDL 57, LDL 100 ~  FLP 4/12 on Lip20 showed TChol 163, TG 52, HDL 70, LDL 83... Continue same. ~  FLP 4/13 on Lip20 showed TChol 170, TG 46, HDL 79, LDL  82  DYSPEPSIA (ICD-536.8) - uses PRILOSEC OTC as needed...  DIVERTICULOSIS OF COLON (ICD-562.10) - she alternates MIRALAX & METAMUCIL Qod... COLONIC POLYPS (ICD-211.3)  Family Hx of COLON CANCER (ICD-153.9) HEMORRHOIDS (ICD-455.6) - "Hemorrhoids are my biggest problem" > she will f/u GI- DrDBrodie... OTHER SPECIFIED DISORDER OF RECTUM AND ANUS (ICD-569.49) -  - eval by DrToth & referred to [facility] DrFuller for painful defecation... he recommended muscle retraining but initially her insurance wouldn't cover this, later they did cover & she had the "muscle therapy" now doing home therapy... ~  Colonoscopy by DrBrodie 6/07 showed divertics, hems... ~  Colonoscopy 5/12 showed severe diverticulosis & rectal prolapse...  RENAL CYST (ICD-593.2) - Abd Sonar 11/09 showed sm bilat renal cysts...  DEGENERATIVE JOINT DISEASE (ICD-715.90) - s/p right THR 3/05 by DrBeane... she also has a known lumbar spondylosis w/ right leg radiculopathy... she states that exercise is helping... ~  10/10: c/o knee pain- she declines Rheum/ Ortho referral... continue OTC meds (Advil) Prn...  OSTEOPENIA (ICD-733.90) & VITAMIN D DEFICIENCY (ICD-268.9) -  ~  labs 4/10 showed Vit D level = 23 & started on Vit D 1000 u OTC daily... ~  BMD 5/11 showed TScores -1.2 in Spine, and -1.6 in [facility]... on Calcium, MVI, Vit D. ~  labs 5/11 showed Vit D level = 41... continue 1000 u daily. ~  Labs 4/13 showed Vit D level = 31  Hx of RESTLESS LEGS SYNDROME (ICD-333.94) - she tried Requip 1mg  Qhs in the past> now off...  ANXIETY (ICD-300.00) - she states that she stopped the Alprazolam (prev 0.5mg  Prn)... ~  10/09 states depressed- 2 sisters died 01-25-23- one w/ ca pancreas, other w/ DM/ ASPVD.Marland Kitchen. tried Zofoft transiently then stopped.  HEALTH MAINTENANCE:  she sees DrMezer et al for GYN, neg mammogram 3/12... she gets the yearly seasonal Flu vaccines, and had the PNEUMOVAX in 2005 at age 54...   Past Surgical History  Procedure Date  .  Total hip arthroplasty 2/05    right by Dr. Shelle Iron  . Cataract extraction   . Upper gastrointestinal endoscopy   . Colonoscopy   . Retina surgery x 2   . Cardioversion 04/23/2012    Procedure: CARDIOVERSION;  Surgeon: Herby Abraham, MD;  Location: [facility] ENDOSCOPY;  Service: Cardiovascular;  Laterality: N/A;    Outpatient Encounter Prescriptions as of 06/18/2012  Medication Sig Dispense Refill  . atorvastatin (LIPITOR) 20 MG tablet TAKE 1 TABLET BY MOUTH ONCE DAILY  30 tablet  5  . calcium-vitamin D (OSCAL WITH D) 500-200 MG-UNIT per tablet Take 1 tablet by mouth daily.        . Cholecalciferol (VITAMIN D3) 1000 UNITS CAPS Take 1 capsule by mouth daily.        Marland Kitchen ibuprofen (ADVIL) 200 MG tablet as needed.        . latanoprost (XALATAN) 0.005 % ophthalmic solution Place 1 drop into both eyes at bedtime.       Marland Kitchen levobunolol (BETAGAN) 0.5 % ophthalmic solution Place 1 drop into both eyes 2 (two) times daily.       Marland Kitchen losartan-hydrochlorothiazide (HYZAAR) 100-12.5 MG per tablet take 1 tablet by mouth once daily  30 tablet  4  . metoprolol (LOPRESSOR) 50 MG tablet Take 1 tablet (50 mg total) by mouth 2 (two) times daily.  60 tablet  5  . Multiple Vitamin (MULTIVITAMIN) tablet Take 1 tablet by mouth daily.        . polyethylene glycol powder (MIRALAX) powder Take 17 g by mouth as needed.       . potassium chloride (K-DUR) 10 MEQ tablet Take 1 tablet (10 mEq total) by mouth daily.  30 tablet  6  . travoprost, benzalkonium, (TRAVATAN) 0.004 % ophthalmic solution Place 1 drop into the left eye 3 (three) times daily.       Marland Kitchen warfarin (COUMADIN) 2.5 MG tablet take by mouth as directed BY COUMADIN CLINIC  90 tablet  3    No Known Allergies   Current Medications, Allergies, Past Medical History, Past Surgical History, Family  History, and Social History were reviewed in [employer] record.    Review of Systems       See HPI - all other systems neg except as noted... The  patient complains of dyspnea on exertion.  The patient denies anorexia, fever, weight loss, weight gain, vision loss, decreased hearing, hoarseness, chest pain, syncope, peripheral edema, prolonged cough, headaches, hemoptysis, abdominal pain, melena, hematochezia, severe indigestion/heartburn, hematuria, incontinence, muscle weakness, suspicious skin lesions, transient blindness, difficulty walking, depression, unusual weight change, abnormal bleeding, enlarged lymph nodes, and angioedema.     Objective:   Physical Exam     WD, WN, 78 y/o BF in NAD... GENERAL:  Alert & oriented; pleasant & cooperative. HEENT:  [place]/AT, EOM-wnl, PERRLA, EACs-clear, TMs-wnl, NOSE-clear, THROAT-clear & wnl. NECK:  Supple w/ fairROM; no JVD; normal carotid impulses w/o bruits; no thyromegaly or nodules palpated; no lymphadenopathy. CHEST:  Clear to P & A; without wheezes/ rales/ or rhonchi. HEART:  Regular Rhythm;  gr 1/6 SEM without rubs or gallops heard... ABDOMEN:  Soft & nontender; normal bowel sounds; no organomegaly or masses detected. EXT: without deformities, mod arthritic changes; no varicose veins/ +venous insuffic/ tr edema. NEURO:  CN's intact;  no focal neuro deficits... DERM:  No lesions noted; no rash etc...  RADIOLOGY DATA:  Reviewed in the EPIC EMR & discussed w/ the patient...  LABORATORY DATA:  Reviewed in the EPIC EMR & discussed w/ the patient...   Assessment & Plan:            Visual problems>  Followed by DrShapiro & DrRankin; encouraged to ask questions so she can better understand what is going on...  SMOKER>  We again reviewed the importance of smoking cessation, she declines smoking cessation help...  HBP>  Controlled on BBlocker, ARB, Diuretic, Potassium;  Continue same + no salt etc...  AFIB>  New prob 6/13, she is followed by DrStuckey & had cardioversion attempt 8/13, unsuccessful & she has opted for rate control & Coumadin...  CHOL>  She remains stable on the Lip20;   Continue same + diet/ exercise...  DYSPEPSIA>  Continue Prilosec OTC Prn...  HEM's>  She has followed up w/ DrDBrodie & had colonoscopy 5/12...  DJD/ Osteopenia>  As noted right THR 2005, known LBP/ spondylosis; c/o knee pain on & off & uses OTC analgesics prn...  Anxiety> she declines anxiolytic Rx...   Patient's Medications  New Prescriptions   No medications on file  Previous Medications   ATORVASTATIN (LIPITOR) 20 MG TABLET    TAKE 1 TABLET BY MOUTH ONCE DAILY   CALCIUM-VITAMIN D (OSCAL WITH D) 500-200 MG-UNIT PER TABLET    Take 1 tablet by mouth daily.     CHOLECALCIFEROL (VITAMIN D3) 1000 UNITS CAPS    Take 1 capsule by mouth daily.     IBUPROFEN (ADVIL) 200 MG TABLET    as needed.     LATANOPROST (XALATAN) 0.005 % OPHTHALMIC SOLUTION    Place 1 drop into both eyes at bedtime.    LEVOBUNOLOL (BETAGAN) 0.5 % OPHTHALMIC SOLUTION    Place 1 drop into both eyes 2 (two) times daily.    LOSARTAN-HYDROCHLOROTHIAZIDE (HYZAAR) 100-12.5 MG PER TABLET    take 1 tablet by mouth once daily   METOPROLOL (LOPRESSOR) 50 MG TABLET    Take 1 tablet (50 mg total) by mouth 2 (two) times daily.   MULTIPLE VITAMIN (MULTIVITAMIN) TABLET    Take 1 tablet by mouth daily.     POLYETHYLENE GLYCOL POWDER (  MIRALAX) POWDER    Take 17 g by mouth as needed.    POTASSIUM CHLORIDE (K-DUR) 10 MEQ TABLET    Take 1 tablet (10 mEq total) by mouth daily.   TRAVOPROST, BENZALKONIUM, (TRAVATAN) 0.004 % OPHTHALMIC SOLUTION    Place 1 drop into the left eye 3 (three) times daily.    WARFARIN (COUMADIN) 2.5 MG TABLET    take by mouth as directed BY COUMADIN CLINIC  Modified Medications   No medications on file  Discontinued Medications   No medications on file

## 2012-06-22 ENCOUNTER — Other Ambulatory Visit: Payer: Self-pay | Admitting: Pulmonary Disease

## 2012-06-22 ENCOUNTER — Encounter: Payer: Self-pay | Admitting: Pulmonary Disease

## 2012-07-14 DIAGNOSIS — H4011X0 Primary open-angle glaucoma, stage unspecified: Secondary | ICD-10-CM | POA: Diagnosis not present

## 2012-07-14 DIAGNOSIS — H278 Other specified disorders of lens: Secondary | ICD-10-CM | POA: Diagnosis not present

## 2012-07-15 ENCOUNTER — Ambulatory Visit (INDEPENDENT_AMBULATORY_CARE_PROVIDER_SITE_OTHER): Payer: Medicare Other | Admitting: *Deleted

## 2012-07-15 ENCOUNTER — Ambulatory Visit (INDEPENDENT_AMBULATORY_CARE_PROVIDER_SITE_OTHER): Payer: Medicare Other | Admitting: Cardiology

## 2012-07-15 ENCOUNTER — Encounter: Payer: Self-pay | Admitting: Cardiology

## 2012-07-15 VITALS — BP 148/86 | HR 95 | Ht 66.5 in | Wt 133.0 lb

## 2012-07-15 DIAGNOSIS — I4891 Unspecified atrial fibrillation: Secondary | ICD-10-CM

## 2012-07-15 DIAGNOSIS — I10 Essential (primary) hypertension: Secondary | ICD-10-CM | POA: Diagnosis not present

## 2012-07-15 DIAGNOSIS — Z7901 Long term (current) use of anticoagulants: Secondary | ICD-10-CM | POA: Diagnosis not present

## 2012-07-15 LAB — POCT INR: INR: 2.5

## 2012-07-15 NOTE — Patient Instructions (Addendum)
Your physician recommends that you schedule a follow-up appointment in: 2 MONTHS  Your physician recommends that you continue on your current medications as directed. Please refer to the Current Medication list given to you today.   

## 2012-07-15 NOTE — Assessment & Plan Note (Signed)
Stable at the present time.  She will remain on rate control with anticoagulation.

## 2012-07-15 NOTE — Progress Notes (Signed)
HPI:   Patient is in for followup visit. In general she is doing quite well. Her heart rhythm is reasonably controlled. She denies progressive or if problems with shortness of breath. She denies any chest pain. She is doing well with her anticoagulation. We talked about the bowel agents, and also about the possibility of being in the [place] registry today.         Current Outpatient Prescriptions  Medication Sig Dispense Refill  . atorvastatin (LIPITOR) 20 MG tablet TAKE 1 TABLET BY MOUTH ONCE DAILY  30 tablet  5  . calcium-vitamin D (OSCAL WITH D) 500-200 MG-UNIT per tablet Take 1 tablet by mouth daily.        . Cholecalciferol (VITAMIN D3) 1000 UNITS CAPS Take 1 capsule by mouth daily.        Marland Kitchen ibuprofen (ADVIL) 200 MG tablet as needed.        . latanoprost (XALATAN) 0.005 % ophthalmic solution Place 1 drop into both eyes at bedtime.       Marland Kitchen levobunolol (BETAGAN) 0.5 % ophthalmic solution Place 1 drop into both eyes 2 (two) times daily.       Marland Kitchen losartan-hydrochlorothiazide (HYZAAR) 100-12.5 MG per tablet take 1 tablet by mouth once daily  30 tablet  4  . metoprolol (LOPRESSOR) 50 MG tablet Take 1 tablet (50 mg total) by mouth 2 (two) times daily.  60 tablet  5  . Multiple Vitamin (MULTIVITAMIN) tablet Take 1 tablet by mouth daily.        . polyethylene glycol powder (MIRALAX) powder Take 17 g by mouth as needed.       . potassium chloride (K-DUR) 10 MEQ tablet Take 1 tablet (10 mEq total) by mouth daily.  30 tablet  6  . travoprost, benzalkonium, (TRAVATAN) 0.004 % ophthalmic solution Place 1 drop into the left eye 3 (three) times daily.       Marland Kitchen warfarin (COUMADIN) 2.5 MG tablet take by mouth as directed BY COUMADIN CLINIC  90 tablet  3    No Known Allergies  Past Medical History  Diagnosis Date  . Hypertension   . Hyperlipidemia   . Glaucoma(365)   . Cigarette smoker   . Dyspepsia   . Diverticulosis of colon   . Colonic polyp   . Family history of colon cancer   .  Hemorrhoids   . Other specified disorder of rectum and anus   . Renal cyst   . DJD (degenerative joint disease)   . Osteopenia   . Vitamin D deficiency   . Restless leg syndrome   . Anxiety   . Depression   . Dysrhythmia     A-fib    Past Surgical History  Procedure Date  . Total hip arthroplasty 2/05    right by Dr. Shelle Iron  . Cataract extraction   . Upper gastrointestinal endoscopy   . Colonoscopy   . Retina surgery x 2 2013  . Cardioversion 04/23/2012    Procedure: CARDIOVERSION;  Surgeon: Herby Abraham, MD;  Location: [facility] ENDOSCOPY;  Service: Cardiovascular;  Laterality: N/A;    Family History  Problem Relation Age of Onset  . Hypertension Mother   . Diabetes Mother   . Breast cancer Other     niece  . Colon cancer Brother   . Prostate cancer Father   . Diabetes Brother   . Diabetes Sister     History   Social History  . Marital Status: Widowed    Spouse Name:  N/A    Number of Children: 0  . Years of Education: N/A   Occupational History  . Retired    Social History Main Topics  . Smoking status: Light Tobacco Smoker -- 0.3 packs/day  . Smokeless tobacco: Never Used     Comment: 6 cig a day  . Alcohol Use: No  . Drug Use: No  . Sexually Active: Not on file   Other Topics Concern  . Not on file   Social History Narrative   Exercises 2 times per weekCaffeine use: 1 cup coffee per day    ROS: Please see the HPI.  All other systems reviewed and negative.  PHYSICAL EXAM:  BP 148/86  Pulse 95  Ht 5' 6.5" (1.689 m)  Wt 133 lb (60.328 kg)  BMI 21.15 kg/m2  SpO2 99%  General: Well developed, well nourished, in no acute distress. Head:  Normocephalic and atraumatic. Neck: no JVD Lungs: Clear to auscultation and percussion. Heart   irregularly, irregular pulse without murmur; Pulses: Pulses normal in all 4 extremities. Extremities: No clubbing or cyanosis. No edema. Neurologic: Alert and oriented x 3.  EKG:  Atrial fib with controlled  ventricular response.  Nonspecific T abnormality.     ASSESSMENT AND PLAN:

## 2012-07-15 NOTE — Assessment & Plan Note (Signed)
Will recheck her K to assess for hypokalemia.

## 2012-08-11 DIAGNOSIS — H409 Unspecified glaucoma: Secondary | ICD-10-CM | POA: Diagnosis not present

## 2012-08-11 DIAGNOSIS — H4011X0 Primary open-angle glaucoma, stage unspecified: Secondary | ICD-10-CM | POA: Diagnosis not present

## 2012-08-26 ENCOUNTER — Ambulatory Visit (INDEPENDENT_AMBULATORY_CARE_PROVIDER_SITE_OTHER): Payer: Medicare Other | Admitting: *Deleted

## 2012-08-26 DIAGNOSIS — Z7901 Long term (current) use of anticoagulants: Secondary | ICD-10-CM | POA: Diagnosis not present

## 2012-08-26 DIAGNOSIS — I4891 Unspecified atrial fibrillation: Secondary | ICD-10-CM | POA: Diagnosis not present

## 2012-08-26 LAB — POCT INR: INR: 4.2

## 2012-08-28 ENCOUNTER — Telehealth: Payer: Self-pay | Admitting: Cardiology

## 2012-08-28 DIAGNOSIS — I4891 Unspecified atrial fibrillation: Secondary | ICD-10-CM

## 2012-08-28 MED ORDER — METOPROLOL TARTRATE 50 MG PO TABS: 50.0000 mg | ORAL_TABLET | Freq: Two times a day (BID) | ORAL | Status: DC

## 2012-08-28 NOTE — Telephone Encounter (Signed)
New Problem:    Patient called in needing a refill of her metoprolol (LOPRESSOR) 50 MG tablet.

## 2012-09-05 ENCOUNTER — Ambulatory Visit (INDEPENDENT_AMBULATORY_CARE_PROVIDER_SITE_OTHER): Payer: Medicare Other

## 2012-09-05 DIAGNOSIS — Z7901 Long term (current) use of anticoagulants: Secondary | ICD-10-CM

## 2012-09-05 DIAGNOSIS — I4891 Unspecified atrial fibrillation: Secondary | ICD-10-CM

## 2012-09-15 ENCOUNTER — Emergency Department (HOSPITAL_COMMUNITY): Payer: Medicare Other

## 2012-09-15 ENCOUNTER — Encounter (HOSPITAL_COMMUNITY): Payer: Self-pay | Admitting: Emergency Medicine

## 2012-09-15 ENCOUNTER — Emergency Department (HOSPITAL_COMMUNITY)
Admission: EM | Admit: 2012-09-15 | Discharge: 2012-09-15 | Disposition: A | Discharge status: Home / Self Care | Payer: Medicare Other | Attending: Emergency Medicine | Admitting: Emergency Medicine

## 2012-09-15 DIAGNOSIS — Z8719 Personal history of other diseases of the digestive system: Secondary | ICD-10-CM | POA: Diagnosis not present

## 2012-09-15 DIAGNOSIS — E785 Hyperlipidemia, unspecified: Secondary | ICD-10-CM | POA: Diagnosis not present

## 2012-09-15 DIAGNOSIS — Z860100 Personal history of colon polyps, unspecified: Secondary | ICD-10-CM | POA: Insufficient documentation

## 2012-09-15 DIAGNOSIS — Z8679 Personal history of other diseases of the circulatory system: Secondary | ICD-10-CM | POA: Insufficient documentation

## 2012-09-15 DIAGNOSIS — I4891 Unspecified atrial fibrillation: Secondary | ICD-10-CM | POA: Insufficient documentation

## 2012-09-15 DIAGNOSIS — R079 Chest pain, unspecified: Secondary | ICD-10-CM | POA: Diagnosis not present

## 2012-09-15 DIAGNOSIS — R0602 Shortness of breath: Secondary | ICD-10-CM | POA: Insufficient documentation

## 2012-09-15 DIAGNOSIS — F17200 Nicotine dependence, unspecified, uncomplicated: Secondary | ICD-10-CM | POA: Diagnosis not present

## 2012-09-15 DIAGNOSIS — F329 Major depressive disorder, single episode, unspecified: Secondary | ICD-10-CM | POA: Insufficient documentation

## 2012-09-15 DIAGNOSIS — M1990 Unspecified osteoarthritis, unspecified site: Secondary | ICD-10-CM | POA: Diagnosis not present

## 2012-09-15 DIAGNOSIS — Z7901 Long term (current) use of anticoagulants: Secondary | ICD-10-CM | POA: Insufficient documentation

## 2012-09-15 DIAGNOSIS — R9431 Abnormal electrocardiogram [ECG] [EKG]: Secondary | ICD-10-CM | POA: Diagnosis not present

## 2012-09-15 DIAGNOSIS — R4182 Altered mental status, unspecified: Secondary | ICD-10-CM | POA: Diagnosis not present

## 2012-09-15 DIAGNOSIS — R42 Dizziness and giddiness: Secondary | ICD-10-CM | POA: Diagnosis not present

## 2012-09-15 DIAGNOSIS — Z87448 Personal history of other diseases of urinary system: Secondary | ICD-10-CM | POA: Insufficient documentation

## 2012-09-15 DIAGNOSIS — N390 Urinary tract infection, site not specified: Secondary | ICD-10-CM | POA: Insufficient documentation

## 2012-09-15 DIAGNOSIS — I10 Essential (primary) hypertension: Secondary | ICD-10-CM | POA: Diagnosis not present

## 2012-09-15 DIAGNOSIS — H409 Unspecified glaucoma: Secondary | ICD-10-CM | POA: Insufficient documentation

## 2012-09-15 DIAGNOSIS — R112 Nausea with vomiting, unspecified: Secondary | ICD-10-CM | POA: Diagnosis not present

## 2012-09-15 DIAGNOSIS — F3289 Other specified depressive episodes: Secondary | ICD-10-CM | POA: Insufficient documentation

## 2012-09-15 DIAGNOSIS — Z8669 Personal history of other diseases of the nervous system and sense organs: Secondary | ICD-10-CM | POA: Diagnosis not present

## 2012-09-15 DIAGNOSIS — M899 Disorder of bone, unspecified: Secondary | ICD-10-CM | POA: Insufficient documentation

## 2012-09-15 DIAGNOSIS — R110 Nausea: Secondary | ICD-10-CM | POA: Diagnosis not present

## 2012-09-15 LAB — COMPREHENSIVE METABOLIC PANEL
Albumin: 3.5 g/dL (ref 3.5–5.2)
Alkaline Phosphatase: 65 U/L (ref 39–117)
BUN: 14 mg/dL (ref 6–23)
Potassium: 4.1 mEq/L (ref 3.5–5.1)
Total Protein: 6.3 g/dL (ref 6.0–8.3)

## 2012-09-15 LAB — CBC WITH DIFFERENTIAL/PLATELET
Basophils Relative: 1 % (ref 0–1)
Eosinophils Absolute: 0.1 10*3/uL (ref 0.0–0.7)
MCH: 30.7 pg (ref 26.0–34.0)
MCHC: 33.8 g/dL (ref 30.0–36.0)
Monocytes Relative: 8 % (ref 3–12)
Neutrophils Relative %: 66 % (ref 43–77)
Platelets: 156 10*3/uL (ref 150–400)
RDW: 13.7 % (ref 11.5–15.5)

## 2012-09-15 LAB — TROPONIN I: Troponin I: <0.3 ng/mL (ref <0.30)

## 2012-09-15 LAB — URINALYSIS, ROUTINE W REFLEX MICROSCOPIC
Leukocytes, UA: NEGATIVE
Nitrite: POSITIVE — AB
Specific Gravity, Urine: 1.012 (ref 1.005–1.030)
Urobilinogen, UA: 0.2 mg/dL (ref 0.0–1.0)

## 2012-09-15 LAB — PROTIME-INR
INR: 2.38 — ABNORMAL HIGH (ref 0.00–1.49)
Prothrombin Time: 24.9 seconds — ABNORMAL HIGH (ref 11.6–15.2)

## 2012-09-15 MED ORDER — CEPHALEXIN 500 MG PO CAPS: 500.0000 mg | ORAL_CAPSULE | Freq: Two times a day (BID) | ORAL | Status: DC

## 2012-09-15 MED ORDER — CEPHALEXIN 500 MG PO CAPS
500.0000 mg | ORAL_CAPSULE | Freq: Once | ORAL | Status: AC
Start: 2012-09-15 — End: 2012-09-15
  Administered 2012-09-15: 500 mg via ORAL
  Filled 2012-09-15: qty 1

## 2012-09-15 NOTE — ED Notes (Signed)
Patient transported to CT 

## 2012-09-15 NOTE — ED Notes (Signed)
Bed:WA05<BR> Expected date:<BR> Expected time:<BR> Means of arrival:<BR> Comments:<BR> EMS

## 2012-09-15 NOTE — ED Provider Notes (Signed)
History     CSN: [id number]  Arrival date & time 09/15/12  1028   First MD Initiated Contact with Patient 09/15/12 1054      Chief Complaint  Patient presents with  . Shortness of Breath  . Nausea    (Consider location/radiation/quality/duration/timing/severity/associated sxs/prior treatment) HPI  The patient presents with concerns of nausea, lightheadedness, dizziness.  She denies focal pain anywhere.  She states that symptoms became prominent soon after awakening today, approximately 5 hours ago.  Since onset there is been no clear alleviating or exacerbating factors, including resting, position, taking all her medication as directed. No vomiting, no diarrhea, no fever, no chills, no dyspnea, some of this is counter to the initial triage.  Past Medical History  Diagnosis Date  . Hypertension   . Hyperlipidemia   . Glaucoma(365)   . Cigarette smoker   . Dyspepsia   . Diverticulosis of colon   . Colonic polyp   . Family history of colon cancer   . Hemorrhoids   . Other specified disorder of rectum and anus   . Renal cyst   . DJD (degenerative joint disease)   . Osteopenia   . Vitamin D deficiency   . Restless leg syndrome   . Anxiety   . Depression   . Dysrhythmia     A-fib    Past Surgical History  Procedure Date  . Total hip arthroplasty 2/05    right by Dr. Shelle Iron  . Cataract extraction   . Upper gastrointestinal endoscopy   . Colonoscopy   . Retina surgery x 2 2013  . Cardioversion 04/23/2012    Procedure: CARDIOVERSION;  Surgeon: Herby Abraham, MD;  Location: [facility] ENDOSCOPY;  Service: Cardiovascular;  Laterality: N/A;    Family History  Problem Relation Age of Onset  . Hypertension Mother   . Diabetes Mother   . Breast cancer Other     niece  . Colon cancer Brother   . Prostate cancer Father   . Diabetes Brother   . Diabetes Sister     History  Substance Use Topics  . Smoking status: Light Tobacco Smoker -- 0.3 packs/day  . Smokeless tobacco:  Never Used     Comment: 6 cig a day  . Alcohol Use: No    OB History    Grav Para Term Preterm Abortions TAB SAB Ect Mult Living                  Review of Systems  Constitutional:       Per HPI, otherwise negative  HENT:       Per HPI, otherwise negative  Eyes: Negative.   Respiratory:       Per HPI, otherwise negative  Cardiovascular:       Per HPI, otherwise negative  Gastrointestinal: Negative for vomiting.  Genitourinary: Negative.   Musculoskeletal:       Per HPI, otherwise negative  Skin: Negative.   Neurological: Negative for syncope.    Allergies  Review of patient's allergies indicates no known allergies.  Home Medications   Current Outpatient Rx  Name  Route  Sig  Dispense  Refill  . ATORVASTATIN CALCIUM 20 MG PO TABS      TAKE 1 TABLET BY MOUTH ONCE DAILY   30 tablet   5   . CALCIUM CARBONATE-VITAMIN D 500-200 MG-UNIT PO TABS   Oral   Take 1 tablet by mouth daily.           Marland Kitchen VITAMIN  D3 1000 UNITS PO CAPS   Oral   Take 1 capsule by mouth daily.           . IBUPROFEN 200 MG PO TABS      as needed.           Marland Kitchen LATANOPROST 0.005 % OP SOLN   Both Eyes   Place 1 drop into both eyes at bedtime.          Marland Kitchen LEVOBUNOLOL HCL 0.5 % OP SOLN   Both Eyes   Place 1 drop into both eyes 2 (two) times daily.          Marland Kitchen LOSARTAN POTASSIUM-HCTZ 100-12.5 MG PO TABS      take 1 tablet by mouth once daily   30 tablet   4   . METOPROLOL TARTRATE 50 MG PO TABS   Oral   Take 1 tablet (50 mg total) by mouth 2 (two) times daily.   60 tablet   6   . ONE-DAILY MULTI VITAMINS PO TABS   Oral   Take 1 tablet by mouth daily.           Marland Kitchen PILOCARPINE HCL 1 % OP SOLN   Left Eye   Place 1 drop into the left eye 3 (three) times daily.         Marland Kitchen POLYETHYLENE GLYCOL 3350 PO POWD   Oral   Take 17 g by mouth as needed.          Marland Kitchen POTASSIUM CHLORIDE ER 10 MEQ PO TBCR   Oral   Take 1 tablet (10 mEq total) by mouth daily.   30 tablet   6   .  TRAVOPROST 0.004 % OP SOLN   Left Eye   Place 1 drop into the left eye 3 (three) times daily.          . WARFARIN SODIUM 2.5 MG PO TABS   Oral   Take 5-7.5 mg by mouth. 3 tabs daily except tues and sat then take 2 tabs           BP 155/105  Pulse 96  Temp 98.4 F (36.9 C) (Oral)  Resp 16  SpO2 99%  Physical Exam  Nursing note and vitals reviewed. Constitutional: She is oriented to person, place, and time. She appears well-developed and well-nourished. No distress.  HENT:  Head: Normocephalic and atraumatic.  Eyes: Conjunctivae normal and EOM are normal.  Cardiovascular: Normal rate and regular rhythm.   Pulmonary/Chest: Effort normal and breath sounds normal. No stridor. No respiratory distress.  Abdominal: She exhibits no distension.  Musculoskeletal: She exhibits no edema.  Neurological: She is alert and oriented to person, place, and time. No cranial nerve deficit. She exhibits normal muscle tone. Coordination normal.       Patient walks with a cane, but is generally steady on her feet.  Skin: Skin is warm and dry.  Psychiatric: She has a normal mood and affect.    ED Course  Procedures (including critical care time)  Labs Reviewed  COMPREHENSIVE METABOLIC PANEL - Abnormal; Notable for the following:    ALT 41 (*)     GFR calc non Af Amer 67 (*)     GFR calc Af Amer 77 (*)     All other components within normal limits  URINALYSIS, ROUTINE W REFLEX MICROSCOPIC - Abnormal; Notable for the following:    Hgb urine dipstick MODERATE (*)     Nitrite POSITIVE (*)     All  other components within normal limits  PROTIME-INR - Abnormal; Notable for the following:    Prothrombin Time 24.9 (*)     INR 2.38 (*)     All other components within normal limits  URINE MICROSCOPIC-ADD ON - Abnormal; Notable for the following:    Bacteria, UA MANY (*)     All other components within normal limits  CBC WITH DIFFERENTIAL  TROPONIN I   Dg Chest 2 View  09/15/2012  *RADIOLOGY  REPORT*  Clinical Data: Shortness of breath, dizziness.  CHEST - 2 VIEW  Comparison: 12/17/2011  Findings: Heart is borderline in size.  No confluent airspace opacities or effusions.  No acute bony abnormality.  Severe degenerative changes in the shoulders.  Mild degenerative changes in the thoracic spine.  IMPRESSION: Borderline heart size.  No acute findings.   Original Report Authenticated By: Charlett Nose, M.D.    Ct Head Wo Contrast  09/15/2012  *RADIOLOGY REPORT*  Clinical Data: Altered mental status, dizziness for 1 day, history hypertension and smoking  CT HEAD WITHOUT CONTRAST  Technique:  Contiguous axial images were obtained from the base of the skull through the vertex without contrast.  Comparison: None  Findings: Minimal age-related atrophy. Normal ventricular morphology. No midline shift or mass effect. Otherwise normal appearance of brain parenchyma. No intracranial hemorrhage, mass lesion, or evidence of acute infarction. No extra-axial fluid collections. Bones and sinuses unremarkable.  IMPRESSION: No acute intracranial abnormalities.   Original Report Authenticated By: Ulyses Southward, M.D.      No diagnosis found.  1:11 PM On repeat exam the patient was walking, without difficulty, beyond using her cane.  She appears steady.  With informed of all results, the patient states that she has recently had polyuria, without any polydipsia.  She denies dysuria again.  Cardiac: 90afib, abnormal  O2- 99%ra, normal   Date: 09/15/2012  Rate: 89  Rhythm: atrial fibrillation  QRS Axis: normal  Intervals: normal  ST/T Wave abnormalities: nonspecific T wave changes  Conduction Disutrbances:none  Narrative Interpretation:   Old EKG Reviewed: unchanged ABNORMAL  MDM  This elderly female presents with concerns of nausea, lightheadedness, dizziness, without focal pain.  On exam she is in no distress, she is neurovascularly intact throughout, with unremarkable vital signs.  Given her new  near-syncopal complaints, she had evaluation with ECG, CT, labs.  Studies are most remarkable for demonstration of urinary tract infection.  The patient's endorsement of new polyuria, which is atypical for her is consistent with this.  Absent chest pain, dyspnea, and with a stable ECG, therapeutic INR, there is low suspicion for acute ongoing coronary ischemia, and with a reassuring neurologic exam, CVA is less likely as well.  She was discharged in stable condition with antibiotics.       Gerhard Munch, MD 09/15/12 1313

## 2012-09-15 NOTE — ED Notes (Addendum)
Pt states she woke up this am around 5.  While cleaning up around her house, she suddenly became SOB, nauseous, and dizzy. Pt states nausea and dizziness has mostly subsided but still feels slightly SOB.  Pt added that she became very dizzy when she tilted her head back this am to put in eyedrops but is better when lying or sitting still.

## 2012-09-18 ENCOUNTER — Ambulatory Visit (INDEPENDENT_AMBULATORY_CARE_PROVIDER_SITE_OTHER): Payer: Medicare Other

## 2012-09-18 ENCOUNTER — Ambulatory Visit (INDEPENDENT_AMBULATORY_CARE_PROVIDER_SITE_OTHER): Payer: Medicare Other | Admitting: Cardiology

## 2012-09-18 VITALS — BP 153/86 | HR 107 | Ht 66.5 in | Wt 135.4 lb

## 2012-09-18 DIAGNOSIS — I4891 Unspecified atrial fibrillation: Secondary | ICD-10-CM

## 2012-09-18 DIAGNOSIS — Z7901 Long term (current) use of anticoagulants: Secondary | ICD-10-CM | POA: Diagnosis not present

## 2012-09-18 DIAGNOSIS — N390 Urinary tract infection, site not specified: Secondary | ICD-10-CM | POA: Diagnosis not present

## 2012-09-18 NOTE — Patient Instructions (Addendum)
Follow-up with [name] NP on 09/30/12   Continue taking your current medications as directed

## 2012-09-18 NOTE — Progress Notes (Signed)
HPI:  [name] is in for follow up.  She had a failed attempt at cardioversion, and has remained on medical therapy.  She has tolerated reasonably well.  She presented to the ER 1.20 with lightheadedness, dizziness.  She was asymptomatic, but had a urinary tract infection and was placed on antibiotics, namely Keflex.  She was also see in the Coumadin clinic today for close follow up.  The ER Md ordered a CT scan.  No acute findings were noted.  She is some better now.    Current Outpatient Prescriptions  Medication Sig Dispense Refill  . atorvastatin (LIPITOR) 20 MG tablet TAKE 1 TABLET BY MOUTH ONCE DAILY  30 tablet  5  . calcium-vitamin D (OSCAL WITH D) 500-200 MG-UNIT per tablet Take 1 tablet by mouth daily.        . cephALEXin (KEFLEX) 500 MG capsule Take 1 capsule (500 mg total) by mouth 2 (two) times daily.  10 capsule  0  . Cholecalciferol (VITAMIN D3) 1000 UNITS CAPS Take 1 capsule by mouth daily.        Marland Kitchen ibuprofen (ADVIL) 200 MG tablet as needed.        . latanoprost (XALATAN) 0.005 % ophthalmic solution Place 1 drop into both eyes at bedtime.       Marland Kitchen levobunolol (BETAGAN) 0.5 % ophthalmic solution Place 1 drop into both eyes 2 (two) times daily.       Marland Kitchen losartan-hydrochlorothiazide (HYZAAR) 100-12.5 MG per tablet take 1 tablet by mouth once daily  30 tablet  4  . metoprolol (LOPRESSOR) 50 MG tablet Take 1 tablet (50 mg total) by mouth 2 (two) times daily.  60 tablet  6  . Multiple Vitamin (MULTIVITAMIN) tablet Take 1 tablet by mouth daily.        . pilocarpine (PILOCAR) 1 % ophthalmic solution Place 1 drop into the left eye 3 (three) times daily.      . polyethylene glycol powder (MIRALAX) powder Take 17 g by mouth as needed.       . potassium chloride (K-DUR) 10 MEQ tablet Take 1 tablet (10 mEq total) by mouth daily.  30 tablet  6  . travoprost, benzalkonium, (TRAVATAN) 0.004 % ophthalmic solution Place 1 drop into the left eye 3 (three) times daily.       Marland Kitchen warfarin (COUMADIN)  2.5 MG tablet Take 5-7.5 mg by mouth. 3 tabs daily except tues and sat then take 2 tabs        No Known Allergies  Past Medical History  Diagnosis Date  . Hypertension   . Hyperlipidemia   . Glaucoma(365)   . Cigarette smoker   . Dyspepsia   . Diverticulosis of colon   . Colonic polyp   . Family history of colon cancer   . Hemorrhoids   . Other specified disorder of rectum and anus   . Renal cyst   . DJD (degenerative joint disease)   . Osteopenia   . Vitamin D deficiency   . Restless leg syndrome   . Anxiety   . Depression   . Dysrhythmia     A-fib    Past Surgical History  Procedure Date  . Total hip arthroplasty 2/05    right by Dr. Shelle Iron  . Cataract extraction   . Upper gastrointestinal endoscopy   . Colonoscopy   . Retina surgery x 2 2013  . Cardioversion 04/23/2012    Procedure: CARDIOVERSION;  Surgeon: Herby Abraham, MD;  Location: [facility] ENDOSCOPY;  Service: Cardiovascular;  Laterality: N/A;    Family History  Problem Relation Age of Onset  . Hypertension Mother   . Diabetes Mother   . Breast cancer Other     niece  . Colon cancer Brother   . Prostate cancer Father   . Diabetes Brother   . Diabetes Sister     History   Social History  . Marital Status: Widowed    Spouse Name: N/A    Number of Children: 0  . Years of Education: N/A   Occupational History  . Retired    Social History Main Topics  . Smoking status: Light Tobacco Smoker -- 0.3 packs/day  . Smokeless tobacco: Never Used     Comment: 6 cig a day  . Alcohol Use: No  . Drug Use: No  . Sexually Active: Not on file   Other Topics Concern  . Not on file   Social History Narrative   Exercises 2 times per weekCaffeine use: 1 cup coffee per day    ROS: Please see the HPI.  All other systems reviewed and negative.  PHYSICAL EXAM:  BP 153/86  Pulse 107  Ht 5' 6.5" (1.689 m)  Wt 135 lb 6.4 oz (61.417 kg)  BMI 21.53 kg/m2  General: Thin older women in no distress Head:   Normocephalic and atraumatic. Neck: no JVD Lungs: Clear to auscultation and percussion. Heart: irregularly, irregular rhythm.   Pulses: Pulses normal in all 4 extremities. Extremities: No clubbing or cyanosis. No edema. Neurologic: Alert and oriented x 3.  EKG:  Tracing of 1/20  Atrial fib with CVR.   CTHEAD   CT HEAD WITHOUT CONTRAST  Technique: Contiguous axial images were obtained from the base of the skull through the vertex without contrast.  Comparison: None  Findings: Minimal age-related atrophy. Normal ventricular morphology. No midline shift or mass effect. Otherwise normal appearance of brain parenchyma. No intracranial hemorrhage, mass lesion, or evidence of acute infarction. No extra-axial fluid collections. Bones and sinuses unremarkable.  IMPRESSION: No acute intracranial abnormalities.   Original Report Authenticated By: Ulyses Southward, M.D.        Last Resulted: 09/15/12 12:20 PM       ASSESSMENT AND PLAN:

## 2012-09-22 ENCOUNTER — Other Ambulatory Visit: Payer: Self-pay | Admitting: Pulmonary Disease

## 2012-09-22 NOTE — Assessment & Plan Note (Signed)
Rate is a little faster today but it is back down when I examined her.  Continue to monitor and she will continue with warfarin.

## 2012-09-22 NOTE — Assessment & Plan Note (Signed)
See ER note.  Currently on antibiotics.  She should have gen med follow up.  We will have her return to see one of our extenders to also ensure stability anticoag and rate wise.

## 2012-09-25 ENCOUNTER — Encounter: Payer: Self-pay | Admitting: Adult Health

## 2012-09-25 ENCOUNTER — Other Ambulatory Visit (INDEPENDENT_AMBULATORY_CARE_PROVIDER_SITE_OTHER): Payer: Medicare Other

## 2012-09-25 ENCOUNTER — Ambulatory Visit (INDEPENDENT_AMBULATORY_CARE_PROVIDER_SITE_OTHER): Payer: Medicare Other | Admitting: Adult Health

## 2012-09-25 VITALS — BP 128/76 | HR 102 | Temp 97.9°F | Ht 66.5 in | Wt 132.0 lb

## 2012-09-25 DIAGNOSIS — N390 Urinary tract infection, site not specified: Secondary | ICD-10-CM

## 2012-09-25 LAB — URINALYSIS, ROUTINE W REFLEX MICROSCOPIC
Leukocytes, UA: NEGATIVE
Specific Gravity, Urine: 1.025 (ref 1.000–1.030)
Urobilinogen, UA: 0.2 (ref 0.0–1.0)

## 2012-09-25 NOTE — Patient Instructions (Addendum)
Continue on current regimen.  follow up Dr. Kriste Basque  As planned and As needed   I will call with labs  Drink water to help flush kidneys, wear cotton underwear , urinate frequently.  Please contact office for sooner follow up if symptoms do not improve or worsen or seek emergency care

## 2012-09-25 NOTE — Assessment & Plan Note (Signed)
Recent UTI now improved clinically  Repeat UA/Cx today per request   Advised on urinary hygiene measures.  follow up As needed  And as planned with Dr. Kriste Basque

## 2012-09-25 NOTE — Progress Notes (Signed)
Quick Note:  LMOM TCB x1. ______ 

## 2012-09-25 NOTE — Progress Notes (Signed)
Subjective:    Patient ID: [name], female    DOB: 04-24-1934, 79 y.o.   MRN: [id number]  HPI 79 y/o BF here for a follow up visit... she has HBP, Chol, DJD, and mult med problems as noted below...   ~  December 21, 2010:  22mo ROV & she reports 2 cataract surgeries by DrShapiro followed by ?retinal surg by DrRankin (we don't have records);  Her chief complaint is hemorrhoids which she says are no better after suppos Rx & AnusolHC cream Rx> she wonders about going to see [facility] having seen advert on TV for his hem rx, but we decided to f/u w/ DrDBrodie here;  Still smoking but down to <1/2ppd she says, classic chr bronchitic AM cough/ phlegm & we discussed Mucinex prn (she doesn't want smoking cessation help or breathing meds "my breathing is fine");  BP controlled on meds- denies CP, palpit, dizzy, edema, etc;  Due for fasting blood work & she will ret for this> all normal...  ~  June 22, 2011:  22mo ROV & she has had a good interval, no new complaints or concerns; she brings in a Living Will for [place] to scan into the record; ok flu shot...    Cig smoker> still smokes ~1/2ppd & does not want smoking cessation counseling or help; denies cough, phlegm, hemoptysis, CP, SOB, etc...    HBP> on Metoprolol50, Hyzaar100; BP= 148/82 today & even better at home- reminded to reduce sodium, take meds daily; she denies CP, palpit, SOB, edema, etc.    CHOL> on Lipitor20; FLP looks good & all parameters are at goal...    GI> on Prilosec OTC prn, Miralax, Metamucil, Analpram Cream; she's had considerable difficulty w/ hemorrhoids but now says "it's not my hem's at all, it's my glands" w/ evals from DrDBrodie, DrToth ([facility]), and DrFuller at [facility]... Follow up colonoscopy 5/12 by DrBrodie showed severe diverticulosis & rectal prolapse...    DJD> on Ca++ MVI, VitD, Advil; she had right THR in 2005...    Anxiety> she has been on Zoloft & Rebeca Allegra in the past...  ~  December 17, 2011:  22mo ROV & [name] is concerned about  her vision> continued f/u DrShapiro & DrRankin w/ more surg that she doesn't understand & we don't have any notes to review;  Still smoking 1/2ppd but denies much cough, sputum, no hemoptysis, SOB, etc> she is due for f/u CXR= stable scarring at the bases, DJD in spine, NAD;  BP controlled on her & Chol ok on her Lip20> see prob list below>> CXR 4/13 showed normal heart size, clear lungs w/ mild scarring at the bases, DJD in spine, NAD... LABS 4/13:  FLP- at goals on Lip20;  Chems- wnl;  CBC- wnl;  TSH=0.92;  VitD=31  ~  June 18, 2012:  22mo ROV & [name] developed AFib in the interval, seen by DrStuckey, tried Cardioversion but this was unsuccessful & pt prefers to remain in AFib w/ rate control on Metoprolol & Coumadin anticoagulation...     Cig smoker> still smokes ~1/2ppd & does not want smoking cessation counseling or help; denies cough, phlegm, hemoptysis, CP, SOB, etc...    HBP> on Metoprolol50Bid, Hyzaar100/12.5, & K10; BP= 138/90 today & even better at home- reminded to reduce sodium, take meds daily; BP sl elev today but she does NOT want to inc meds!    AFib> on above + Coumadin via the [facility]; unsuccessful cardioversion attempt 8/13; good rate control, she is stable & denies  CP, palpit, SOB, edema...    CHOL> on Lipitor20; FLP looks good & all parameters are at goal, she is not fasting today...    GI> on Prilosec OTC prn, Miralax, Metamucil, Analpram Cream; she's had considerable difficulty w/ hemorrhoids but now says "it's not my hem's at all, it's my glands" w/ evals from DrDBrodie, DrToth ([facility]), and DrFuller at [facility]... Follow up colonoscopy 5/12 by DrBrodie showed severe diverticulosis & rectal prolapse...    DJD, osteopenia> on Ca++ MVI, VitD, Advil; she had right THR in 2005...    Anxiety> she has been on Zoloft & Alpraz in the past... We reviewed prob list, meds, xrays and labs> see below for updates >> OK Flu shot today... LABS 8/13:  Chems- wnl;  CBC- wnl;  Protimes per Coumadin  Clinic... EKG 9/13 showed AFib, rate 78; NSSTTWA...   09/25/2012 ER follow up  Seen in ER 2 weeks ago, found to have a UTI. Tx w/ Keflex .   She finished 5days ago.  reports symptoms are resolved except for fatigue Wants to have urine rechecked today .  No dysuria, urgency or nocturia.  No fever. Or back pain.  Pt had some weakness and dizziness in ER , CT head done was w/ no acute changes.           Problem List:      FATIGUE (ICD-780.79) - she rests OK, wakes tired most days, no daytime hypersomnolence...  GLAUCOMA (ICD-365.9) - on eye drops per ophthalmology;  Had bilat cat surg by DrShapiro, then required retinal surg by DrRankin (we don't have records) & she describes on-going prob w/ her vision that has her concerned...  CIGARETTE SMOKER (ICD-305.1) - still smokes ~1/2ppd & must quit, she knows the risks & not interested in smoking cessation help; she is reminded at each & every OV how important it is to quit smoking!!! She refuses smoking cessation help... ~  CXR 10/07 showed sl hyperinflation, mild incr Cor... ~  CXR in ER 10/09 showed COPD, bibasilar scarring, NAD.Marland Kitchen. ~  CXR 10/10 showed similar findings- scarring, NAD.Marland Kitchen. ~  CXR 10/11 showed COPD/ Emphysema, bibasilar scarring, DJD sp, NAD. ~  CXR 4/13 showed normal heart size, clear lungs w/ mild scarring at the bases, DJD in spine, NAD...  HYPERTENSION (ICD-401.9) - controlled on ASA 81mg /d, METOPROLOL 50mg /d & LOSARTAN/HCT 100-12.5 daily..  ~  10/11:  BP= 134/76 & she tells me that she sometimes takes these meds Qod and that she feels better on this schedule & wishes to continue this way... ~  4/12:  BP= 140/70 & she denies HA, CP, palpit, SOB, edema, etc... ~  10/12:  BP= 148/82 & she remains asymptomatic... ~  4/13:  BP= 140/82 & she denies HAs, CP, palpit, dizzy, SOB, edema... ~  10/13:  BP= 138/90 & she denies symptoms and does not want to incr meds at this time, she has f/u DrStuckey soon...  HYPERCHOLESTEROLEMIA  (ICD-272.0) - on LIPITOR 20mg /d... ~  FLP 4/09 showed TChol 181, TG 73, HDL 54, LDL 112 ~  FLP 4/10 showed TChol 168, TG 63, HDL 53, LDL 102 ~  FLP 4/11 on Lip20 showed TChol 169, TG 64, HDL 57, LDL 100 ~  FLP 4/12 on Lip20 showed TChol 163, TG 52, HDL 70, LDL 83... Continue same. ~  FLP 4/13 on Lip20 showed TChol 170, TG 46, HDL 79, LDL 82  DYSPEPSIA (ICD-536.8) - uses PRILOSEC OTC as needed...  DIVERTICULOSIS OF COLON (ICD-562.10) - she alternates MIRALAX &  METAMUCIL Qod... COLONIC POLYPS (ICD-211.3)  Family Hx of COLON CANCER (ICD-153.9) HEMORRHOIDS (ICD-455.6) - "Hemorrhoids are my biggest problem" > she will f/u GI- DrDBrodie... OTHER SPECIFIED DISORDER OF RECTUM AND ANUS (ICD-569.49) -  - eval by DrToth & referred to [facility] DrFuller for painful defecation... he recommended muscle retraining but initially her insurance wouldn't cover this, later they did cover & she had the "muscle therapy" now doing home therapy... ~  Colonoscopy by DrBrodie 6/07 showed divertics, hems... ~  Colonoscopy 5/12 showed severe diverticulosis & rectal prolapse...  RENAL CYST (ICD-593.2) - Abd Sonar 11/09 showed sm bilat renal cysts...  DEGENERATIVE JOINT DISEASE (ICD-715.90) - s/p right THR 3/05 by DrBeane... she also has a known lumbar spondylosis w/ right leg radiculopathy... she states that exercise is helping... ~  10/10: c/o knee pain- she declines Rheum/ Ortho referral... continue OTC meds (Advil) Prn...  OSTEOPENIA (ICD-733.90) & VITAMIN D DEFICIENCY (ICD-268.9) -  ~  labs 4/10 showed Vit D level = 23 & started on Vit D 1000 u OTC daily... ~  BMD 5/11 showed TScores -1.2 in Spine, and -1.6 in [facility]... on Calcium, MVI, Vit D. ~  labs 5/11 showed Vit D level = 41... continue 1000 u daily. ~  Labs 4/13 showed Vit D level = 31  Hx of RESTLESS LEGS SYNDROME (ICD-333.94) - she tried Requip 1mg  Qhs in the past> now off...  ANXIETY (ICD-300.00) - she states that she stopped the Alprazolam (prev 0.5mg   Prn)... ~  10/09 states depressed- 2 sisters died 01-13-2023- one w/ ca pancreas, other w/ DM/ ASPVD.Marland Kitchen. tried Zofoft transiently then stopped.  HEALTH MAINTENANCE:  she sees DrMezer et al for GYN, neg mammogram 3/12... she gets the yearly seasonal Flu vaccines, and had the PNEUMOVAX in 2005 at age 63...   Past Surgical History  Procedure Date  . Total hip arthroplasty 2/05    right by Dr. Shelle Iron  . Cataract extraction   . Upper gastrointestinal endoscopy   . Colonoscopy   . Retina surgery x 2 2013  . Cardioversion 04/23/2012    Procedure: CARDIOVERSION;  Surgeon: Herby Abraham, MD;  Location: [facility] ENDOSCOPY;  Service: Cardiovascular;  Laterality: N/A;    Outpatient Encounter Prescriptions as of 09/25/2012  Medication Sig Dispense Refill  . atorvastatin (LIPITOR) 20 MG tablet TAKE 1 TABLET BY MOUTH ONCE DAILY  30 tablet  5  . calcium-vitamin D (OSCAL WITH D) 500-200 MG-UNIT per tablet Take 1 tablet by mouth daily.        . cephALEXin (KEFLEX) 500 MG capsule Take 1 capsule (500 mg total) by mouth 2 (two) times daily.  10 capsule  0  . Cholecalciferol (VITAMIN D3) 1000 UNITS CAPS Take 1 capsule by mouth daily.        Marland Kitchen ibuprofen (ADVIL) 200 MG tablet as needed.        . latanoprost (XALATAN) 0.005 % ophthalmic solution Place 1 drop into both eyes at bedtime.       Marland Kitchen levobunolol (BETAGAN) 0.5 % ophthalmic solution Place 1 drop into both eyes 2 (two) times daily.       Marland Kitchen losartan-hydrochlorothiazide (HYZAAR) 100-12.5 MG per tablet take 1 tablet by mouth once daily  30 tablet  4  . metoprolol (LOPRESSOR) 50 MG tablet Take 1 tablet (50 mg total) by mouth 2 (two) times daily.  60 tablet  6  . Multiple Vitamin (MULTIVITAMIN) tablet Take 1 tablet by mouth daily.        . pilocarpine (PILOCAR) 1 %  ophthalmic solution Place 1 drop into the left eye 3 (three) times daily.      . polyethylene glycol powder (MIRALAX) powder Take 17 g by mouth as needed.       . potassium chloride (K-DUR) 10 MEQ tablet Take  1 tablet (10 mEq total) by mouth daily.  30 tablet  6  . travoprost, benzalkonium, (TRAVATAN) 0.004 % ophthalmic solution Place 1 drop into the left eye 3 (three) times daily.       Marland Kitchen warfarin (COUMADIN) 2.5 MG tablet Take 5-7.5 mg by mouth. 3 tabs daily except tues and sat then take 2 tabs        No Known Allergies   Current Medications, Allergies, Past Medical History, Past Surgical History, Family History, and Social History were reviewed in [employer] record.    Review of Systems Constitutional:   No  weight loss, night sweats,  Fevers, chills,  +fatigue, or  lassitude.  HEENT:   No headaches,  Difficulty swallowing,  Tooth/dental problems, or  Sore throat,                No sneezing, itching, ear ache, nasal congestion, post nasal drip,   CV:  No chest pain,  Orthopnea, PND, swelling in lower extremities, anasarca, dizziness, palpitations, syncope.   GI  No heartburn, indigestion, abdominal pain, nausea, vomiting, diarrhea, change in bowel habits, loss of appetite, bloody stools.   Resp: No shortness of breath with exertion or at rest.  No excess mucus, no productive cough,  No non-productive cough,  No coughing up of blood.  No change in color of mucus.  No wheezing.  No chest wall deformity  Skin: no rash or lesions.  GU: no dysuria, change in color of urine, no urgency or frequency.  No flank pain, no hematuria   MS:  No joint pain or swelling.  No decreased range of motion.     Psych:  No change in mood or affect. No depression or anxiety.  No memory loss.            Objective:   Physical Exam     WD, WN, 78 y/o BF in NAD... GENERAL:  Alert & oriented; pleasant & cooperative. HEENT:  [place]/AT,  EACs-clear, TMs-wnl, NOSE-clear, THROAT-clear & wnl. NECK:  Supple w/ fairROM; no JVD; normal carotid impulses w/o bruits; no thyromegaly or nodules palpated; no lymphadenopathy. CHEST:  Clear to P & A; without wheezes/ rales/ or rhonchi. HEART:   Regular Rhythm;  gr 1/6 SEM without rubs or gallops heard... ABDOMEN:  Soft & nontender; normal bowel sounds; no organomegaly or masses detected. EXT: without deformities, mod arthritic changes; no varicose veins/ +venous insuffic/ tr edema. NEURO:     no focal neuro deficits... DERM:  No lesions noted; no rash etc...     Assessment & Plan:

## 2012-09-26 NOTE — Progress Notes (Signed)
Quick Note:  Patient returned call. Advised of lab results / recs as stated by TP. Pt verbalized understanding and denied any questions. ______ 

## 2012-09-27 LAB — URINE CULTURE: Colony Count: 70000

## 2012-09-29 ENCOUNTER — Other Ambulatory Visit: Payer: Self-pay | Admitting: Adult Health

## 2012-09-29 ENCOUNTER — Telehealth: Payer: Self-pay | Admitting: Adult Health

## 2012-09-29 MED ORDER — LEVOFLOXACIN 500 MG PO TABS: 500.0000 mg | ORAL_TABLET | Freq: Every day | ORAL | Status: DC

## 2012-09-29 NOTE — Telephone Encounter (Signed)
Yes, TP is aware: Result Notes     Notes Recorded by Sherre Lain, MA on 09/29/2012 at 12:43 PM Called spoke with patient, advised of lab results / recs as stated by TP. Pt verbalized her understanding and denied any questions. Orders only encounter created for Levaquin rx; pt aware to contact [facility] regarding new abx start. ------  Notes Recorded by Julio Sicks, NP on 09/29/2012 at 9:13 AM UA still shows infection  Rec: Levaquin 500mg  daily for 7days  Push fluids  Contact coumadin clinic that you have begun an abx.  Please contact office for sooner follow up if symptoms do not improve or worsen or seek emergency care    ======================================================================= [facility], spoke with pharmacist Delice Bison and informed her that TP is aware and that pt is aware to contact the coumadin clinic.  Also, called pt to inquire about the medication for yeast infection.  Pt stated that she did not call and does not need anything for this.  Will sign off.

## 2012-09-29 NOTE — Telephone Encounter (Signed)
Disregard last documentation about pharmacy and yeast infection.  Wrong info.

## 2012-09-29 NOTE — Progress Notes (Signed)
Result Notes     Notes Recorded by Sherre Lain, MA on 09/29/2012 at 12:43 PM Called spoke with patient, advised of lab results / recs as stated by TP. Pt verbalized her understanding and denied any questions. Orders only encounter created for Levaquin rx; pt aware to contact CC regarding new abx start. ------  Notes Recorded by Julio Sicks, NP on 09/29/2012 at 9:13 AM UA still shows infection  Rec: Levaquin 500mg  daily for 7days  Push fluids  Contact coumadin clinic that you have begun an abx.  Please contact office for sooner follow up if symptoms do not improve or worsen or seek emergency care

## 2012-09-29 NOTE — Progress Notes (Signed)
Quick Note:  Called spoke with patient, advised of lab results / recs as stated by TP. Pt verbalized her understanding and denied any questions. Orders only encounter created for Levaquin rx; pt aware to contact CC regarding new abx start. ______

## 2012-09-29 NOTE — Telephone Encounter (Signed)
[facility].  Also, needing a rx for yeast infection.

## 2012-09-30 ENCOUNTER — Ambulatory Visit (INDEPENDENT_AMBULATORY_CARE_PROVIDER_SITE_OTHER): Payer: Medicare Other | Admitting: Nurse Practitioner

## 2012-09-30 ENCOUNTER — Encounter: Payer: Self-pay | Admitting: Nurse Practitioner

## 2012-09-30 VITALS — BP 148/78 | HR 105 | Ht 66.5 in | Wt 132.4 lb

## 2012-09-30 DIAGNOSIS — I4891 Unspecified atrial fibrillation: Secondary | ICD-10-CM | POA: Diagnosis not present

## 2012-09-30 MED ORDER — METOPROLOL TARTRATE 50 MG PO TABS: 75.0000 mg | ORAL_TABLET | Freq: Two times a day (BID) | ORAL | Status: DC

## 2012-09-30 NOTE — Progress Notes (Signed)
[name] Date of Birth: 20-Oct-1933 Medical Record #[id number]  History of Present Illness: [name] is seen back today for a follow up visit. She is seen for Dr. Riley Kill. She has chronic atrial fib with failed attempt at cardioversion.  She remains on chronic anticoagulation.  Other issues include HTN and HLD. No CAD noted.   She was here last month. Remains in atrial fib. Rate was up a little. Had a concurrent UTI.   She comes back today. She is here alone. Still with a UTI. Having bowel issues as well. Does not "wipe front to back". Now to start Levaquin. Asking if it would interfere with her coumadin. No chest pain. Does endorse tachy palpitations. Not dizzy or lightheaded. No syncope. No falls. Uses a cane.   Current Outpatient Prescriptions on File Prior to Visit  Medication Sig Dispense Refill  . atorvastatin (LIPITOR) 20 MG tablet TAKE 1 TABLET BY MOUTH ONCE DAILY  30 tablet  5  . calcium-vitamin D (OSCAL WITH D) 500-200 MG-UNIT per tablet Take 1 tablet by mouth daily.        . Cholecalciferol (VITAMIN D3) 1000 UNITS CAPS Take 1 capsule by mouth daily.        Marland Kitchen ibuprofen (ADVIL) 200 MG tablet as needed.        . latanoprost (XALATAN) 0.005 % ophthalmic solution Place 1 drop into both eyes at bedtime.       Marland Kitchen levobunolol (BETAGAN) 0.5 % ophthalmic solution Place 1 drop into both eyes 2 (two) times daily.       Marland Kitchen levofloxacin (LEVAQUIN) 500 MG tablet Take 1 tablet (500 mg total) by mouth daily.  7 tablet  0  . losartan-hydrochlorothiazide (HYZAAR) 100-12.5 MG per tablet take 1 tablet by mouth once daily  30 tablet  4  . metoprolol (LOPRESSOR) 50 MG tablet Take 1.5 tablets (75 mg total) by mouth 2 (two) times daily.  60 tablet  6  . Multiple Vitamin (MULTIVITAMIN) tablet Take 1 tablet by mouth daily.        . pilocarpine (PILOCAR) 1 % ophthalmic solution Place 1 drop into the left eye 3 (three) times daily.      . polyethylene glycol powder (MIRALAX) powder Take 17 g by mouth  as needed.       . potassium chloride (K-DUR) 10 MEQ tablet Take 1 tablet (10 mEq total) by mouth daily.  30 tablet  6  . travoprost, benzalkonium, (TRAVATAN) 0.004 % ophthalmic solution Place 1 drop into the left eye 3 (three) times daily.       Marland Kitchen warfarin (COUMADIN) 2.5 MG tablet Take 5-7.5 mg by mouth. 3 tabs daily except tues and sat then take 2 tabs        No Known Allergies  Past Medical History  Diagnosis Date  . Hypertension   . Hyperlipidemia   . Glaucoma(365)   . Cigarette smoker   . Dyspepsia   . Diverticulosis of colon   . Colonic polyp   . Family history of colon cancer   . Hemorrhoids   . Other specified disorder of rectum and anus   . Renal cyst   . DJD (degenerative joint disease)   . Osteopenia   . Vitamin D deficiency   . Restless leg syndrome   . Anxiety   . Depression   . Dysrhythmia     A-fib    Past Surgical History  Procedure Date  . Total hip arthroplasty 2/05    right  by Dr. Shelle Iron  . Cataract extraction   . Upper gastrointestinal endoscopy   . Colonoscopy   . Retina surgery x 2 2013  . Cardioversion 04/23/2012    Procedure: CARDIOVERSION;  Surgeon: Herby Abraham, MD;  Location: [facility] ENDOSCOPY;  Service: Cardiovascular;  Laterality: N/A;    History  Smoking status  . Light Tobacco Smoker -- 0.3 packs/day  Smokeless tobacco  . Never Used    Comment: 6 cig a day    History  Alcohol Use No    Family History  Problem Relation Age of Onset  . Hypertension Mother   . Diabetes Mother   . Breast cancer Other     niece  . Colon cancer Brother   . Prostate cancer Father   . Diabetes Brother   . Diabetes Sister     Review of Systems: The review of systems is per the HPI.  All other systems were reviewed and are negative.  Physical Exam: BP 148/78  Pulse 105  Ht 5' 6.5" (1.689 m)  Wt 132 lb 6.4 oz (60.056 kg)  BMI 21.05 kg/m2 Patient is very pleasant and in no acute distress. She is using a cane. Skin is warm and dry. Color is  normal.  HEENT is unremarkable. Normocephalic/atraumatic. PERRL. Sclera are nonicteric. Neck is supple. No masses. No JVD. Lungs are clear. Cardiac exam shows an irregular rate and rhythm. Rate is 110 by me. Abdomen is soft. Extremities are without edema. Gait and ROM are intact. No gross neurologic deficits noted.   LABORATORY DATA: Lab Results  Component Value Date   WBC 4.1 09/15/2012   HGB 14.2 09/15/2012   HCT 42.0 09/15/2012   PLT 156 09/15/2012   GLUCOSE 90 09/15/2012   CHOL 170 12/19/2011   TRIG 46.0 12/19/2011   HDL 79.20 12/19/2011   LDLCALC 82 12/19/2011   ALT 41* 09/15/2012   AST 34 09/15/2012   NA 139 09/15/2012   K 4.1 09/15/2012   CL 106 09/15/2012   CREATININE 0.83 09/15/2012   BUN 14 09/15/2012   CO2 25 09/15/2012   TSH 0.94 02/01/2012   INR 2.8 09/18/2012    Lab Results  Component Value Date   INR 2.8 09/18/2012   INR 2.38* 09/15/2012   INR 2.6 09/05/2012     Assessment / Plan: 1. Atrial fib - rate is a little fast to me. She has some tachy palpitations. I have increased her Metoprolol to 75 mg BID. I will see her back in about 2 weeks and recheck her.  2. Recurrent UTI - we discussed hygiene (wipe front to back) especially since she is having some bowel issues as well.   3. Chronic coumadin - starting Levaquin today. Will check protime later this week.   Patient is agreeable to this plan and will call if any problems develop in the interim.

## 2012-09-30 NOTE — Patient Instructions (Signed)
We are going to increase the Metoprolol to 75 mg two times a week (one and a half tablets) -  This should help slow your heart rate down  I will see you in 2 weeks with an EKG  We will need to check a protime later this week since you are starting a new antibiotic  Call the [facility] office at [phone] if you have any questions, problems or concerns.

## 2012-10-03 ENCOUNTER — Ambulatory Visit (INDEPENDENT_AMBULATORY_CARE_PROVIDER_SITE_OTHER): Payer: Medicare Other | Admitting: *Deleted

## 2012-10-03 DIAGNOSIS — Z7901 Long term (current) use of anticoagulants: Secondary | ICD-10-CM

## 2012-10-03 DIAGNOSIS — I4891 Unspecified atrial fibrillation: Secondary | ICD-10-CM

## 2012-10-03 LAB — POCT INR: INR: 3

## 2012-10-14 ENCOUNTER — Encounter: Payer: Self-pay | Admitting: Nurse Practitioner

## 2012-10-14 ENCOUNTER — Ambulatory Visit (INDEPENDENT_AMBULATORY_CARE_PROVIDER_SITE_OTHER): Payer: Medicare Other | Admitting: Nurse Practitioner

## 2012-10-14 VITALS — BP 112/80 | HR 98 | Ht 66.0 in | Wt 138.4 lb

## 2012-10-14 DIAGNOSIS — N390 Urinary tract infection, site not specified: Secondary | ICD-10-CM | POA: Diagnosis not present

## 2012-10-14 DIAGNOSIS — I4891 Unspecified atrial fibrillation: Secondary | ICD-10-CM

## 2012-10-14 LAB — URINALYSIS, ROUTINE W REFLEX MICROSCOPIC
Bilirubin Urine: NEGATIVE
Ketones, ur: NEGATIVE
Leukocytes, UA: NEGATIVE
Nitrite: NEGATIVE
Specific Gravity, Urine: 1.02 (ref 1.000–1.030)
Total Protein, Urine: NEGATIVE
Urine Glucose: NEGATIVE
Urobilinogen, UA: 0.2 (ref 0.0–1.0)
pH: 5.5 (ref 5.0–8.0)

## 2012-10-14 NOTE — Progress Notes (Addendum)
[name] Date of Birth: [birth year] Medical Record #[id number]  History of Present Illness: [name] is seen back today for a follow up visit. She is seen Dr. Riley Kill. She has chronic atrial fib with failed attempt at cardioversion. She remains on chronic anticoagulation. She also has HTN, HLD and does not have CAD. She does smoke - 4 to 5 cigs per day.   I saw her earlier this month. Heart rate was still up. Did endorse tachy palpitations. We increased her Metoprolol. She has also had issues with recurrent UTIs.  She comes in today. She is here alone. She is doing better from a cardiac standpoint. No more palpitations. No chest pain. Not short of breath. Does still smoke a few cigs per day. Says this is for her "nerves". She has finished her antibiotics. Not sure if her UTI is resolved or not. Will recheck today. She is staying active. She does ask about an alternative to Coumadin.   Current Outpatient Prescriptions on File Prior to Visit  Medication Sig Dispense Refill  . atorvastatin (LIPITOR) 20 MG tablet TAKE 1 TABLET BY MOUTH ONCE DAILY  30 tablet  5  . calcium-vitamin D (OSCAL WITH D) 500-200 MG-UNIT per tablet Take 1 tablet by mouth daily.        . Cholecalciferol (VITAMIN D3) 1000 UNITS CAPS Take 1 capsule by mouth daily.        Marland Kitchen ibuprofen (ADVIL) 200 MG tablet as needed.        . latanoprost (XALATAN) 0.005 % ophthalmic solution Place 1 drop into both eyes at bedtime.       Marland Kitchen levobunolol (BETAGAN) 0.5 % ophthalmic solution Place 1 drop into both eyes 2 (two) times daily.       Marland Kitchen losartan-hydrochlorothiazide (HYZAAR) 100-12.5 MG per tablet take 1 tablet by mouth once daily  30 tablet  4  . metoprolol (LOPRESSOR) 50 MG tablet Take 1.5 tablets (75 mg total) by mouth 2 (two) times daily.  60 tablet  6  . Multiple Vitamin (MULTIVITAMIN) tablet Take 1 tablet by mouth daily.        . pilocarpine (PILOCAR) 1 % ophthalmic solution Place 1 drop into the left eye 3 (three) times  daily.      . polyethylene glycol powder (MIRALAX) powder Take 17 g by mouth as needed.       . potassium chloride (K-DUR) 10 MEQ tablet Take 1 tablet (10 mEq total) by mouth daily.  30 tablet  6  . travoprost, benzalkonium, (TRAVATAN) 0.004 % ophthalmic solution Place 1 drop into the left eye 3 (three) times daily.       Marland Kitchen warfarin (COUMADIN) 2.5 MG tablet Take 5-7.5 mg by mouth. 3 tabs daily except tues and sat then take 2 tabs       No current facility-administered medications on file prior to visit.    No Known Allergies  Past Medical History  Diagnosis Date  . Hypertension   . Hyperlipidemia   . Glaucoma(365)   . Cigarette smoker   . Dyspepsia   . Diverticulosis of colon   . Colonic polyp   . Family history of colon cancer   . Hemorrhoids   . Other specified disorder of rectum and anus   . Renal cyst   . DJD (degenerative joint disease)   . Osteopenia   . Vitamin D deficiency   . Restless leg syndrome   . Anxiety   . Depression   . Dysrhythmia  A-fib    Past Surgical History  Procedure Laterality Date  . Total hip arthroplasty  2/05    right by Dr. Shelle Iron  . Cataract extraction    . Upper gastrointestinal endoscopy    . Colonoscopy    . Retina surgery x 2  2013  . Cardioversion  04/23/2012    Procedure: CARDIOVERSION;  Surgeon: Herby Abraham, MD;  Location: [facility] ENDOSCOPY;  Service: Cardiovascular;  Laterality: N/A;    History  Smoking status  . Light Tobacco Smoker -- 0.30 packs/day  Smokeless tobacco  . Never Used    Comment: 6 cig a day    History  Alcohol Use No    Family History  Problem Relation Age of Onset  . Hypertension Mother   . Diabetes Mother   . Breast cancer Other     niece  . Colon cancer Brother   . Prostate cancer Father   . Diabetes Brother   . Diabetes Sister     Review of Systems: The review of systems is per the HPI.  All other systems were reviewed and are negative.  Physical Exam: BP 112/80  Pulse 98  Ht 5\' 6"   (1.676 m)  Wt 138 lb 6.4 oz (62.778 kg)  BMI 22.35 kg/m2 Patient is very pleasant and in no acute distress. Skin is warm and dry. Color is normal.  HEENT is unremarkable. Normocephalic/atraumatic. PERRL. Sclera are nonicteric. Neck is supple. No masses. No JVD. Lungs are clear. Cardiac exam shows an irregular rhythm. Abdomen is soft. Extremities are without edema. Gait and ROM are intact. No gross neurologic deficits noted.  LABORATORY DATA: EKG today shows atrial fib with a more controlled VR. Rate is 98.   Lab Results  Component Value Date   WBC 4.1 09/15/2012   HGB 14.2 09/15/2012   HCT 42.0 09/15/2012   PLT 156 09/15/2012   GLUCOSE 90 09/15/2012   CHOL 170 12/19/2011   TRIG 46.0 12/19/2011   HDL 79.20 12/19/2011   LDLCALC 82 12/19/2011   ALT 41* 09/15/2012   AST 34 09/15/2012   NA 139 09/15/2012   K 4.1 09/15/2012   CL 106 09/15/2012   CREATININE 0.83 09/15/2012   BUN 14 09/15/2012   CO2 25 09/15/2012   TSH 0.94 02/01/2012   INR 3.0 10/03/2012    Assessment / Plan: 1. Atrial fib - rate is better. She feels better clinically. Continue with rate control and anticoagulation.   2. Chronic anticoagulation - we talked about the other options. She has decided to stay with her current regimen.   3. Tobacco abuse - she is encouraged to stop.  4. UTI - recheck today.   Overall, I think she is doing better. I will see her back in 4 months. I have informed her of Dr. Rosalyn Charters retirement. Will ask for his input as to who he would like to follow her long term.   Patient is agreeable to this plan and will call if any problems develop in the interim.    Reviewed by Dr. Riley Kill. Will decide at her next follow up. Probably [facility]

## 2012-10-14 NOTE — Patient Instructions (Signed)
Stay on your current medicines  We will check a UA today  I will see you in 4 months  Stay active!  Call the [facility] office at [phone] if you have any questions, problems or concerns.

## 2012-10-15 ENCOUNTER — Other Ambulatory Visit: Payer: Self-pay | Admitting: *Deleted

## 2012-10-15 MED ORDER — WARFARIN SODIUM 2.5 MG PO TABS: ORAL_TABLET | ORAL | Status: DC

## 2012-10-16 ENCOUNTER — Telehealth: Payer: Self-pay | Admitting: *Deleted

## 2012-10-16 NOTE — Telephone Encounter (Signed)
Advised patient

## 2012-10-16 NOTE — Telephone Encounter (Signed)
Message copied by Burnell Blanks on Thu Oct 16, 2012  8:57 AM ------      Message from: Rosalio Macadamia      Created: Tue Oct 14, 2012  1:26 PM       Ok to report. UA looks ok ------

## 2012-10-20 ENCOUNTER — Telehealth: Payer: Self-pay | Admitting: Cardiology

## 2012-10-20 DIAGNOSIS — I4891 Unspecified atrial fibrillation: Secondary | ICD-10-CM

## 2012-10-20 MED ORDER — METOPROLOL TARTRATE 50 MG PO TABS: 75.0000 mg | ORAL_TABLET | Freq: Two times a day (BID) | ORAL | Status: DC

## 2012-10-20 NOTE — Telephone Encounter (Signed)
New Prob    Pt states  [name] changed dosage on her metoprolol. She went to go get a refill but pharmacy states it isn't time. She is requesting a new prescription. Would like to speak to nurse.

## 2012-10-20 NOTE — Telephone Encounter (Signed)
I spoke with the pt and confirmed her pharmacy.  Rx sent to the pharmacy for Metoprolol Tartrate 50mg  take one and one-half tablet by mouth twice a day #90, refill x 6.

## 2012-11-03 ENCOUNTER — Ambulatory Visit (INDEPENDENT_AMBULATORY_CARE_PROVIDER_SITE_OTHER): Payer: Medicare Other | Admitting: *Deleted

## 2012-11-03 DIAGNOSIS — I4891 Unspecified atrial fibrillation: Secondary | ICD-10-CM | POA: Diagnosis not present

## 2012-11-03 DIAGNOSIS — Z7901 Long term (current) use of anticoagulants: Secondary | ICD-10-CM

## 2012-11-03 LAB — POCT INR: INR: 3.1

## 2012-11-10 DIAGNOSIS — H409 Unspecified glaucoma: Secondary | ICD-10-CM | POA: Diagnosis not present

## 2012-11-10 DIAGNOSIS — H4011X0 Primary open-angle glaucoma, stage unspecified: Secondary | ICD-10-CM | POA: Diagnosis not present

## 2012-11-14 ENCOUNTER — Other Ambulatory Visit: Payer: Self-pay | Admitting: Pulmonary Disease

## 2012-12-01 ENCOUNTER — Ambulatory Visit (INDEPENDENT_AMBULATORY_CARE_PROVIDER_SITE_OTHER): Payer: Medicare Other

## 2012-12-01 DIAGNOSIS — I4891 Unspecified atrial fibrillation: Secondary | ICD-10-CM

## 2012-12-01 DIAGNOSIS — Z7901 Long term (current) use of anticoagulants: Secondary | ICD-10-CM

## 2012-12-17 ENCOUNTER — Ambulatory Visit (INDEPENDENT_AMBULATORY_CARE_PROVIDER_SITE_OTHER): Payer: Medicare Other | Admitting: Pulmonary Disease

## 2012-12-17 ENCOUNTER — Encounter: Payer: Self-pay | Admitting: Pulmonary Disease

## 2012-12-17 VITALS — BP 116/70 | HR 114 | Temp 97.4°F | Ht 66.0 in | Wt 134.0 lb

## 2012-12-17 DIAGNOSIS — F411 Generalized anxiety disorder: Secondary | ICD-10-CM

## 2012-12-17 DIAGNOSIS — K5730 Diverticulosis of large intestine without perforation or abscess without bleeding: Secondary | ICD-10-CM

## 2012-12-17 DIAGNOSIS — I10 Essential (primary) hypertension: Secondary | ICD-10-CM | POA: Diagnosis not present

## 2012-12-17 DIAGNOSIS — M1990 Unspecified osteoarthritis, unspecified site: Secondary | ICD-10-CM

## 2012-12-17 DIAGNOSIS — I4891 Unspecified atrial fibrillation: Secondary | ICD-10-CM

## 2012-12-17 DIAGNOSIS — E7800 Pure hypercholesterolemia, unspecified: Secondary | ICD-10-CM

## 2012-12-17 DIAGNOSIS — M949 Disorder of cartilage, unspecified: Secondary | ICD-10-CM

## 2012-12-17 DIAGNOSIS — F17200 Nicotine dependence, unspecified, uncomplicated: Secondary | ICD-10-CM

## 2012-12-17 DIAGNOSIS — D126 Benign neoplasm of colon, unspecified: Secondary | ICD-10-CM

## 2012-12-17 NOTE — Patient Instructions (Addendum)
Today we updated your med list in our EPIC system...    Continue your current medications the same...  Stay as active as possible...  Call for any questions...  Let's plan a follow up visit in 6mo, sooner if needed for problems...   

## 2012-12-17 NOTE — Progress Notes (Signed)
Subjective:    Patient ID: [name], female    DOB: 04-28-1934, 79 y.o.   MRN: [id number]  HPI 79 y/o BF here for a follow up visit... she has HBP, Chol, DJD, and mult med problems as noted below...   ~  June 22, 2011:  160mo ROV & she has had a good interval, no new complaints or concerns; she brings in a Living Will for [place] to scan into the record; ok flu shot...    Cig smoker> still smokes ~1/2ppd & does not want smoking cessation counseling or help; denies cough, phlegm, hemoptysis, CP, SOB, etc...    HBP> on Metoprolol50, Hyzaar100; BP= 148/82 today & even better at home- reminded to reduce sodium, take meds daily; she denies CP, palpit, SOB, edema, etc.    CHOL> on Lipitor20; FLP looks good & all parameters are at goal...    GI> on Prilosec OTC prn, Miralax, Metamucil, Analpram Cream; she's had considerable difficulty w/ hemorrhoids but now says "it's not my hem's at all, it's my glands" w/ evals from DrDBrodie, DrToth ([facility]), and DrFuller at [facility]... Follow up colonoscopy 5/12 by DrBrodie showed severe diverticulosis & rectal prolapse...    DJD> on Ca++ MVI, VitD, Advil; she had right THR in 2005...    Anxiety> she has been on Zoloft & Rebeca Allegra in the past...  ~  December 17, 2011:  160mo ROV & [name] is concerned about her vision> continued f/u DrShapiro & DrRankin w/ more surg that she doesn't understand & we don't have any notes to review;  Still smoking 1/2ppd but denies much cough, sputum, no hemoptysis, SOB, etc> she is due for f/u CXR= stable scarring at the bases, DJD in spine, NAD;  BP controlled on her & Chol ok on her Lip20> see prob list below>> CXR 4/13 showed normal heart size, clear lungs w/ mild scarring at the bases, DJD in spine, NAD... LABS 4/13:  FLP- at goals on Lip20;  Chems- wnl;  CBC- wnl;  TSH=0.92;  VitD=31  ~  June 18, 2012:  160mo ROV & Tieasha developed AFib in the interval, seen by DrStuckey, tried Cardioversion but this was unsuccessful & pt prefers to  remain in AFib w/ rate control on Metoprolol & Coumadin anticoagulation...     Cig smoker> still smokes ~1/2ppd & does not want smoking cessation counseling or help; denies cough, phlegm, hemoptysis, CP, SOB, etc...    HBP> on Metoprolol50Bid, Hyzaar100/12.5, & K10; BP= 138/90 today & even better at home- reminded to reduce sodium, take meds daily; BP sl elev today but she does NOT want to inc meds!    AFib> on above + Coumadin via the CC; unsuccessful cardioversion attempt 8/13; good rate control, she is stable & denies CP, palpit, SOB, edema...    CHOL> on Lipitor20; FLP looks good & all parameters are at goal, she is not fasting today...    GI> on Prilosec OTC prn, Miralax, Metamucil, Analpram Cream; she's had considerable difficulty w/ hemorrhoids but now says "it's not my hem's at all, it's my glands" w/ evals from DrDBrodie, DrToth ([facility]), and DrFuller at [facility]... Follow up colonoscopy 5/12 by DrBrodie showed severe diverticulosis & rectal prolapse...    DJD, osteopenia> on Ca++ MVI, VitD, Advil; she had right THR in 2005...    Anxiety> she has been on Zoloft & Alpraz in the past... We reviewed prob list, meds, xrays and labs> see below for updates >> OK Flu shot today... LABS 8/13:  Chems- wnl;  CBC- wnl;  Protimes per [facility]... EKG 9/13 showed AFib, rate 78; NSSTTWA...    ~  December 17, 2012:  3mo ROV & Alyanah states that she's doing well- no new complaints or concerns x "just asthma";  We reviewed the following medical problems during today's office visit >>     Cig smoker> still smokes 4-5cig/d & does not want smoking cessation counseling or help; she notes "just asthma" but denies cough, sput, hemoptysis, SOB, etc; advised to quit all smoking!    HBP> on Metoprolol50-1.5Bid, Hyzaar100/12.5, & K10; BP= 116/70 today- reminded to reduce sodium, take meds daily; she denies CP, palpit, SOB, edema...    AFib> on above + Coumadin via the CC; unsuccessful cardioversion attempt 8/13; good rate  control, she is stable & denies CP, palpit, SOB, edema...    CHOL> on Lipitor20; FLP looked good 4/13 & all parameters were at goal, she is not fasting today...    GI> on Prilosec OTC prn, Miralax, Metamucil, Analpram Cream; she's had considerable difficulty w/ hemorrhoids but now says "it's not my hem's at all, it's my glands" w/ evals from DrDBrodie, DrToth ([facility]), and DrFuller at [facility]... Follow up colonoscopy 5/12 by DrBrodie showed severe diverticulosis & rectal prolapse...    DJD, osteopenia> on Ca++ MVI, VitD, Advil; she had right THR in 2005...    Anxiety> she has been on Zoloft & Alpraz in the past... We reviewed prob list, meds, xrays and labs> see below for updates >>  LABS 1/14:  Chems- wnl;  CBC- wnl...          Problem List:      FATIGUE (ICD-780.79) - she rests OK, wakes tired most days, no daytime hypersomnolence...  GLAUCOMA (ICD-365.9) - on eye drops per ophthalmology;  Had bilat cat surg by DrShapiro, then required retinal surg by DrRankin (we don't have records) & she describes on-going prob w/ her vision that has her concerned...  CIGARETTE SMOKER (ICD-305.1) - still smokes ~1/4ppd & must quit, she knows the risks & not interested in smoking cessation help; she is reminded at each & every OV how important it is to quit smoking!!! She refuses smoking cessation help... ~  CXR 10/07 showed sl hyperinflation, mild incr Cor... ~  CXR in ER 10/09 showed COPD, bibasilar scarring, NAD.Marland Kitchen. ~  CXR 10/10 showed similar findings- scarring, NAD.Marland Kitchen. ~  CXR 10/11 showed COPD/ Emphysema, bibasilar scarring, DJD sp, NAD. ~  CXR 4/13 showed normal heart size, clear lungs w/ mild scarring at the bases, DJD in spine, NAD...  HYPERTENSION (ICD-401.9) - controlled on ASA 81mg /d, METOPROLOL 50mg /d & LOSARTAN/HCT 100-12.5 daily..  ~  10/11:  BP= 134/76 & she tells me that she sometimes takes these meds Qod and that she feels better on this schedule & wishes to continue this way... ~  4/12:  BP=  140/70 & she denies HA, CP, palpit, SOB, edema, etc... ~  10/12:  BP= 148/82 & she remains asymptomatic... ~  4/13:  BP= 140/82 & she denies HAs, CP, palpit, dizzy, SOB, edema... ~  10/13:  BP= 138/90 & she denies symptoms and does not want to incr meds at this time, she has f/u DrStuckey soon... ~  4/14:  on Metoprolol50-1.5Bid, Hyzaar100/12.5, & K10; BP= 116/70 today- reminded to reduce sodium, take meds daily; she denies CP, palpit, SOB, edema.  HYPERCHOLESTEROLEMIA (ICD-272.0) - on LIPITOR 20mg /d... ~  FLP 4/09 showed TChol 181, TG 73, HDL 54, LDL 112 ~  FLP 4/10 showed TChol 168, TG  63, HDL 53, LDL 102 ~  FLP 4/11 on Lip20 showed TChol 169, TG 64, HDL 57, LDL 100 ~  FLP 4/12 on Lip20 showed TChol 163, TG 52, HDL 70, LDL 83... Continue same. ~  FLP 4/13 on Lip20 showed TChol 170, TG 46, HDL 79, LDL 82  DYSPEPSIA (ICD-536.8) - uses PRILOSEC OTC as needed...  DIVERTICULOSIS OF COLON (ICD-562.10) - she alternates MIRALAX & METAMUCIL Qod... COLONIC POLYPS (ICD-211.3)  Family Hx of COLON CANCER (ICD-153.9) HEMORRHOIDS (ICD-455.6) - "Hemorrhoids are my biggest problem" > she will f/u GI- DrDBrodie... OTHER SPECIFIED DISORDER OF RECTUM AND ANUS (ICD-569.49) -  - eval by DrToth & referred to [facility] DrFuller for painful defecation... he recommended muscle retraining but initially her insurance wouldn't cover this, later they did cover & she had the "muscle therapy" now doing home therapy... ~  Colonoscopy by DrBrodie 6/07 showed divertics, hems... ~  Colonoscopy 5/12 showed severe diverticulosis & rectal prolapse...  RENAL CYST (ICD-593.2) - Abd Sonar 11/09 showed sm bilat renal cysts...  DEGENERATIVE JOINT DISEASE (ICD-715.90) - s/p right THR 3/05 by DrBeane... she also has a known lumbar spondylosis w/ right leg radiculopathy... she states that exercise is helping... ~  10/10: c/o knee pain- she declines Rheum/ Ortho referral... continue OTC meds (Advil) Prn...  OSTEOPENIA (ICD-733.90) &  VITAMIN D DEFICIENCY (ICD-268.9) -  ~  labs 4/10 showed Vit D level = 23 & started on Vit D 1000 u OTC daily... ~  BMD 5/11 showed TScores -1.2 in Spine, and -1.6 in [facility]... on Calcium, MVI, Vit D. ~  labs 5/11 showed Vit D level = 41... continue 1000 u daily. ~  Labs 4/13 showed Vit D level = 31  EPISODE of CONFUSION >> 1/14 went to ER w/ confusion, found to have UTI & treated; CTBrain showed mild atrophy, otherw NAD... Hx of RESTLESS LEGS SYNDROME (ICD-333.94) - she tried Requip 1mg  Qhs in the past> now off...  ANXIETY (ICD-300.00) - she states that she stopped the Alprazolam (prev 0.5mg  Prn)... ~  10/09 states depressed- 2 sisters died 2023/02/02- one w/ ca pancreas, other w/ DM/ ASPVD.Marland Kitchen. tried Zofoft transiently then stopped.  HEALTH MAINTENANCE:  she sees DrMezer et al for GYN, neg mammogram 3/12... she gets the yearly seasonal Flu vaccines, and had the PNEUMOVAX in 2005 at age 30...   Past Surgical History  Procedure Laterality Date  . Total hip arthroplasty  2/05    right by Dr. Shelle Iron  . Cataract extraction    . Upper gastrointestinal endoscopy    . Colonoscopy    . Retina surgery x 2  2013  . Cardioversion  04/23/2012    Procedure: CARDIOVERSION;  Surgeon: Herby Abraham, MD;  Location: [facility] ENDOSCOPY;  Service: Cardiovascular;  Laterality: N/A;    Outpatient Encounter Prescriptions as of 12/17/2012  Medication Sig Dispense Refill  . atorvastatin (LIPITOR) 20 MG tablet TAKE 1 TABLET BY MOUTH ONCE DAILY  30 tablet  5  . calcium-vitamin D (OSCAL WITH D) 500-200 MG-UNIT per tablet Take 1 tablet by mouth daily.        . Cholecalciferol (VITAMIN D3) 1000 UNITS CAPS Take 1 capsule by mouth daily.        Marland Kitchen ibuprofen (ADVIL) 200 MG tablet as needed.        . latanoprost (XALATAN) 0.005 % ophthalmic solution Place 1 drop into both eyes at bedtime.       Marland Kitchen levobunolol (BETAGAN) 0.5 % ophthalmic solution Place 1 drop into both eyes  2 (two) times daily.       Marland Kitchen losartan-hydrochlorothiazide  (HYZAAR) 100-12.5 MG per tablet take 1 tablet by mouth once daily  30 tablet  4  . metoprolol (LOPRESSOR) 50 MG tablet Take 1.5 tablets (75 mg total) by mouth 2 (two) times daily.  90 tablet  6  . Multiple Vitamin (MULTIVITAMIN) tablet Take 1 tablet by mouth daily.        . polyethylene glycol powder (MIRALAX) powder Take 17 g by mouth as needed.       . potassium chloride (K-DUR) 10 MEQ tablet Take 1 tablet (10 mEq total) by mouth daily.  30 tablet  6  . travoprost, benzalkonium, (TRAVATAN) 0.004 % ophthalmic solution Place 1 drop into the left eye 3 (three) times daily.       Marland Kitchen warfarin (COUMADIN) 2.5 MG tablet Take as directed by coumadin clinic  90 tablet  3  . [DISCONTINUED] pilocarpine (PILOCAR) 1 % ophthalmic solution Place 1 drop into the left eye 3 (three) times daily.       No facility-administered encounter medications on file as of 12/17/2012.    No Known Allergies   Current Medications, Allergies, Past Medical History, Past Surgical History, Family History, and Social History were reviewed in [employer] record.    Review of Systems       See HPI - all other systems neg except as noted... The patient complains of dyspnea on exertion.  The patient denies anorexia, fever, weight loss, weight gain, vision loss, decreased hearing, hoarseness, chest pain, syncope, peripheral edema, prolonged cough, headaches, hemoptysis, abdominal pain, melena, hematochezia, severe indigestion/heartburn, hematuria, incontinence, muscle weakness, suspicious skin lesions, transient blindness, difficulty walking, depression, unusual weight change, abnormal bleeding, enlarged lymph nodes, and angioedema.     Objective:   Physical Exam     WD, WN, 79 y/o BF in NAD... GENERAL:  Alert & oriented; pleasant & cooperative. HEENT:  [place]/AT, EOM-wnl, PERRLA, EACs-clear, TMs-wnl, NOSE-clear, THROAT-clear & wnl. NECK:  Supple w/ fairROM; no JVD; normal carotid impulses w/o bruits; no  thyromegaly or nodules palpated; no lymphadenopathy. CHEST:  Clear to P & A; without wheezes/ rales/ or rhonchi. HEART:  Regular Rhythm;  gr 1/6 SEM without rubs or gallops heard... ABDOMEN:  Soft & nontender; normal bowel sounds; no organomegaly or masses detected. EXT: without deformities, mod arthritic changes; no varicose veins/ +venous insuffic/ tr edema. NEURO:  CN's intact;  no focal neuro deficits... DERM:  No lesions noted; no rash etc...  RADIOLOGY DATA:  Reviewed in the EPIC EMR & discussed w/ the patient...  LABORATORY DATA:  Reviewed in the EPIC EMR & discussed w/ the patient...   Assessment & Plan:            Visual problems>  Followed by DrShapiro & DrRankin; encouraged to ask questions so she can better understand what is going on...  SMOKER>  We again reviewed the importance of smoking cessation, she declines smoking cessation help...  HBP>  Controlled on BBlocker, ARB, Diuretic, Potassium;  Continue same + no salt etc...  AFIB>  New prob 6/13, she is followed by DrStuckey & had cardioversion attempt 8/13, unsuccessful & she has opted for rate control & Coumadin...  CHOL>  She remains stable on the Lip20;  Continue same + diet/ exercise...  DYSPEPSIA>  Continue Prilosec OTC Prn...  HEM's>  She has followed up w/ DrDBrodie & had colonoscopy 5/12...  DJD/ Osteopenia>  As noted right THR 2005, known LBP/ spondylosis; c/o  knee pain on & off & uses OTC analgesics prn...  Anxiety> she declines anxiolytic Rx...   Patient's Medications  New Prescriptions   No medications on file  Previous Medications   ATORVASTATIN (LIPITOR) 20 MG TABLET    TAKE 1 TABLET BY MOUTH ONCE DAILY   CALCIUM-VITAMIN D (OSCAL WITH D) 500-200 MG-UNIT PER TABLET    Take 1 tablet by mouth daily.     CHOLECALCIFEROL (VITAMIN D3) 1000 UNITS CAPS    Take 1 capsule by mouth daily.     IBUPROFEN (ADVIL) 200 MG TABLET    as needed.     LATANOPROST (XALATAN) 0.005 % OPHTHALMIC SOLUTION     Place 1 drop into both eyes at bedtime.    LEVOBUNOLOL (BETAGAN) 0.5 % OPHTHALMIC SOLUTION    Place 1 drop into both eyes 2 (two) times daily.    LOSARTAN-HYDROCHLOROTHIAZIDE (HYZAAR) 100-12.5 MG PER TABLET    take 1 tablet by mouth once daily   METOPROLOL (LOPRESSOR) 50 MG TABLET    Take 1.5 tablets (75 mg total) by mouth 2 (two) times daily.   MULTIPLE VITAMIN (MULTIVITAMIN) TABLET    Take 1 tablet by mouth daily.     POLYETHYLENE GLYCOL POWDER (MIRALAX) POWDER    Take 17 g by mouth as needed.    POTASSIUM CHLORIDE (K-DUR) 10 MEQ TABLET    Take 1 tablet (10 mEq total) by mouth daily.   TRAVOPROST, BENZALKONIUM, (TRAVATAN) 0.004 % OPHTHALMIC SOLUTION    Place 1 drop into the left eye 3 (three) times daily.    WARFARIN (COUMADIN) 2.5 MG TABLET    Take as directed by coumadin clinic  Modified Medications   No medications on file  Discontinued Medications   PILOCARPINE (PILOCAR) 1 % OPHTHALMIC SOLUTION    Place 1 drop into the left eye 3 (three) times daily.

## 2012-12-29 ENCOUNTER — Ambulatory Visit (INDEPENDENT_AMBULATORY_CARE_PROVIDER_SITE_OTHER): Payer: Medicare Other | Admitting: Pharmacist

## 2012-12-29 DIAGNOSIS — Z7901 Long term (current) use of anticoagulants: Secondary | ICD-10-CM | POA: Diagnosis not present

## 2012-12-29 DIAGNOSIS — I4891 Unspecified atrial fibrillation: Secondary | ICD-10-CM | POA: Diagnosis not present

## 2012-12-29 LAB — POCT INR: INR: 2.7

## 2013-01-15 ENCOUNTER — Other Ambulatory Visit: Payer: Self-pay | Admitting: *Deleted

## 2013-01-15 DIAGNOSIS — E876 Hypokalemia: Secondary | ICD-10-CM

## 2013-01-15 MED ORDER — POTASSIUM CHLORIDE ER 10 MEQ PO TBCR: 10.0000 meq | EXTENDED_RELEASE_TABLET | Freq: Every day | ORAL | Status: DC

## 2013-01-15 NOTE — Telephone Encounter (Signed)
Fax Received. Refill Completed. [name] (R.M.A)   

## 2013-01-27 ENCOUNTER — Ambulatory Visit (INDEPENDENT_AMBULATORY_CARE_PROVIDER_SITE_OTHER): Payer: Medicare Other | Admitting: Physician Assistant

## 2013-01-27 ENCOUNTER — Ambulatory Visit (INDEPENDENT_AMBULATORY_CARE_PROVIDER_SITE_OTHER): Payer: Medicare Other

## 2013-01-27 ENCOUNTER — Encounter: Payer: Self-pay | Admitting: Physician Assistant

## 2013-01-27 VITALS — BP 128/82 | HR 93 | Ht 66.0 in | Wt 133.4 lb

## 2013-01-27 DIAGNOSIS — I10 Essential (primary) hypertension: Secondary | ICD-10-CM

## 2013-01-27 DIAGNOSIS — E7800 Pure hypercholesterolemia, unspecified: Secondary | ICD-10-CM

## 2013-01-27 DIAGNOSIS — Z7901 Long term (current) use of anticoagulants: Secondary | ICD-10-CM

## 2013-01-27 DIAGNOSIS — F17200 Nicotine dependence, unspecified, uncomplicated: Secondary | ICD-10-CM

## 2013-01-27 DIAGNOSIS — I4891 Unspecified atrial fibrillation: Secondary | ICD-10-CM

## 2013-01-27 LAB — POCT INR: INR: 2.6

## 2013-01-27 MED ORDER — LOSARTAN POTASSIUM-HCTZ 50-12.5 MG PO TABS: 1.0000 | ORAL_TABLET | Freq: Every day | ORAL | Status: DC

## 2013-01-27 NOTE — Patient Instructions (Addendum)
PER SCOTT WEAVER, PAC YOU ARE TO FINISH THE CURRENT BOTTLE OF HYZAAR; ONCE YOU FINISH THIS BOTTLE YOU WILL START HYZAAR 50/12.5 MG 1 TABLET DAILY  WHEN YOU DO START THE NEW DOSE OF HYZAAR 50/12.5 MG YOU WILL ALSO INCREASE YOUR METOPROLOL TO 100 MG TWICE DAILY.    PLEASE FOLLOW UP WITH DR. CRENSHAW IN 3 MONTHS ON 05/18/13  @ 9 AM

## 2013-01-27 NOTE — Progress Notes (Signed)
[address], [address] [place], [place]  [address] Phone: [phone] Fax:  [phone]  Date:  01/27/2013   ID:  [name], DOB [birth year], MRN [id number]  PCP:  Karen Mcalpine, MD  Cardiologist:  Dr.  Shawnie Pons => Dr. Olga Carlson     History of Present Illness: [name] is a 79 y.o. female who returns for f/u.  She has a hx of permanent AFib s/p failed DCCV in 8/13, HTN, HL, tobacco abuse.  Echo 01/2012: EF 55-65%, normal wall motion, grade 2 diastolic dysfunction, mild MR.  Last seen by Karen Fredrickson, NP 09/2012.  Since last seen, she notes increased heart rates with ADLs at home.  She sometimes notes dizziness.  No syncope.  No dyspnea.  No orthopnea, PND, edema.  No chest pain.   Labs (4/13):  LDL 82 Labs (6/13):  TSH 0.94 Labs (1/14):  K 4.1, Cr 0.83, ALT 41, Hgb 14.2  Wt Readings from Last 3 Encounters:  01/27/13 133 lb 6.4 oz (60.51 kg)  12/17/12 134 lb (60.782 kg)  10/14/12 138 lb 6.4 oz (62.778 kg)     Past Medical History  Diagnosis Date  . Hypertension   . Hyperlipidemia   . Glaucoma   . Cigarette smoker   . Dyspepsia   . Diverticulosis of colon   . Colonic polyp   . Family history of colon cancer   . Hemorrhoids   . Other specified disorder of rectum and anus   . Renal cyst   . DJD (degenerative joint disease)   . Osteopenia   . Vitamin D deficiency   . Restless leg syndrome   . Anxiety   . Depression   . Dysrhythmia     A-fib    Current Outpatient Prescriptions  Medication Sig Dispense Refill  . atorvastatin (LIPITOR) 20 MG tablet TAKE 1 TABLET BY MOUTH ONCE DAILY  30 tablet  5  . calcium-vitamin D (OSCAL WITH D) 500-200 MG-UNIT per tablet Take 1 tablet by mouth daily.        . Cholecalciferol (VITAMIN D3) 1000 UNITS CAPS Take 1 capsule by mouth daily.        Marland Kitchen ibuprofen (ADVIL) 200 MG tablet as needed.        . latanoprost (XALATAN) 0.005 % ophthalmic solution Place 1 drop into both eyes at bedtime.       Marland Kitchen  levobunolol (BETAGAN) 0.5 % ophthalmic solution Place 1 drop into both eyes 2 (two) times daily.       Marland Kitchen losartan-hydrochlorothiazide (HYZAAR) 100-12.5 MG per tablet take 1 tablet by mouth once daily  30 tablet  4  . metoprolol (LOPRESSOR) 50 MG tablet Take 1.5 tablets (75 mg total) by mouth 2 (two) times daily.  90 tablet  6  . Multiple Vitamin (MULTIVITAMIN) tablet Take 1 tablet by mouth daily.        . polyethylene glycol powder (MIRALAX) powder Take 17 g by mouth as needed.       . potassium chloride (K-DUR) 10 MEQ tablet Take 1 tablet (10 mEq total) by mouth daily.  30 tablet  6  . travoprost, benzalkonium, (TRAVATAN) 0.004 % ophthalmic solution Place 1 drop into the left eye 3 (three) times daily.       Marland Kitchen warfarin (COUMADIN) 2.5 MG tablet Take as directed by coumadin clinic  90 tablet  3   No current facility-administered medications for this visit.    Allergies:   No Known Allergies  Social  History:  The patient  reports that she has been smoking.  She has never used smokeless tobacco. She reports that she does not drink alcohol or use illicit drugs.   ROS:  Please see the history of present illness.   All other systems reviewed and negative.   PHYSICAL EXAM: VS:  BP 128/82  Pulse 93  Ht 5\' 6"  (1.676 m)  Wt 133 lb 6.4 oz (60.51 kg)  BMI 21.54 kg/m2 Well nourished, well developed, in no acute distress HEENT: normal Neck: no JVD Cardiac:  normal S1, S2; irregularly irregular rhythm; no murmur Lungs:  clear to auscultation bilaterally, no wheezing, rhonchi or rales Abd: soft, nontender, no hepatomegaly Ext: no edema Skin: warm and dry Neuro:  CNs 2-12 intact, no focal abnormalities noted  EKG:  AFib, HR 93     ASSESSMENT AND PLAN:  1. Atrial Fibrillation:  I suspect she is still symptomatic with her AFib.  She would likely feel better with a lower HR.  Currently, HR in the 90s.  I will increase metoprolol to 100 mg BID.  If she continues to be symptomatic, may need to  consider anti-arrhythmic drug Rx and repeat DCCV.  She has had therapeutic INRs for several mos dating back to December.  With her age, would probably consider amiodarone first.   2. Hypertension:  I will have her reduce her Hyzaar to 50/12.5 mg QD since I am increasing her metoprolol. 3. Hyperlipidemia:  Continue statin. 4. DJD:  I completed her handicapped placard today. 5. Tobacco Abuse:  She understands that she needs to quit.  6. Disposition:  I will have her establish with Dr. Olga Carlson for her next visit.  F/u in 3 mos.   Signed, Tereso Newcomer, PA-C  01/27/2013 9:56 AM

## 2013-02-09 DIAGNOSIS — H4011X0 Primary open-angle glaucoma, stage unspecified: Secondary | ICD-10-CM | POA: Diagnosis not present

## 2013-02-09 DIAGNOSIS — H409 Unspecified glaucoma: Secondary | ICD-10-CM | POA: Diagnosis not present

## 2013-02-10 ENCOUNTER — Ambulatory Visit: Payer: Medicare Other | Admitting: Physician Assistant

## 2013-02-10 ENCOUNTER — Ambulatory Visit: Payer: Medicare Other | Admitting: Nurse Practitioner

## 2013-02-25 ENCOUNTER — Other Ambulatory Visit: Payer: Self-pay | Admitting: *Deleted

## 2013-02-25 MED ORDER — WARFARIN SODIUM 2.5 MG PO TABS: ORAL_TABLET | ORAL | Status: DC

## 2013-03-09 ENCOUNTER — Ambulatory Visit (INDEPENDENT_AMBULATORY_CARE_PROVIDER_SITE_OTHER): Payer: Medicare Other | Admitting: *Deleted

## 2013-03-09 DIAGNOSIS — Z7901 Long term (current) use of anticoagulants: Secondary | ICD-10-CM | POA: Diagnosis not present

## 2013-03-09 DIAGNOSIS — I4891 Unspecified atrial fibrillation: Secondary | ICD-10-CM

## 2013-03-09 LAB — POCT INR: INR: 2.8

## 2013-03-14 ENCOUNTER — Other Ambulatory Visit: Payer: Self-pay | Admitting: Pulmonary Disease

## 2013-03-16 ENCOUNTER — Telehealth: Payer: Self-pay | Admitting: Cardiology

## 2013-03-16 DIAGNOSIS — I4891 Unspecified atrial fibrillation: Secondary | ICD-10-CM

## 2013-03-16 MED ORDER — METOPROLOL TARTRATE 100 MG PO TABS: 100.0000 mg | ORAL_TABLET | Freq: Two times a day (BID) | ORAL | Status: DC

## 2013-03-16 NOTE — Telephone Encounter (Signed)
Spoke with pt, her metoprolol was increased at the office visit with scott weaver to 100 mg bid. New script sent to [facility]

## 2013-03-16 NOTE — Telephone Encounter (Signed)
New Prob     Requesting a new prescription of METOPROLOL to [facility] on [address].

## 2013-04-20 ENCOUNTER — Ambulatory Visit (INDEPENDENT_AMBULATORY_CARE_PROVIDER_SITE_OTHER): Payer: Medicare Other | Admitting: *Deleted

## 2013-04-20 DIAGNOSIS — I4891 Unspecified atrial fibrillation: Secondary | ICD-10-CM | POA: Diagnosis not present

## 2013-04-20 DIAGNOSIS — Z7901 Long term (current) use of anticoagulants: Secondary | ICD-10-CM | POA: Diagnosis not present

## 2013-05-11 DIAGNOSIS — Z961 Presence of intraocular lens: Secondary | ICD-10-CM | POA: Diagnosis not present

## 2013-05-11 DIAGNOSIS — H4011X0 Primary open-angle glaucoma, stage unspecified: Secondary | ICD-10-CM | POA: Diagnosis not present

## 2013-05-11 DIAGNOSIS — H409 Unspecified glaucoma: Secondary | ICD-10-CM | POA: Diagnosis not present

## 2013-05-18 ENCOUNTER — Ambulatory Visit (INDEPENDENT_AMBULATORY_CARE_PROVIDER_SITE_OTHER): Payer: Medicare Other | Admitting: Cardiology

## 2013-05-18 ENCOUNTER — Encounter: Payer: Self-pay | Admitting: Cardiology

## 2013-05-18 ENCOUNTER — Ambulatory Visit (INDEPENDENT_AMBULATORY_CARE_PROVIDER_SITE_OTHER): Payer: Medicare Other | Admitting: *Deleted

## 2013-05-18 VITALS — BP 130/80 | HR 79 | Ht 66.5 in | Wt 131.1 lb

## 2013-05-18 DIAGNOSIS — Z7901 Long term (current) use of anticoagulants: Secondary | ICD-10-CM | POA: Diagnosis not present

## 2013-05-18 DIAGNOSIS — I10 Essential (primary) hypertension: Secondary | ICD-10-CM

## 2013-05-18 DIAGNOSIS — E7800 Pure hypercholesterolemia, unspecified: Secondary | ICD-10-CM | POA: Diagnosis not present

## 2013-05-18 DIAGNOSIS — I4891 Unspecified atrial fibrillation: Secondary | ICD-10-CM | POA: Diagnosis not present

## 2013-05-18 DIAGNOSIS — F17200 Nicotine dependence, unspecified, uncomplicated: Secondary | ICD-10-CM | POA: Diagnosis not present

## 2013-05-18 NOTE — Patient Instructions (Addendum)
Your physician wants you to follow-up in: 6 MONTHS WITH DR CRENSHAW You will receive a reminder letter in the mail two months in advance. If you don't receive a letter, please call our office to schedule the follow-up appointment.   Your physician has recommended that you wear a 24 HOUR holter monitor. Holter monitors are medical devices that record the heart's electrical activity. Doctors most often use these monitors to diagnose arrhythmias. Arrhythmias are problems with the speed or rhythm of the heartbeat. The monitor is a small, portable device. You can wear one while you do your normal daily activities. This is usually used to diagnose what is causing palpitations/syncope (passing out).   

## 2013-05-18 NOTE — Progress Notes (Signed)
[name] is a 79 y.o. Female previously followed by Dr Riley Kill who returns for f/u of atrial fibrillation. She has a hx of s/p failed DCCV in 8/13, HTN, HL, tobacco abuse. Echo 01/2012: EF 55-65%, normal wall motion, grade 2 diastolic dysfunction, mild MR. Last seen by Tereso Newcomer , NP 09/2012. Since last seen, the patient denies any dyspnea on exertion, orthopnea, PND, pedal edema, palpitations, syncope or chest pain.    Current Outpatient Prescriptions  Medication Sig Dispense Refill  . atorvastatin (LIPITOR) 20 MG tablet take 1 tablet by mouth once daily  30 tablet  5  . calcium-vitamin D (OSCAL WITH D) 500-200 MG-UNIT per tablet Take 1 tablet by mouth daily.        . Cholecalciferol (VITAMIN D3) 1000 UNITS CAPS Take 1 capsule by mouth daily.        Marland Kitchen ibuprofen (ADVIL) 200 MG tablet as needed.        . latanoprost (XALATAN) 0.005 % ophthalmic solution Place 1 drop into both eyes at bedtime.       Marland Kitchen levobunolol (BETAGAN) 0.5 % ophthalmic solution Place 1 drop into both eyes 2 (two) times daily.       Marland Kitchen losartan-hydrochlorothiazide (HYZAAR) 50-12.5 MG per tablet Take 1 tablet by mouth daily. DO NOT START UNTIL YOU FINISH CURRENT BOTTLE OF HYZAAR  30 tablet  11  . metoprolol (LOPRESSOR) 100 MG tablet Take 1 tablet (100 mg total) by mouth 2 (two) times daily.  60 tablet  12  . Multiple Vitamin (MULTIVITAMIN) tablet Take 1 tablet by mouth daily.        . polyethylene glycol powder (MIRALAX) powder Take 17 g by mouth as needed.       . potassium chloride (K-DUR) 10 MEQ tablet Take 1 tablet (10 mEq total) by mouth daily.  30 tablet  6  . travoprost, benzalkonium, (TRAVATAN) 0.004 % ophthalmic solution Place 1 drop into the left eye 3 (three) times daily.       Marland Kitchen warfarin (COUMADIN) 2.5 MG tablet Take as directed by coumadin clinic  90 tablet  3   No current facility-administered medications for this visit.     Past Medical History  Diagnosis Date  . Hypertension   . Hyperlipidemia     . Glaucoma   . Cigarette smoker   . Dyspepsia   . Diverticulosis of colon   . Colonic polyp   . Family history of colon cancer   . Hemorrhoids   . Other specified disorder of rectum and anus   . Renal cyst   . DJD (degenerative joint disease)   . Osteopenia   . Vitamin D deficiency   . Restless leg syndrome   . Anxiety   . Depression   . Dysrhythmia     A-fib    Past Surgical History  Procedure Laterality Date  . Total hip arthroplasty  2/05    right by Dr. Shelle Iron  . Cataract extraction    . Upper gastrointestinal endoscopy    . Colonoscopy    . Retina surgery x 2  2013  . Cardioversion  04/23/2012    Procedure: CARDIOVERSION;  Surgeon: Herby Abraham, MD;  Location: [facility] END[facility]COPY;  Service: Cardiovascular;  Laterality: N/A;    History   Social History  . Marital Status: Widowed    Spouse Name: N/A    Number of Children: 0  . Years of Education: N/A   Occupational History  . Retired    Social History  Main Topics  . Smoking status: Light Tobacco Smoker -- 0.30 packs/day  . Smokeless tobacco: Never Used     Comment: 6 cig a day  . Alcohol Use: No  . Drug Use: No  . Sexual Activity: Not Currently   Other Topics Concern  . Not on file   Social History Narrative   Exercises 2 times per week   Caffeine use: 1 cup coffee per day    ROS: no fevers or chills, productive cough, hemoptysis, dysphasia, odynophagia, melena, hematochezia, dysuria, hematuria, rash, seizure activity, orthopnea, PND, pedal edema, claudication. Remaining systems are negative.  Physical Exam: Well-developed well-nourished in no acute distress.  Skin is warm and dry.  HEENT is normal.  Neck is supple.  Chest is clear to auscultation with normal expansion.  Cardiovascular exam is irregular Abdominal exam nontender or distended. No masses palpated. Extremities show no edema. neuro grossly intact  ECG atrial fibrillation at a rate of 79. Cannot rule out prior septal infarct.  Nonspecific ST changes.

## 2013-05-18 NOTE — Assessment & Plan Note (Signed)
Blood pressure controlled. Continue present medications. 

## 2013-05-18 NOTE — Assessment & Plan Note (Signed)
Patient remains in permanent atrial fibrillation. Continue metoprolol for rate control. She notes some palpitations with exertion. I will check a 24-hour Holter monitor to make sure that her rate is inadequately controlled. Embolic risk factors of hypertension and age greater than 71. Continue Coumadin with goal INR 2-3. We discussed NOAC agents and she declined.

## 2013-05-18 NOTE — Assessment & Plan Note (Signed)
Continue statin. 

## 2013-05-18 NOTE — Assessment & Plan Note (Signed)
Patient counseled on discontinuing. 

## 2013-06-08 ENCOUNTER — Encounter: Payer: Self-pay | Admitting: *Deleted

## 2013-06-08 ENCOUNTER — Encounter (INDEPENDENT_AMBULATORY_CARE_PROVIDER_SITE_OTHER): Payer: Medicare Other

## 2013-06-08 ENCOUNTER — Ambulatory Visit (INDEPENDENT_AMBULATORY_CARE_PROVIDER_SITE_OTHER): Payer: Medicare Other | Admitting: *Deleted

## 2013-06-08 DIAGNOSIS — I4891 Unspecified atrial fibrillation: Secondary | ICD-10-CM

## 2013-06-08 DIAGNOSIS — Z7901 Long term (current) use of anticoagulants: Secondary | ICD-10-CM | POA: Diagnosis not present

## 2013-06-08 LAB — POCT INR: INR: 2.8

## 2013-06-08 MED ORDER — WARFARIN SODIUM 2.5 MG PO TABS: ORAL_TABLET | ORAL | Status: DC

## 2013-06-08 NOTE — Progress Notes (Signed)
Patient ID: [name], female   DOB: 1933/10/19, 79 y.o.   MRN: [id number] E-[facility] 24 hour holter monitor applied to patient.

## 2013-06-15 ENCOUNTER — Telehealth: Payer: Self-pay | Admitting: *Deleted

## 2013-06-15 NOTE — Telephone Encounter (Signed)
Spoke with pt, aware monitor reviewed by dr Jens Som shows atrial fib, rate controlled.

## 2013-06-18 ENCOUNTER — Encounter: Payer: Self-pay | Admitting: Pulmonary Disease

## 2013-06-18 ENCOUNTER — Ambulatory Visit (INDEPENDENT_AMBULATORY_CARE_PROVIDER_SITE_OTHER): Payer: Medicare Other | Admitting: Pulmonary Disease

## 2013-06-18 VITALS — BP 112/72 | HR 97 | Temp 97.8°F | Ht 66.0 in | Wt 136.0 lb

## 2013-06-18 DIAGNOSIS — E7800 Pure hypercholesterolemia, unspecified: Secondary | ICD-10-CM

## 2013-06-18 DIAGNOSIS — M899 Disorder of bone, unspecified: Secondary | ICD-10-CM

## 2013-06-18 DIAGNOSIS — Z23 Encounter for immunization: Secondary | ICD-10-CM | POA: Diagnosis not present

## 2013-06-18 DIAGNOSIS — E559 Vitamin D deficiency, unspecified: Secondary | ICD-10-CM

## 2013-06-18 DIAGNOSIS — I10 Essential (primary) hypertension: Secondary | ICD-10-CM | POA: Diagnosis not present

## 2013-06-18 DIAGNOSIS — F411 Generalized anxiety disorder: Secondary | ICD-10-CM

## 2013-06-18 DIAGNOSIS — F17200 Nicotine dependence, unspecified, uncomplicated: Secondary | ICD-10-CM | POA: Diagnosis not present

## 2013-06-18 DIAGNOSIS — I4891 Unspecified atrial fibrillation: Secondary | ICD-10-CM | POA: Diagnosis not present

## 2013-06-18 DIAGNOSIS — K5730 Diverticulosis of large intestine without perforation or abscess without bleeding: Secondary | ICD-10-CM

## 2013-06-18 DIAGNOSIS — D126 Benign neoplasm of colon, unspecified: Secondary | ICD-10-CM

## 2013-06-18 DIAGNOSIS — M1990 Unspecified osteoarthritis, unspecified site: Secondary | ICD-10-CM

## 2013-06-18 NOTE — Patient Instructions (Signed)
Today we updated your med list in our EPIC system...    Continue your current medications the same...  Try the OTC GLYCERIN Suppositories to help your evacuation...  We gave you the 2014 Flu vaccine today...  Please return to our lab one morning soon for your FASTING blood work...    We will contact you w/ the results when available...   Call for any questions...  Let's plan a follow up visit in 70mo, sooner if needed for problems.Marland KitchenMarland Kitchen

## 2013-06-18 NOTE — Progress Notes (Signed)
Subjective:    Patient ID: [name], female    DOB: Feb 24, 1934, 79 y.o.   MRN: [id number]  HPI 79 y/o [name] here for a follow up visit... she has HBP, Chol, DJD, and mult med problems as noted below...   ~  December 17, 2011:  55mo ROV & [name] is concerned about her vision> continued f/u DrShapiro & DrRankin w/ more surg that she doesn't understand & we don't have any notes to review;  Still smoking 1/2ppd but denies much cough, sputum, no hemoptysis, SOB, etc> she is due for f/u CXR= stable scarring at the bases, DJD in spine, NAD;  BP controlled on her & Chol ok on her Lip20> see prob list below>> CXR 4/13 showed normal heart size, clear lungs w/ mild scarring at the bases, DJD in spine, NAD... LABS 4/13:  FLP- at goals on Lip20;  Chems- wnl;  CBC- wnl;  TSH=0.92;  VitD=31  ~  June 18, 2012:  55mo ROV & [name] developed AFib in the interval, seen by DrStuckey, tried Cardioversion but this was unsuccessful & pt prefers to remain in AFib w/ rate control on Metoprolol & Coumadin anticoagulation...     Cig smoker> still smokes ~1/2ppd & does not want smoking cessation counseling or help; denies cough, phlegm, hemoptysis, CP, SOB, etc...    HBP> on Metoprolol50Bid, Hyzaar100/12.5, & K10; BP= 138/90 today & even better at home- reminded to reduce sodium, take meds daily; BP sl elev today but she does NOT want to inc meds!    AFib> on above + Coumadin via the CC; unsuccessful cardioversion attempt 8/13; good rate control, she is stable & denies CP, palpit, SOB, edema...    CHOL> on Lipitor20; FLP looks good & all parameters are at goal, she is not fasting today...    GI> on Prilosec OTC prn, Miralax, Metamucil, Analpram Cream; she's had considerable difficulty w/ hemorrhoids but now says "it's not my hem's at all, it's my glands" w/ evals from DrDBrodie, DrToth ([facility]), and DrFuller at [facility]... Follow up colonoscopy 5/12 by DrBrodie showed severe diverticulosis & rectal prolapse...    DJD,  osteopenia> on Ca++ MVI, VitD, Advil; she had right THR in 2005...    Anxiety> she has been on Zoloft & Alpraz in the past... We reviewed prob list, meds, xrays and labs> see below for updates >> OK Flu shot today... LABS 8/13:  Chems- wnl;  CBC- wnl;  Protimes per [facility]... EKG 9/13 showed AFib, rate 78; NSSTTWA...    ~  December 17, 2012:  55mo ROV & [name] states that she's doing well- no new complaints or concerns x "just asthma";  We reviewed the following medical problems during today's office visit >>     Cig smoker> still smokes 4-5cig/d & does not want smoking cessation counseling or help; she notes "just asthma" but denies cough, sput, hemoptysis, SOB, etc; advised to quit all smoking!    HBP> on Metoprolol50-1.5Bid, Hyzaar100/12.5, & K10; BP= 116/70 today- reminded to reduce sodium, take meds daily; she denies CP, palpit, SOB, edema...    AFib> on above + Coumadin via the [facility]; unsuccessful cardioversion attempt 8/13; good rate control, she is stable & denies CP, palpit, SOB, edema...    CHOL> on Lipitor20; FLP looked good 4/13 & all parameters were at goal, she is not fasting today...    GI> on Prilosec OTC prn, Miralax, Metamucil, Analpram Cream; she's had considerable difficulty w/ hemorrhoids but now says "it's not my hem's at  all, it's my glands" w/ evals from DrDBrodie, DrToth ([facility]), and DrFuller at [facility]... Follow up colonoscopy 5/12 by DrBrodie showed severe diverticulosis & rectal prolapse...    DJD, osteopenia> on Ca++ MVI, VitD, Advil; she had right THR in 2005...    Anxiety> she has been on Zoloft & Alpraz in the past... We reviewed prob list, meds, xrays and labs> see below for updates >>  LABS 1/14:  Chems- wnl;  CBC- wnl...   ~  June 18, 2013:  29mo ROV & [name] is still smoking ~6cigs/d   We reviewed prob list, meds, xrays and labs> see below for updates >> OK 2014 Flu shot today...  LABS 10/14:  FLP- at goals on Lip20;  Chems- wnl;  CBC- wnl;  TSH=0.85;   VitD=33 & rec to incr to 2000u daily...           Problem List:      FATIGUE (ICD-780.79) - she rests OK, wakes tired most days, no daytime hypersomnolence...  GLAUCOMA (ICD-365.9) - on eye drops per ophthalmology;  Had bilat cat surg by DrShapiro, then required retinal surg by DrRankin (we don't have records) & she describes on-going prob w/ her vision that has her concerned...  CIGARETTE SMOKER (ICD-305.1) - still smokes ~1/4ppd & must quit, she knows the risks & not interested in smoking cessation help; she is reminded at each & every OV how important it is to quit smoking!!! She refuses smoking cessation help... ~  CXR 10/07 showed sl hyperinflation, mild incr Cor... ~  CXR in ER 10/09 showed COPD, bibasilar scarring, NAD.Marland Kitchen. ~  CXR 10/10 showed similar findings- scarring, NAD.Marland Kitchen. ~  CXR 10/11 showed COPD/ Emphysema, bibasilar scarring, DJD sp, NAD. ~  CXR 4/13 showed normal heart size, clear lungs w/ mild scarring at the bases, DJD in spine, NAD.Marland Kitchen. ~  CXR 1/14 showed borderline heart size, clear lungs, degen changes in shoulders and spine... ~  10/14: she reports still smoking 6cig/d & declines smoking cessation help; denies cough, sput, hemoptysis, or ch in her chr DOE/SOB...  HYPERTENSION (ICD-401.9) - controlled on ASA 81mg /d, METOPROLOL 50mg /d & LOSARTAN/HCT 100-12.5 daily..  ~  10/11:  BP= 134/76 & she tells me that she sometimes takes these meds Qod and that she feels better on this schedule & wishes to continue this way... ~  4/12:  BP= 140/70 & she denies HA, CP, palpit, SOB, edema, etc... ~  10/12:  BP= 148/82 & she remains asymptomatic... ~  4/13:  BP= 140/82 & she denies HAs, CP, palpit, dizzy, SOB, edema... ~  10/13:  BP= 138/90 & she denies symptoms and does not want to incr meds at this time, she has f/u DrStuckey soon... ~  4/14:  on Metoprolol50-1.5Bid, Hyzaar100/12.5, & K10; BP= 116/70 today- reminded to reduce sodium, take meds daily; she denies CP, palpit, SOB,  edema. ~  10/14: on Metop100Bid & Hyzaar100-12.5, K10; BP= 112/72, pulse=97 irreg; she remains largely asymptomatic w/o CP, palpit ch in SOB, etc...   ATRIAL FIBRILLATION >> prev followed by DrStuckey=> now DrCrenshaw... ~  They tried DCCV 8/13 but it failed & she remains in AFib on rate control & Coumadin...  ~  2DEcho 6/13 showed norm LVF w/ EF= 55-65%, norm wall motion, Gr2DD, mild MR...  ~  EKG 9/14 showed AFib, rate79, poor R prog V1-V2, NSSTTWA...  ~  Holter Monitor 10/14 showed sustained AFib, rate controlled on Metoprolol increased to 100bid, she declined NOAC & preferred her Coumadin...  ~  On Metop100Bid  for rate control & Coumadin anticoagulation...  HYPERCHOLESTEROLEMIA (ICD-272.0) - on LIPITOR 20mg /d... ~  FLP 4/09 showed TChol 181, TG 73, HDL 54, LDL 112 ~  FLP 4/10 showed TChol 168, TG 63, HDL 53, LDL 102 ~  FLP 4/11 on Lip20 showed TChol 169, TG 64, HDL 57, LDL 100 ~  FLP 4/12 on Lip20 showed TChol 163, TG 52, HDL 70, LDL 83... Continue same. ~  FLP 4/13 on Lip20 showed TChol 170, TG 46, HDL 79, LDL 82 ~  FLP 10/14 on Lip20 showed TChol 151, TG 50, HDL 71, LDL 70... Continue same.  DYSPEPSIA (ICD-536.8) - uses PRILOSEC OTC as needed...  DIVERTICULOSIS OF COLON (ICD-562.10) - she alternates MIRALAX & METAMUCIL Qod... COLONIC POLYPS (ICD-211.3)  Family Hx of COLON CANCER (ICD-153.9) HEMORRHOIDS (ICD-455.6) - "Hemorrhoids are my biggest problem" > she will f/u GI- DrDBrodie... OTHER SPECIFIED DISORDER OF RECTUM AND ANUS (ICD-569.49) -  - eval by DrToth & referred to [facility] DrFuller for painful defecation... he recommended muscle retraining but initially her insurance wouldn't cover this, later they did cover & she had the "muscle therapy" now doing home therapy... ~  Colonoscopy by DrBrodie 6/07 showed divertics, hems... ~  Colonoscopy 5/12 showed severe diverticulosis & rectal prolapse...  RENAL CYST (ICD-593.2) - Abd Sonar 11/09 showed sm bilat renal cysts...  DEGENERATIVE  JOINT DISEASE (ICD-715.90) - s/p right THR 3/05 by DrBeane... she also has a known lumbar spondylosis w/ right leg radiculopathy... she states that exercise is helping... ~  10/10: c/o knee pain- she declines Rheum/ Ortho referral... continue OTC meds (Advil) Prn...  OSTEOPENIA (ICD-733.90) & VITAMIN D DEFICIENCY (ICD-268.9) -  ~  labs 4/10 showed Vit D level = 23 & started on Vit D 1000 u OTC daily... ~  BMD 5/11 showed TScores -1.2 in Spine, and -1.6 in [facility]... on Calcium, MVI, Vit D. ~  labs 5/11 showed Vit D level = 41... continue 1000 u daily. ~  Labs 4/13 showed Vit D level = 31 ~  Labs 10/14 showed VitD level = 33... Rec to incr to 2000u daily...  EPISODE of CONFUSION >> 1/14 went to ER w/ confusion, found to have UTI & treated; CTBrain showed mild atrophy, otherw NAD... Hx of RESTLESS LEGS SYNDROME (ICD-333.94) - she tried Requip 1mg  Qhs in the past> now off...  ANXIETY (ICD-300.00) - she states that she stopped the Alprazolam (prev 0.5mg  Prn)... ~  10/09 states depressed- 2 sisters died 01/21/2023- one w/ ca pancreas, other w/ DM/ ASPVD.Marland Kitchen. tried Zofoft transiently then stopped.  HEALTH MAINTENANCE:  she sees DrMezer et al for GYN, neg mammogram 3/12... she gets the yearly seasonal Flu vaccines, and had the PNEUMOVAX in 2005 at age 51...   Past Surgical History  Procedure Laterality Date  . Total hip arthroplasty  2/05    right by Dr. Shelle Iron  . Cataract extraction    . Upper gastrointestinal endoscopy    . Colonoscopy    . Retina surgery x 2  2013  . Cardioversion  04/23/2012    Procedure: CARDIOVERSION;  Surgeon: Herby Abraham, MD;  Location: [facility] ENDOSCOPY;  Service: Cardiovascular;  Laterality: N/A;    Outpatient Encounter Prescriptions as of 06/18/2013  Medication Sig Dispense Refill  . atorvastatin (LIPITOR) 20 MG tablet take 1 tablet by mouth once daily  30 tablet  5  . calcium-vitamin D (OSCAL WITH D) 500-200 MG-UNIT per tablet Take 1 tablet by mouth daily.        .  Cholecalciferol (  VITAMIN D3) 1000 UNITS CAPS Take 1 capsule by mouth daily.        Marland Kitchen ibuprofen (ADVIL) 200 MG tablet as needed.        . latanoprost (XALATAN) 0.005 % ophthalmic solution Place 1 drop into both eyes at bedtime.       Marland Kitchen levobunolol (BETAGAN) 0.5 % ophthalmic solution Place 1 drop into both eyes 2 (two) times daily.       Marland Kitchen losartan-hydrochlorothiazide (HYZAAR) 50-12.5 MG per tablet Take 1 tablet by mouth daily. DO NOT START UNTIL YOU FINISH CURRENT BOTTLE OF HYZAAR  30 tablet  11  . metoprolol (LOPRESSOR) 100 MG tablet Take 1 tablet (100 mg total) by mouth 2 (two) times daily.  60 tablet  12  . Multiple Vitamin (MULTIVITAMIN) tablet Take 1 tablet by mouth daily.        . polyethylene glycol powder (MIRALAX) powder Take 17 g by mouth as needed.       . potassium chloride (K-DUR) 10 MEQ tablet Take 1 tablet (10 mEq total) by mouth daily.  30 tablet  6  . travoprost, benzalkonium, (TRAVATAN) 0.004 % ophthalmic solution Place 1 drop into the left eye 3 (three) times daily.       Marland Kitchen warfarin (COUMADIN) 2.5 MG tablet Take as directed by coumadin clinic  90 tablet  3   No facility-administered encounter medications on file as of 06/18/2013.    No Known Allergies   Current Medications, Allergies, Past Medical History, Past Surgical History, Family History, and Social History were reviewed in [employer] record.    Review of Systems       See HPI - all other systems neg except as noted... The patient complains of dyspnea on exertion.  The patient denies anorexia, fever, weight loss, weight gain, vision loss, decreased hearing, hoarseness, chest pain, syncope, peripheral edema, prolonged cough, headaches, hemoptysis, abdominal pain, melena, hematochezia, severe indigestion/heartburn, hematuria, incontinence, muscle weakness, suspicious skin lesions, transient blindness, difficulty walking, depression, unusual weight change, abnormal bleeding, enlarged lymph nodes,  and angioedema.     Objective:   Physical Exam     WD, WN, 79 y/o [name] in NAD... GENERAL:  Alert & oriented; pleasant & cooperative. HEENT:  [place]/AT, EOM-wnl, PERRLA, EACs-clear, TMs-wnl, NOSE-clear, THROAT-clear & wnl. NECK:  Supple w/ fairROM; no JVD; normal carotid impulses w/o bruits; no thyromegaly or nodules palpated; no lymphadenopathy. CHEST:  Clear to P & A; without wheezes/ rales/ or rhonchi. HEART:  Regular Rhythm;  gr 1/6 SEM without rubs or gallops heard... ABDOMEN:  Soft & nontender; normal bowel sounds; no organomegaly or masses detected. EXT: without deformities, mod arthritic changes; no varicose veins/ +venous insuffic/ tr edema. NEURO:  CN's intact;  no focal neuro deficits... DERM:  No lesions noted; no rash etc...  RADIOLOGY DATA:  Reviewed in the EPIC EMR & discussed w/ the patient...  LABORATORY DATA:  Reviewed in the EPIC EMR & discussed w/ the patient...   Assessment & Plan:            Visual problems>  Followed by DrShapiro & DrRankin; encouraged to ask questions so she can better understand what is going on...  SMOKER>  We again reviewed the importance of smoking cessation, she declines smoking cessation help...  HBP>  Controlled on BBlocker, ARB, Diuretic, Potassium;  Continue same + no salt etc...  AFIB>  New prob 6/13, she is followed by DrStuckey & had cardioversion attempt 8/13, unsuccessful & she has opted for rate  control & Coumadin...  CHOL>  She remains stable on the Lip20;  Continue same + diet/ exercise...  DYSPEPSIA>  Continue Prilosec OTC Prn...  HEM's>  She has followed up w/ DrDBrodie & had colonoscopy 5/12...  DJD/ Osteopenia>  As noted right THR 2005, known LBP/ spondylosis; c/o knee pain on & off & uses OTC analgesics prn...  Anxiety> she declines anxiolytic Rx...   Patient's Medications  New Prescriptions   No medications on file  Previous Medications   CALCIUM-VITAMIN D (OSCAL WITH D) 500-200 MG-UNIT PER TABLET    Take  1 tablet by mouth daily.     CHOLECALCIFEROL (VITAMIN D) 2000 UNITS CAPS    Take 1 capsule by mouth daily.   IBUPROFEN (ADVIL) 200 MG TABLET    as needed.     LATANOPROST (XALATAN) 0.005 % OPHTHALMIC SOLUTION    Place 1 drop into both eyes at bedtime.    LEVOBUNOLOL (BETAGAN) 0.5 % OPHTHALMIC SOLUTION    Place 1 drop into both eyes 2 (two) times daily.    LOSARTAN-HYDROCHLOROTHIAZIDE (HYZAAR) 50-12.5 MG PER TABLET    Take 1 tablet by mouth daily. DO NOT START UNTIL YOU FINISH CURRENT BOTTLE OF HYZAAR   METOPROLOL (LOPRESSOR) 100 MG TABLET    Take 1 tablet (100 mg total) by mouth 2 (two) times daily.   MULTIPLE VITAMIN (MULTIVITAMIN) TABLET    Take 1 tablet by mouth daily.     POLYETHYLENE GLYCOL POWDER (MIRALAX) POWDER    Take 17 g by mouth as needed.    TRAVOPROST, BENZALKONIUM, (TRAVATAN) 0.004 % OPHTHALMIC SOLUTION    Place 1 drop into the left eye 3 (three) times daily.    WARFARIN (COUMADIN) 2.5 MG TABLET    Take as directed by coumadin clinic  Modified Medications   Modified Medication Previous Medication   ATORVASTATIN (LIPITOR) 20 MG TABLET atorvastatin (LIPITOR) 20 MG tablet      take 1 tablet by mouth once daily    take 1 tablet by mouth once daily   POTASSIUM CHLORIDE (K-DUR) 10 MEQ TABLET potassium chloride (K-DUR) 10 MEQ tablet      Take 1 tablet (10 mEq total) by mouth daily.    Take 1 tablet (10 mEq total) by mouth daily.  Discontinued Medications   CHOLECALCIFEROL (VITAMIN D3) 1000 UNITS CAPS    Take 1 capsule by mouth daily.

## 2013-06-22 ENCOUNTER — Other Ambulatory Visit (INDEPENDENT_AMBULATORY_CARE_PROVIDER_SITE_OTHER): Payer: Medicare Other

## 2013-06-22 DIAGNOSIS — I10 Essential (primary) hypertension: Secondary | ICD-10-CM | POA: Diagnosis not present

## 2013-06-22 DIAGNOSIS — F411 Generalized anxiety disorder: Secondary | ICD-10-CM

## 2013-06-22 DIAGNOSIS — E559 Vitamin D deficiency, unspecified: Secondary | ICD-10-CM | POA: Diagnosis not present

## 2013-06-22 DIAGNOSIS — E7800 Pure hypercholesterolemia, unspecified: Secondary | ICD-10-CM

## 2013-06-22 DIAGNOSIS — D126 Benign neoplasm of colon, unspecified: Secondary | ICD-10-CM | POA: Diagnosis not present

## 2013-06-22 LAB — BASIC METABOLIC PANEL
CO2: 29 mEq/L (ref 19–32)
Calcium: 9.4 mg/dL (ref 8.4–10.5)
Glucose, Bld: 89 mg/dL (ref 70–99)
Potassium: 4.7 mEq/L (ref 3.5–5.1)
Sodium: 142 mEq/L (ref 135–145)

## 2013-06-22 LAB — CBC WITH DIFFERENTIAL/PLATELET
Basophils Absolute: 0 10*3/uL (ref 0.0–0.1)
Eosinophils Absolute: 0.1 10*3/uL (ref 0.0–0.7)
Eosinophils Relative: 3.4 % (ref 0.0–5.0)
Hemoglobin: 14.3 g/dL (ref 12.0–15.0)
Lymphocytes Relative: 27.7 % (ref 12.0–46.0)
Lymphs Abs: 1.2 10*3/uL (ref 0.7–4.0)
MCHC: 33.6 g/dL (ref 30.0–36.0)
Monocytes Relative: 10.5 % (ref 3.0–12.0)
Neutro Abs: 2.5 10*3/uL (ref 1.4–7.7)
Neutrophils Relative %: 57.9 % (ref 43.0–77.0)
Platelets: 163 10*3/uL (ref 150.0–400.0)
RDW: 14.2 % (ref 11.5–14.6)
WBC: 4.3 10*3/uL — ABNORMAL LOW (ref 4.5–10.5)

## 2013-06-22 LAB — LIPID PANEL
HDL: 71.4 mg/dL (ref >39.00)
Triglycerides: 50 mg/dL (ref 0.0–149.0)

## 2013-06-22 LAB — HEPATIC FUNCTION PANEL
AST: 25 U/L (ref 0–37)
Albumin: 3.8 g/dL (ref 3.5–5.2)
Alkaline Phosphatase: 61 U/L (ref 39–117)
Total Protein: 7 g/dL (ref 6.0–8.3)

## 2013-06-22 LAB — TSH: TSH: 0.85 u[IU]/mL (ref 0.35–5.50)

## 2013-07-06 ENCOUNTER — Ambulatory Visit (INDEPENDENT_AMBULATORY_CARE_PROVIDER_SITE_OTHER): Payer: Medicare Other | Admitting: *Deleted

## 2013-07-06 DIAGNOSIS — Z7901 Long term (current) use of anticoagulants: Secondary | ICD-10-CM | POA: Diagnosis not present

## 2013-07-06 DIAGNOSIS — I4891 Unspecified atrial fibrillation: Secondary | ICD-10-CM | POA: Diagnosis not present

## 2013-07-06 LAB — HM DEXA SCAN

## 2013-07-06 LAB — POCT INR: INR: 3

## 2013-07-15 DIAGNOSIS — Z1212 Encounter for screening for malignant neoplasm of rectum: Secondary | ICD-10-CM | POA: Diagnosis not present

## 2013-07-15 DIAGNOSIS — Z01419 Encounter for gynecological examination (general) (routine) without abnormal findings: Secondary | ICD-10-CM | POA: Diagnosis not present

## 2013-07-15 DIAGNOSIS — Z1231 Encounter for screening mammogram for malignant neoplasm of breast: Secondary | ICD-10-CM | POA: Diagnosis not present

## 2013-08-03 ENCOUNTER — Ambulatory Visit (INDEPENDENT_AMBULATORY_CARE_PROVIDER_SITE_OTHER): Payer: Medicare Other | Admitting: *Deleted

## 2013-08-03 DIAGNOSIS — I4891 Unspecified atrial fibrillation: Secondary | ICD-10-CM

## 2013-08-03 DIAGNOSIS — H4011X0 Primary open-angle glaucoma, stage unspecified: Secondary | ICD-10-CM | POA: Diagnosis not present

## 2013-08-03 DIAGNOSIS — H409 Unspecified glaucoma: Secondary | ICD-10-CM | POA: Diagnosis not present

## 2013-08-03 DIAGNOSIS — Z7901 Long term (current) use of anticoagulants: Secondary | ICD-10-CM

## 2013-08-03 LAB — POCT INR: INR: 3.1

## 2013-08-18 ENCOUNTER — Ambulatory Visit (INDEPENDENT_AMBULATORY_CARE_PROVIDER_SITE_OTHER): Payer: Medicare Other | Admitting: Pharmacist

## 2013-08-18 DIAGNOSIS — Z7901 Long term (current) use of anticoagulants: Secondary | ICD-10-CM | POA: Diagnosis not present

## 2013-08-18 DIAGNOSIS — I4891 Unspecified atrial fibrillation: Secondary | ICD-10-CM | POA: Diagnosis not present

## 2013-08-25 ENCOUNTER — Other Ambulatory Visit: Payer: Self-pay | Admitting: *Deleted

## 2013-08-25 DIAGNOSIS — E876 Hypokalemia: Secondary | ICD-10-CM

## 2013-08-25 MED ORDER — POTASSIUM CHLORIDE ER 10 MEQ PO TBCR: 10.0000 meq | EXTENDED_RELEASE_TABLET | Freq: Every day | ORAL | Status: DC

## 2013-09-08 ENCOUNTER — Ambulatory Visit (INDEPENDENT_AMBULATORY_CARE_PROVIDER_SITE_OTHER): Payer: Medicare Other | Admitting: *Deleted

## 2013-09-08 DIAGNOSIS — Z7901 Long term (current) use of anticoagulants: Secondary | ICD-10-CM | POA: Diagnosis not present

## 2013-09-08 DIAGNOSIS — Z5181 Encounter for therapeutic drug level monitoring: Secondary | ICD-10-CM | POA: Diagnosis not present

## 2013-09-08 DIAGNOSIS — I4891 Unspecified atrial fibrillation: Secondary | ICD-10-CM

## 2013-09-08 LAB — POCT INR: INR: 2.3

## 2013-09-18 ENCOUNTER — Other Ambulatory Visit: Payer: Self-pay | Admitting: Pulmonary Disease

## 2013-10-05 ENCOUNTER — Ambulatory Visit (INDEPENDENT_AMBULATORY_CARE_PROVIDER_SITE_OTHER): Payer: Medicare Other | Admitting: *Deleted

## 2013-10-05 DIAGNOSIS — I4891 Unspecified atrial fibrillation: Secondary | ICD-10-CM | POA: Diagnosis not present

## 2013-10-05 DIAGNOSIS — Z7901 Long term (current) use of anticoagulants: Secondary | ICD-10-CM | POA: Diagnosis not present

## 2013-10-05 LAB — POCT INR: INR: 3.9

## 2013-10-05 NOTE — Progress Notes (Signed)
I can not close this

## 2013-10-19 ENCOUNTER — Ambulatory Visit (INDEPENDENT_AMBULATORY_CARE_PROVIDER_SITE_OTHER): Payer: Medicare Other | Admitting: *Deleted

## 2013-10-19 DIAGNOSIS — I4891 Unspecified atrial fibrillation: Secondary | ICD-10-CM

## 2013-10-19 DIAGNOSIS — Z7901 Long term (current) use of anticoagulants: Secondary | ICD-10-CM | POA: Diagnosis not present

## 2013-10-19 LAB — POCT INR: INR: 2.4

## 2013-11-02 ENCOUNTER — Ambulatory Visit (INDEPENDENT_AMBULATORY_CARE_PROVIDER_SITE_OTHER): Payer: Medicare Other | Admitting: Pharmacist

## 2013-11-02 DIAGNOSIS — Z9889 Other specified postprocedural states: Secondary | ICD-10-CM | POA: Diagnosis not present

## 2013-11-02 DIAGNOSIS — H4011X0 Primary open-angle glaucoma, stage unspecified: Secondary | ICD-10-CM | POA: Diagnosis not present

## 2013-11-02 DIAGNOSIS — I4891 Unspecified atrial fibrillation: Secondary | ICD-10-CM

## 2013-11-02 DIAGNOSIS — Z7901 Long term (current) use of anticoagulants: Secondary | ICD-10-CM

## 2013-11-02 DIAGNOSIS — H409 Unspecified glaucoma: Secondary | ICD-10-CM | POA: Diagnosis not present

## 2013-11-02 LAB — POCT INR: INR: 3.2

## 2013-11-04 ENCOUNTER — Other Ambulatory Visit: Payer: Self-pay | Admitting: Pulmonary Disease

## 2013-11-04 DIAGNOSIS — I10 Essential (primary) hypertension: Secondary | ICD-10-CM

## 2013-11-04 DIAGNOSIS — M899 Disorder of bone, unspecified: Secondary | ICD-10-CM

## 2013-11-04 DIAGNOSIS — E559 Vitamin D deficiency, unspecified: Secondary | ICD-10-CM

## 2013-11-04 DIAGNOSIS — M949 Disorder of cartilage, unspecified: Principal | ICD-10-CM

## 2013-11-16 ENCOUNTER — Ambulatory Visit (INDEPENDENT_AMBULATORY_CARE_PROVIDER_SITE_OTHER): Payer: Medicare Other | Admitting: *Deleted

## 2013-11-16 DIAGNOSIS — I4891 Unspecified atrial fibrillation: Secondary | ICD-10-CM | POA: Diagnosis not present

## 2013-11-16 DIAGNOSIS — Z7901 Long term (current) use of anticoagulants: Secondary | ICD-10-CM

## 2013-11-16 LAB — POCT INR: INR: 2

## 2013-11-19 ENCOUNTER — Ambulatory Visit: Payer: Medicare Other | Admitting: Cardiology

## 2013-11-25 ENCOUNTER — Other Ambulatory Visit: Payer: Self-pay | Admitting: Cardiology

## 2013-11-26 ENCOUNTER — Other Ambulatory Visit: Payer: Self-pay | Admitting: Cardiology

## 2013-12-01 ENCOUNTER — Ambulatory Visit (INDEPENDENT_AMBULATORY_CARE_PROVIDER_SITE_OTHER): Payer: Medicare Other | Admitting: Pharmacist

## 2013-12-01 DIAGNOSIS — I4891 Unspecified atrial fibrillation: Secondary | ICD-10-CM | POA: Diagnosis not present

## 2013-12-01 DIAGNOSIS — Z7901 Long term (current) use of anticoagulants: Secondary | ICD-10-CM | POA: Diagnosis not present

## 2013-12-01 LAB — POCT INR: INR: 2.6

## 2013-12-07 DIAGNOSIS — Z9889 Other specified postprocedural states: Secondary | ICD-10-CM | POA: Diagnosis not present

## 2013-12-07 DIAGNOSIS — H4011X0 Primary open-angle glaucoma, stage unspecified: Secondary | ICD-10-CM | POA: Diagnosis not present

## 2013-12-07 DIAGNOSIS — H409 Unspecified glaucoma: Secondary | ICD-10-CM | POA: Diagnosis not present

## 2013-12-15 ENCOUNTER — Ambulatory Visit: Payer: Medicare Other | Admitting: Pulmonary Disease

## 2013-12-22 ENCOUNTER — Ambulatory Visit (INDEPENDENT_AMBULATORY_CARE_PROVIDER_SITE_OTHER): Payer: Medicare Other | Admitting: Cardiology

## 2013-12-22 ENCOUNTER — Encounter: Payer: Self-pay | Admitting: Cardiology

## 2013-12-22 ENCOUNTER — Ambulatory Visit (INDEPENDENT_AMBULATORY_CARE_PROVIDER_SITE_OTHER): Payer: Medicare Other | Admitting: Pharmacist

## 2013-12-22 VITALS — BP 130/81 | HR 75 | Ht 66.0 in | Wt 137.4 lb

## 2013-12-22 DIAGNOSIS — Z7901 Long term (current) use of anticoagulants: Secondary | ICD-10-CM | POA: Diagnosis not present

## 2013-12-22 DIAGNOSIS — I4891 Unspecified atrial fibrillation: Secondary | ICD-10-CM

## 2013-12-22 DIAGNOSIS — E7800 Pure hypercholesterolemia, unspecified: Secondary | ICD-10-CM

## 2013-12-22 DIAGNOSIS — F17200 Nicotine dependence, unspecified, uncomplicated: Secondary | ICD-10-CM

## 2013-12-22 DIAGNOSIS — I10 Essential (primary) hypertension: Secondary | ICD-10-CM | POA: Diagnosis not present

## 2013-12-22 DIAGNOSIS — R079 Chest pain, unspecified: Secondary | ICD-10-CM

## 2013-12-22 LAB — POCT INR: INR: 2.7

## 2013-12-22 NOTE — Patient Instructions (Addendum)
Your physician wants you to follow-up in: [place] will receive a reminder letter in the mail two months in advance. If you don't receive a letter, please call our office to schedule the follow-up appointment.   A chest x-ray takes a picture of the organs and structures inside the chest, including the heart, lungs, and blood vessels. This test can show several things, including, whether the heart is enlarges; whether fluid is building up in the lungs; and whether pacemaker / defibrillator leads are still in place. AT [facility] [address] [facility] IN THE BASEMENT  [place] PRADAXA TO REPLACE WARFARIN

## 2013-12-22 NOTE — Assessment & Plan Note (Signed)
Continue statin. 

## 2013-12-22 NOTE — Assessment & Plan Note (Signed)
Patient counseled on discontinuing. 

## 2013-12-22 NOTE — Assessment & Plan Note (Signed)
Patient is experiencing pain in the left rib area that increases with inspiration. Doubt pulmonary embolus as she is on chronic Coumadin. Check chest x-ray.

## 2013-12-22 NOTE — Assessment & Plan Note (Signed)
Blood pressure controlled. Continue present medications. 

## 2013-12-22 NOTE — Progress Notes (Signed)
HPI: FU atrial fibrillation. She has a hx of s/p failed DCCV in 8/13, HTN, HL, tobacco abuse. Echo 01/2012: EF 55-65%, normal wall motion, grade 2 diastolic dysfunction, mild MR. Holter 10/14 showed afib, rate controlled. Last seen Sept 2014. Since then, She denies dyspnea. She has not had exertional chest pain. She has had pain in the left lateral rib area. It has been continuous and increases with inspiration. No palpitations or syncope.   Current Outpatient Prescriptions  Medication Sig Dispense Refill  . atorvastatin (LIPITOR) 20 MG tablet take 1 tablet by mouth once daily  30 tablet  5  . calcium-vitamin D (OSCAL WITH D) 500-200 MG-UNIT per tablet Take 1 tablet by mouth daily.        . Cholecalciferol (VITAMIN D) 2000 UNITS CAPS Take 1 capsule by mouth daily.      Marland Kitchen ibuprofen (ADVIL) 200 MG tablet as needed.        . latanoprost (XALATAN) 0.005 % ophthalmic solution Place 1 drop into both eyes at bedtime.       Marland Kitchen losartan-hydrochlorothiazide (HYZAAR) 50-12.5 MG per tablet Take 1 tablet by mouth daily. DO NOT START UNTIL YOU FINISH CURRENT BOTTLE OF HYZAAR  30 tablet  11  . metoprolol (LOPRESSOR) 100 MG tablet Take 1 tablet (100 mg total) by mouth 2 (two) times daily.  60 tablet  12  . Multiple Vitamin (MULTIVITAMIN) tablet Take 1 tablet by mouth daily.        . polyethylene glycol powder (MIRALAX) powder Take 17 g by mouth as needed.       . potassium chloride (K-DUR,KLOR-CON) 10 MEQ tablet take 1 tablet by mouth once daily  30 tablet  0  . travoprost, benzalkonium, (TRAVATAN) 0.004 % ophthalmic solution Place 1 drop into the left eye 3 (three) times daily.       Marland Kitchen warfarin (COUMADIN) 2.5 MG tablet take as directed BY COUMADIN CLINIC  90 tablet  3  . [DISCONTINUED] potassium chloride (K-DUR) 10 MEQ tablet Take 1 tablet (10 mEq total) by mouth daily.  30 tablet  2   No current facility-administered medications for this visit.     Past Medical History  Diagnosis Date  .  Hypertension   . Hyperlipidemia   . Glaucoma   . Cigarette smoker   . Dyspepsia   . Diverticulosis of colon   . Colonic polyp   . Family history of colon cancer   . Hemorrhoids   . Other specified disorder of rectum and anus   . Renal cyst   . DJD (degenerative joint disease)   . Osteopenia   . Vitamin D deficiency   . Restless leg syndrome   . Anxiety   . Depression   . Dysrhythmia     A-fib    Past Surgical History  Procedure Laterality Date  . Total hip arthroplasty  2/05    right by Dr. Tonita Cong  . Cataract extraction    . Upper gastrointestinal endoscopy    . Colonoscopy    . Retina surgery x 2  2013  . Cardioversion  04/23/2012    Procedure: CARDIOVERSION;  Surgeon: Hillary Bow, MD;  Location: [facility] ENDOSCOPY;  Service: Cardiovascular;  Laterality: N/A;    History   Social History  . Marital Status: Widowed    Spouse Name: N/A    Number of Children: 0  . Years of Education: N/A   Occupational History  . Retired    Social History Main  Topics  . Smoking status: Light Tobacco Smoker -- 0.30 packs/day  . Smokeless tobacco: Never Used     Comment: 6 cig a day  . Alcohol Use: No  . Drug Use: No  . Sexual Activity: Not Currently   Other Topics Concern  . Not on file   Social History Narrative   Exercises 2 times per week   Caffeine use: 1 cup coffee per day    ROS: no fevers or chills, productive cough, hemoptysis, dysphasia, odynophagia, melena, hematochezia, dysuria, hematuria, rash, seizure activity, orthopnea, PND, pedal edema, claudication. Remaining systems are negative.  Physical Exam: Well-developed well-nourished in no acute distress.  Skin is warm and dry.  HEENT is normal.  Neck is supple.  Chest is clear to auscultation with normal expansion.  Cardiovascular exam is irregular Abdominal exam nontender or distended. No masses palpated. Extremities show no edema. neuro grossly intact  ECG Atrial fibrillation at a rate of 75. Nonspecific  ST changes.

## 2013-12-22 NOTE — Assessment & Plan Note (Signed)
Continue beta blocker and Coumadin. Patient given the names of NOAC agents. She will check with her insurance company and if they are covered she will consider changing.

## 2013-12-24 ENCOUNTER — Ambulatory Visit (INDEPENDENT_AMBULATORY_CARE_PROVIDER_SITE_OTHER)
Admission: RE | Admit: 2013-12-24 | Discharge: 2013-12-24 | Disposition: A | Discharge status: Home / Self Care | Payer: Medicare Other | Source: Ambulatory Visit | Attending: Cardiology | Admitting: Cardiology

## 2013-12-24 DIAGNOSIS — R079 Chest pain, unspecified: Secondary | ICD-10-CM

## 2014-01-04 ENCOUNTER — Other Ambulatory Visit: Payer: Self-pay | Admitting: Cardiology

## 2014-01-19 ENCOUNTER — Ambulatory Visit (INDEPENDENT_AMBULATORY_CARE_PROVIDER_SITE_OTHER): Payer: Medicare Other | Admitting: Pharmacist

## 2014-01-19 DIAGNOSIS — I4891 Unspecified atrial fibrillation: Secondary | ICD-10-CM

## 2014-01-19 DIAGNOSIS — Z7901 Long term (current) use of anticoagulants: Secondary | ICD-10-CM | POA: Diagnosis not present

## 2014-01-19 LAB — POCT INR: INR: 2.1

## 2014-02-05 ENCOUNTER — Other Ambulatory Visit: Payer: Self-pay | Admitting: Physician Assistant

## 2014-02-10 ENCOUNTER — Ambulatory Visit (INDEPENDENT_AMBULATORY_CARE_PROVIDER_SITE_OTHER): Payer: Medicare Other | Admitting: Internal Medicine

## 2014-02-10 ENCOUNTER — Other Ambulatory Visit (INDEPENDENT_AMBULATORY_CARE_PROVIDER_SITE_OTHER): Payer: Medicare Other

## 2014-02-10 ENCOUNTER — Encounter: Payer: Self-pay | Admitting: Internal Medicine

## 2014-02-10 VITALS — BP 132/80 | HR 94 | Temp 98.2°F | Ht 66.0 in | Wt 135.0 lb

## 2014-02-10 DIAGNOSIS — E7800 Pure hypercholesterolemia, unspecified: Secondary | ICD-10-CM

## 2014-02-10 DIAGNOSIS — R5381 Other malaise: Secondary | ICD-10-CM

## 2014-02-10 DIAGNOSIS — R5383 Other fatigue: Secondary | ICD-10-CM

## 2014-02-10 DIAGNOSIS — I10 Essential (primary) hypertension: Secondary | ICD-10-CM

## 2014-02-10 DIAGNOSIS — I4891 Unspecified atrial fibrillation: Secondary | ICD-10-CM | POA: Diagnosis not present

## 2014-02-10 DIAGNOSIS — G2581 Restless legs syndrome: Secondary | ICD-10-CM | POA: Diagnosis not present

## 2014-02-10 LAB — VITAMIN B12: Vitamin B-12: 662 pg/mL (ref 211–911)

## 2014-02-10 LAB — FERRITIN: FERRITIN: 86.3 ng/mL (ref 10.0–291.0)

## 2014-02-10 NOTE — Assessment & Plan Note (Signed)
Rate controlled on beta-blocker anticoag with coumadin at [facility]

## 2014-02-10 NOTE — Assessment & Plan Note (Signed)
BP Readings from Last 3 Encounters:  02/10/14 132/80  12/22/13 130/81  06/18/13 112/72   The current medical regimen is effective;  continue present plan and medications.

## 2014-02-10 NOTE — Assessment & Plan Note (Signed)
Check ferritin Prior requip ineffective

## 2014-02-10 NOTE — Assessment & Plan Note (Signed)
On atorva for years tol well - reviewed last labs The current medical regimen is effective;  continue present plan and medications.

## 2014-02-10 NOTE — Progress Notes (Signed)
Subjective:    Patient ID: [name], female    DOB: [birth year], 80 y.o.   MRN: [id number]  HPI  Here to establish care with new PCP following retirement of [facility] Reviewed chronic medical issues and interval medical events  Past Medical History  Diagnosis Date  . Hypertension   . Hyperlipidemia   . Glaucoma   . Cigarette smoker   . Diverticulosis of colon   . Family history of colon cancer   . Hemorrhoids   . Renal cyst   . DJD (degenerative joint disease)   . Osteopenia   . Vitamin D deficiency   . Restless leg syndrome   . Anxiety   . Depression   . Atrial fibrillation    Family History  Problem Relation Age of Onset  . Hypertension Mother   . Diabetes Mother   . Breast cancer Other     niece  . Colon cancer Brother   . Prostate cancer Father   . Diabetes Brother   . Diabetes Sister    History  Substance Use Topics  . Smoking status: Light Tobacco Smoker -- 0.30 packs/day  . Smokeless tobacco: Never Used     Comment: 6 cig a day  . Alcohol Use: No   Review of Systems  Constitutional: Positive for fatigue. Negative for fever and unexpected weight change.  Respiratory: Negative for cough, shortness of breath and wheezing.   Cardiovascular: Negative for chest pain, palpitations and leg swelling.  Gastrointestinal: Positive for diarrhea (3-4 BMs/day, loose but not liquid - chronic for years). Negative for nausea, vomiting, abdominal pain, constipation, blood in stool, anal bleeding and rectal pain.  Neurological: Negative for dizziness, weakness, light-headedness and headaches.  Psychiatric/Behavioral: Negative for dysphoric mood. The patient is not nervous/anxious.   All other systems reviewed and are negative.      Objective:   Physical Exam  BP 132/80  Pulse 94  Temp(Src) 98.2 F (36.8 C) (Oral)  Ht 5\' 6"  (1.676 m)  Wt 135 lb (61.236 kg)  BMI 21.80 kg/m2  SpO2 94% Wt Readings from Last 3 Encounters:  02/10/14 135 lb (61.236 kg)  12/22/13  137 lb 6.4 oz (62.324 kg)  06/18/13 136 lb (61.689 kg)   Constitutional: She appears well-developed and well-nourished. No distress.  Neck: Normal range of motion. Neck supple. No JVD present. No thyromegaly present.  Cardiovascular: Normal rate, regular rhythm and normal heart sounds.  No murmur heard. No BLE edema. Pulmonary/Chest: Effort normal and breath sounds normal. No respiratory distress. She has no wheezes.  Abdominal: SNDNT, +BS Psychiatric: She has a mildly anxious mood and affect. Her behavior is normal. Judgment and thought content normal.   Lab Results  Component Value Date   WBC 4.3* 06/22/2013   HGB 14.3 06/22/2013   HCT 42.5 06/22/2013   PLT 163.0 06/22/2013   GLUCOSE 89 06/22/2013   CHOL 151 06/22/2013   TRIG 50.0 06/22/2013   HDL 71.40 06/22/2013   LDLCALC 70 06/22/2013   ALT 29 06/22/2013   AST 25 06/22/2013   NA 142 06/22/2013   K 4.7 06/22/2013   CL 106 06/22/2013   CREATININE 1.0 06/22/2013   BUN 17 06/22/2013   CO2 29 06/22/2013   TSH 0.85 06/22/2013   INR 2.1 01/19/2014   No results found for this basename: VITAMINB12    Dg Chest 2 View  12/24/2013   CLINICAL DATA:  Left lateral rib pain  EXAM: CHEST  2 VIEW  COMPARISON:  None.  FINDINGS: The lungs are hyperinflated likely secondary to COPD. There is no focal parenchymal opacity, pleural effusion, or pneumothorax. The heart and mediastinal contours are unremarkable.  There is osteoarthritis of the right glenohumeral joint. There is mild thoracic spine spondylosis.  IMPRESSION: No active cardiopulmonary disease.   Electronically Signed   By: Kathreen Devoid   On: 12/24/2013 10:52       Assessment & Plan:   Fatigue - nonspecific symptoms/exam - check screening labs including B12 and ferritin  Problem List Items Addressed This Visit   Atrial fibrillation     Rate controlled on beta-blocker anticoag with coumadin at [facility]    Relevant Orders      Vitamin B12   HYPERCHOLESTEROLEMIA     On atorva for  years tol well - reviewed last labs The current medical regimen is effective;  continue present plan and medications.     Relevant Orders      Vitamin B12   HYPERTENSION - Primary      BP Readings from Last 3 Encounters:  02/10/14 132/80  12/22/13 130/81  06/18/13 112/72   The current medical regimen is effective;  continue present plan and medications.     Relevant Orders      Vitamin B12   RESTLESS LEGS SYNDROME     Check ferritin Prior requip ineffective    Relevant Orders      Ferritin      Vitamin B12    Other Visit Diagnoses   Fatigue        Relevant Orders       Ferritin       Vitamin B12

## 2014-02-10 NOTE — Patient Instructions (Signed)
It was good to see you today.  We have reviewed your prior records including labs and tests today  Test(s) ordered today. Your results will be released to [facility] (or called to you) after review, usually within 72hours after test completion. If any changes need to be made, you will be notified at that same time.  Medications reviewed and updated, no changes recommended at this time.  Continue working with your specialists as reviewed today  Try Vitamin B complex and take Vitamin D to help  Please schedule followup in 6 months, call sooner if problems.

## 2014-02-10 NOTE — Progress Notes (Signed)
Pre visit review using our clinic review tool, if applicable. No additional management support is needed unless otherwise documented below in the visit note. 

## 2014-02-16 ENCOUNTER — Ambulatory Visit (INDEPENDENT_AMBULATORY_CARE_PROVIDER_SITE_OTHER): Payer: Medicare Other | Admitting: *Deleted

## 2014-02-16 DIAGNOSIS — I4891 Unspecified atrial fibrillation: Secondary | ICD-10-CM | POA: Diagnosis not present

## 2014-02-16 DIAGNOSIS — Z7901 Long term (current) use of anticoagulants: Secondary | ICD-10-CM

## 2014-02-16 LAB — POCT INR: INR: 3

## 2014-03-16 ENCOUNTER — Ambulatory Visit (INDEPENDENT_AMBULATORY_CARE_PROVIDER_SITE_OTHER): Payer: Medicare Other | Admitting: *Deleted

## 2014-03-16 DIAGNOSIS — I4891 Unspecified atrial fibrillation: Secondary | ICD-10-CM

## 2014-03-16 DIAGNOSIS — Z7901 Long term (current) use of anticoagulants: Secondary | ICD-10-CM

## 2014-03-16 LAB — POCT INR: INR: 2.5

## 2014-03-21 ENCOUNTER — Other Ambulatory Visit: Payer: Self-pay | Admitting: Pulmonary Disease

## 2014-04-04 ENCOUNTER — Other Ambulatory Visit: Payer: Self-pay | Admitting: Cardiology

## 2014-04-12 ENCOUNTER — Other Ambulatory Visit: Payer: Self-pay | Admitting: Pulmonary Disease

## 2014-04-13 ENCOUNTER — Ambulatory Visit (INDEPENDENT_AMBULATORY_CARE_PROVIDER_SITE_OTHER): Payer: Medicare Other | Admitting: *Deleted

## 2014-04-13 DIAGNOSIS — Z7901 Long term (current) use of anticoagulants: Secondary | ICD-10-CM

## 2014-04-13 DIAGNOSIS — I4891 Unspecified atrial fibrillation: Secondary | ICD-10-CM | POA: Diagnosis not present

## 2014-04-13 DIAGNOSIS — H409 Unspecified glaucoma: Secondary | ICD-10-CM | POA: Diagnosis not present

## 2014-04-13 DIAGNOSIS — H4011X0 Primary open-angle glaucoma, stage unspecified: Secondary | ICD-10-CM | POA: Diagnosis not present

## 2014-04-13 LAB — POCT INR: INR: 2.6

## 2014-04-17 ENCOUNTER — Other Ambulatory Visit: Payer: Self-pay | Admitting: Internal Medicine

## 2014-04-18 ENCOUNTER — Other Ambulatory Visit: Payer: Self-pay | Admitting: Cardiology

## 2014-04-19 ENCOUNTER — Other Ambulatory Visit: Payer: Self-pay

## 2014-04-19 MED ORDER — ATORVASTATIN CALCIUM 20 MG PO TABS
ORAL_TABLET | ORAL | Status: DC
Start: 2014-04-19 — End: 2015-02-24

## 2014-05-13 ENCOUNTER — Other Ambulatory Visit: Payer: Self-pay | Admitting: Cardiology

## 2014-05-17 DIAGNOSIS — H4011X0 Primary open-angle glaucoma, stage unspecified: Secondary | ICD-10-CM | POA: Diagnosis not present

## 2014-05-17 DIAGNOSIS — Z9889 Other specified postprocedural states: Secondary | ICD-10-CM | POA: Diagnosis not present

## 2014-05-17 DIAGNOSIS — H409 Unspecified glaucoma: Secondary | ICD-10-CM | POA: Diagnosis not present

## 2014-05-25 ENCOUNTER — Ambulatory Visit (INDEPENDENT_AMBULATORY_CARE_PROVIDER_SITE_OTHER): Payer: Medicare Other | Admitting: Pharmacist

## 2014-05-25 DIAGNOSIS — Z7901 Long term (current) use of anticoagulants: Secondary | ICD-10-CM | POA: Diagnosis not present

## 2014-05-25 DIAGNOSIS — I4891 Unspecified atrial fibrillation: Secondary | ICD-10-CM | POA: Diagnosis not present

## 2014-05-25 LAB — POCT INR: INR: 2.1

## 2014-06-07 ENCOUNTER — Other Ambulatory Visit: Payer: Self-pay | Admitting: Cardiology

## 2014-06-10 ENCOUNTER — Encounter: Payer: Self-pay | Admitting: Internal Medicine

## 2014-06-14 DIAGNOSIS — H4011X2 Primary open-angle glaucoma, moderate stage: Secondary | ICD-10-CM | POA: Diagnosis not present

## 2014-06-14 DIAGNOSIS — Z9889 Other specified postprocedural states: Secondary | ICD-10-CM | POA: Diagnosis not present

## 2014-06-14 DIAGNOSIS — H4011X3 Primary open-angle glaucoma, severe stage: Secondary | ICD-10-CM | POA: Diagnosis not present

## 2014-06-22 NOTE — Progress Notes (Signed)
HPI: FU atrial fibrillation. She has a hx of s/p failed DCCV in 8/13, HTN, HL, tobacco abuse. Echo 01/2012: EF 55-65%, normal wall motion, grade 2 diastolic dysfunction, mild MR. Holter 10/14 showed afib, rate controlled. Since last seen, She has mild dyspnea on exertion. No orthopnea, PND, pedal edema, palpitations, syncope or chest pain. No bleeding.   Current Outpatient Prescriptions  Medication Sig Dispense Refill  . atorvastatin (LIPITOR) 20 MG tablet take 1 tablet by mouth once daily  30 tablet  5  . calcium-vitamin D (OSCAL WITH D) 500-200 MG-UNIT per tablet Take 1 tablet by mouth daily.        . Cholecalciferol (VITAMIN D) 2000 UNITS CAPS Take 1 capsule by mouth daily.      . dorzolamide-timolol (COSOPT) 22.3-6.8 MG/ML ophthalmic solution Place 1 drop into both eyes 2 (two) times daily.      Marland Kitchen ibuprofen (ADVIL) 200 MG tablet as needed.        . latanoprost (XALATAN) 0.005 % ophthalmic solution Place 1 drop into both eyes at bedtime.       Marland Kitchen losartan-hydrochlorothiazide (HYZAAR) 50-12.5 MG per tablet take 1 tablet by mouth once daily  30 tablet  3  . metoprolol (LOPRESSOR) 100 MG tablet take 1 tablet by mouth twice a day  60 tablet  3  . Multiple Vitamin (MULTIVITAMIN) tablet Take 1 tablet by mouth daily.        . pilocarpine (PILOCAR) 1 % ophthalmic solution Place 1 drop into the left eye 2 (two) times daily.      . polyethylene glycol powder (MIRALAX) powder Take 17 g by mouth as needed.       . potassium chloride (K-DUR,KLOR-CON) 10 MEQ tablet take 1 tablet by mouth once daily  30 tablet  3  . warfarin (COUMADIN) 2.5 MG tablet as directed  90 tablet  1  . [DISCONTINUED] potassium chloride (K-DUR) 10 MEQ tablet Take 1 tablet (10 mEq total) by mouth daily.  30 tablet  2   No current facility-administered medications for this visit.     Past Medical History  Diagnosis Date  . Hypertension   . Hyperlipidemia   . Glaucoma   . Cigarette smoker   . Diverticulosis of colon     . Family history of colon cancer   . Hemorrhoids   . Renal cyst   . DJD (degenerative joint disease)   . Osteopenia   . Vitamin D deficiency   . Restless leg syndrome   . Anxiety   . Depression   . Atrial fibrillation     Past Surgical History  Procedure Laterality Date  . Total hip arthroplasty  2/05    right by Dr. Tonita Cong  . Cataract extraction    . Upper gastrointestinal endoscopy    . Colonoscopy    . Retina surgery x 2  2013  . Cardioversion  04/23/2012    Procedure: CARDIOVERSION;  Surgeon: Hillary Bow, MD;  Location: [facility] ENDOSCOPY;  Service: Cardiovascular;  Laterality: N/A;    History   Social History  . Marital Status: Widowed    Spouse Name: N/A    Number of Children: 0  . Years of Education: N/A   Occupational History  . Retired    Social History Main Topics  . Smoking status: Light Tobacco Smoker -- 0.30 packs/day  . Smokeless tobacco: Never Used     Comment: 6 cig a day  . Alcohol Use: No  . Drug Use:  No  . Sexual Activity: Not Currently   Other Topics Concern  . Not on file   Social History Narrative   Exercises 2 times per week   Caffeine use: 1 cup coffee per day   Lives alone, widowed    ROS: Arthralgias but no fevers or chills, productive cough, hemoptysis, dysphasia, odynophagia, melena, hematochezia, dysuria, hematuria, rash, seizure activity, orthopnea, PND, pedal edema, claudication. Remaining systems are negative.  Physical Exam: Well-developed well-nourished in no acute distress.  Skin is warm and dry.  HEENT is normal.  Neck is supple.  Chest is clear to auscultation with normal expansion.  Cardiovascular exam is irregular Abdominal exam nontender or distended. No masses palpated. Extremities show no edema. neuro grossly intact  ECG Atrial fibrillation at a rate of 101. Nonspecific ST changes.

## 2014-06-23 ENCOUNTER — Encounter: Payer: Self-pay | Admitting: Cardiology

## 2014-06-23 ENCOUNTER — Ambulatory Visit (INDEPENDENT_AMBULATORY_CARE_PROVIDER_SITE_OTHER): Payer: Medicare Other | Admitting: Cardiology

## 2014-06-23 VITALS — BP 122/78 | HR 101 | Ht 66.0 in | Wt 135.5 lb

## 2014-06-23 DIAGNOSIS — I482 Chronic atrial fibrillation, unspecified: Secondary | ICD-10-CM

## 2014-06-23 DIAGNOSIS — Z23 Encounter for immunization: Secondary | ICD-10-CM

## 2014-06-23 DIAGNOSIS — I480 Paroxysmal atrial fibrillation: Secondary | ICD-10-CM

## 2014-06-23 DIAGNOSIS — Z720 Tobacco use: Secondary | ICD-10-CM | POA: Diagnosis not present

## 2014-06-23 DIAGNOSIS — I4891 Unspecified atrial fibrillation: Secondary | ICD-10-CM | POA: Diagnosis not present

## 2014-06-23 DIAGNOSIS — F17200 Nicotine dependence, unspecified, uncomplicated: Secondary | ICD-10-CM

## 2014-06-23 DIAGNOSIS — E7800 Pure hypercholesterolemia, unspecified: Secondary | ICD-10-CM

## 2014-06-23 LAB — HEPATIC FUNCTION PANEL
ALT: 23 U/L (ref 0–35)
AST: 26 U/L (ref 0–37)
Albumin: 4 g/dL (ref 3.5–5.2)
Alkaline Phosphatase: 61 U/L (ref 39–117)
Bilirubin, Direct: 0.2 mg/dL (ref 0.0–0.3)
Indirect Bilirubin: 0.6 mg/dL (ref 0.2–1.2)
TOTAL PROTEIN: 6.5 g/dL (ref 6.0–8.3)
Total Bilirubin: 0.8 mg/dL (ref 0.2–1.2)

## 2014-06-23 LAB — BASIC METABOLIC PANEL WITH GFR
BUN: 21 mg/dL (ref 6–23)
CHLORIDE: 108 meq/L (ref 96–112)
CO2: 25 mEq/L (ref 19–32)
Calcium: 9.4 mg/dL (ref 8.4–10.5)
Creat: 0.92 mg/dL (ref 0.50–1.10)
GFR, EST AFRICAN AMERICAN: 69 mL/min
GFR, EST NON AFRICAN AMERICAN: 60 mL/min
Glucose, Bld: 92 mg/dL (ref 70–99)
POTASSIUM: 4.1 meq/L (ref 3.5–5.3)
SODIUM: 142 meq/L (ref 135–145)

## 2014-06-23 LAB — LIPID PANEL
CHOL/HDL RATIO: 2.3 ratio
CHOLESTEROL: 140 mg/dL (ref 0–200)
HDL: 60 mg/dL (ref >39)
LDL CALC: 67 mg/dL (ref 0–99)
TRIGLYCERIDES: 63 mg/dL (ref <150)
VLDL: 13 mg/dL (ref 0–40)

## 2014-06-23 LAB — CBC
HCT: 42.8 % (ref 36.0–46.0)
HEMOGLOBIN: 14.4 g/dL (ref 12.0–15.0)
MCH: 30.3 pg (ref 26.0–34.0)
MCHC: 33.6 g/dL (ref 30.0–36.0)
MCV: 90.1 fL (ref 78.0–100.0)
Platelets: 170 10*3/uL (ref 150–400)
RBC: 4.75 MIL/uL (ref 3.87–5.11)
RDW: 14.2 % (ref 11.5–15.5)
WBC: 4.2 10*3/uL (ref 4.0–10.5)

## 2014-06-23 NOTE — Patient Instructions (Signed)
Your physician wants you to follow-up in: ONE YEAR WITH DR CRENSHAW You will receive a reminder letter in the mail two months in advance. If you don't receive a letter, please call our office to schedule the follow-up appointment.   Your physician recommends that you HAVE LAB WORK TODAY 

## 2014-06-23 NOTE — Assessment & Plan Note (Signed)
Patient remains in permanent atrial fibrillation. Continue metoprolol for rate control. Continue Coumadin. Check hemoglobin.

## 2014-06-23 NOTE — Assessment & Plan Note (Addendum)
Continue statin. Check lipids and liver. 

## 2014-06-23 NOTE — Assessment & Plan Note (Signed)
Continue present blood pressure medications. Check potassium and renal function. 

## 2014-06-23 NOTE — Assessment & Plan Note (Signed)
Patient counseled on discontinuing. 

## 2014-06-24 ENCOUNTER — Encounter: Payer: Self-pay | Admitting: *Deleted

## 2014-07-02 ENCOUNTER — Other Ambulatory Visit: Payer: Self-pay | Admitting: Cardiology

## 2014-07-02 NOTE — Telephone Encounter (Signed)
E sent to pharmacy 

## 2014-07-06 ENCOUNTER — Ambulatory Visit (INDEPENDENT_AMBULATORY_CARE_PROVIDER_SITE_OTHER): Payer: Medicare Other | Admitting: Pharmacist

## 2014-07-06 DIAGNOSIS — I4891 Unspecified atrial fibrillation: Secondary | ICD-10-CM | POA: Diagnosis not present

## 2014-07-06 DIAGNOSIS — Z7901 Long term (current) use of anticoagulants: Secondary | ICD-10-CM | POA: Diagnosis not present

## 2014-07-06 LAB — POCT INR: INR: 2.1

## 2014-07-19 DIAGNOSIS — Z01419 Encounter for gynecological examination (general) (routine) without abnormal findings: Secondary | ICD-10-CM | POA: Diagnosis not present

## 2014-07-19 DIAGNOSIS — Z1231 Encounter for screening mammogram for malignant neoplasm of breast: Secondary | ICD-10-CM | POA: Diagnosis not present

## 2014-07-19 DIAGNOSIS — Z1212 Encounter for screening for malignant neoplasm of rectum: Secondary | ICD-10-CM | POA: Diagnosis not present

## 2014-07-21 DIAGNOSIS — H4011X2 Primary open-angle glaucoma, moderate stage: Secondary | ICD-10-CM | POA: Diagnosis not present

## 2014-07-21 DIAGNOSIS — H4011X3 Primary open-angle glaucoma, severe stage: Secondary | ICD-10-CM | POA: Diagnosis not present

## 2014-07-21 DIAGNOSIS — Z9889 Other specified postprocedural states: Secondary | ICD-10-CM | POA: Diagnosis not present

## 2014-08-03 ENCOUNTER — Other Ambulatory Visit: Payer: Self-pay | Admitting: Cardiology

## 2014-08-12 ENCOUNTER — Encounter: Payer: Self-pay | Admitting: Internal Medicine

## 2014-08-12 ENCOUNTER — Ambulatory Visit (INDEPENDENT_AMBULATORY_CARE_PROVIDER_SITE_OTHER): Payer: Medicare Other | Admitting: Internal Medicine

## 2014-08-12 VITALS — BP 138/88 | HR 79 | Temp 98.0°F | Resp 18 | Ht 66.0 in | Wt 136.0 lb

## 2014-08-12 DIAGNOSIS — M899 Disorder of bone, unspecified: Secondary | ICD-10-CM

## 2014-08-12 DIAGNOSIS — I482 Chronic atrial fibrillation, unspecified: Secondary | ICD-10-CM

## 2014-08-12 DIAGNOSIS — Z0000 Encounter for general adult medical examination without abnormal findings: Secondary | ICD-10-CM | POA: Diagnosis not present

## 2014-08-12 DIAGNOSIS — Z23 Encounter for immunization: Secondary | ICD-10-CM

## 2014-08-12 DIAGNOSIS — I10 Essential (primary) hypertension: Secondary | ICD-10-CM | POA: Diagnosis not present

## 2014-08-12 DIAGNOSIS — M8580 Other specified disorders of bone density and structure, unspecified site: Secondary | ICD-10-CM

## 2014-08-12 DIAGNOSIS — E7800 Pure hypercholesterolemia, unspecified: Secondary | ICD-10-CM

## 2014-08-12 NOTE — Patient Instructions (Signed)
It was good to see you today.  We have reviewed your prior records including labs and tests today  Health Maintenance reviewed - Prevnar and tetanus updated today - other all recommended immunizations and age-appropriate screenings are up-to-date.  Medications reviewed and updated, no changes recommended at this time.  Please schedule followup in 6 months for semiannual exam and labs, call sooner if problems.

## 2014-08-12 NOTE — Progress Notes (Signed)
Subjective:    Patient ID: [name], female    DOB: 1933/11/05, 80 y.o.   MRN: [id number]  HPI   Here for medicare wellness  Diet: heart healthy  Physical activity: sedentary Depression/mood screen: negative Hearing: intact to whispered voice Visual acuity: grossly normal, performs annual eye exam  ADLs: capable Fall risk: none Home safety: good Cognitive evaluation: intact to orientation, naming, recall and repetition EOL planning: adv directives, full code/ I agree  I have personally reviewed and have noted 1. The patient's medical and social history 2. Their use of alcohol, tobacco or illicit drugs 3. Their current medications and supplements 4. The patient's functional ability including ADL's, fall risks, home safety risks and hearing or visual impairment. 5. Diet and physical activities 6. Evidence for depression or mood disorders  Also reviewed chronic medical issues and interval medical events  Past Medical History  Diagnosis Date  . Hypertension   . Hyperlipidemia   . Glaucoma   . Cigarette smoker   . Diverticulosis of colon   . Family history of colon cancer   . Hemorrhoids   . Renal cyst   . DJD (degenerative joint disease)   . Osteopenia   . Vitamin D deficiency   . Restless leg syndrome   . Anxiety   . Depression   . Atrial fibrillation    Family History  Problem Relation Age of Onset  . Hypertension Mother   . Diabetes Mother   . Breast cancer Other     niece  . Colon cancer Brother   . Prostate cancer Father   . Diabetes Brother   . Diabetes Sister    History  Substance Use Topics  . Smoking status: Light Tobacco Smoker -- 0.30 packs/day  . Smokeless tobacco: Never Used     Comment: 6 cig a day  . Alcohol Use: No    Review of Systems  Constitutional: Negative for fatigue and unexpected weight change.  Respiratory: Negative for cough, shortness of breath and wheezing.   Cardiovascular: Negative for chest pain, palpitations  and leg swelling.  Gastrointestinal: Negative for nausea, abdominal pain and diarrhea.  Neurological: Negative for dizziness, weakness, light-headedness and headaches.  Psychiatric/Behavioral: Negative for dysphoric mood. The patient is not nervous/anxious.   All other systems reviewed and are negative.      Objective:   Physical Exam  BP 138/88 mmHg  Pulse 79  Temp(Src) 98 F (36.7 C)  Resp 18  Ht 5\' 6"  (1.676 m)  Wt 136 lb (61.689 kg)  BMI 21.96 kg/m2  SpO2 96% Wt Readings from Last 3 Encounters:  08/12/14 136 lb (61.689 kg)  06/23/14 135 lb 8 oz (61.462 kg)  02/10/14 135 lb (61.236 kg)   Constitutional: She appears well-developed and well-nourished. No distress.  Neck: Normal range of motion. Neck supple. No JVD present. No thyromegaly present.  Cardiovascular: Normal rate, irregular rhythm and normal heart sounds.  No murmur heard. No BLE edema. Pulmonary/Chest: Effort normal and breath sounds normal. No respiratory distress. She has no wheezes.  Psychiatric: She has a normal mood and affect. Her behavior is normal. Judgment and thought content normal.   Lab Results  Component Value Date   WBC 4.2 06/23/2014   HGB 14.4 06/23/2014   HCT 42.8 06/23/2014   PLT 170 06/23/2014   GLUCOSE 92 06/23/2014   CHOL 140 06/23/2014   TRIG 63 06/23/2014   HDL 60 06/23/2014   LDLCALC 67 06/23/2014   ALT 23 06/23/2014  AST 26 06/23/2014   NA 142 06/23/2014   K 4.1 06/23/2014   CL 108 06/23/2014   CREATININE 0.92 06/23/2014   BUN 21 06/23/2014   CO2 25 06/23/2014   TSH 0.85 06/22/2013   INR 2.1 07/06/2014    Dg Chest 2 View  12/24/2013   CLINICAL DATA:  Left lateral rib pain  EXAM: CHEST  2 VIEW  COMPARISON:  None.  FINDINGS: The lungs are hyperinflated likely secondary to COPD. There is no focal parenchymal opacity, pleural effusion, or pneumothorax. The heart and mediastinal contours are unremarkable.  There is osteoarthritis of the right glenohumeral joint. There is mild  thoracic spine spondylosis.  IMPRESSION: No active cardiopulmonary disease.   Electronically Signed   By: Kathreen Devoid   On: 12/24/2013 10:52       Assessment & Plan:   AWV/z00.00 - Today patient counseled on age appropriate routine health concerns for screening and prevention, each reviewed and up to date or declined. Immunizations reviewed and up to date or declined. Labs reviewed. Risk factors for depression reviewed and negative. Hearing function and visual acuity are intact. ADLs screened and addressed as needed. Functional ability and level of safety reviewed and appropriate. Education, counseling and referrals performed based on assessed risks today. Patient provided with a copy of personalized plan for preventive services.  Problem List Items Addressed This Visit    Atrial fibrillation    Rate controlled on beta-blocker anticoag with coumadin at [facility] Last cards note reviewed    Essential hypertension    BP Readings from Last 3 Encounters:  08/12/14 138/88  06/23/14 122/78  02/10/14 132/80   The current medical regimen is effective;  continue present plan and medications.    HYPERCHOLESTEROLEMIA    On atorva for years tol well - reviewed last labs The current medical regimen is effective;  continue present plan and medications.     Osteopenia of the elderly    Follows with gyn for same Takes Ca and Vit D (including Vit D3 supp daily) for same WB exercises ongoing No hx fracture, no bone pain     Other Visit Diagnoses    Routine general medical examination at a health care facility    -  Primary

## 2014-08-12 NOTE — Assessment & Plan Note (Signed)
On atorva for years tol well - reviewed last labs The current medical regimen is effective;  continue present plan and medications.

## 2014-08-12 NOTE — Assessment & Plan Note (Signed)
Follows with gyn for same Takes Ca and Vit D (including Vit D3 supp daily) for same WB exercises ongoing No hx fracture, no bone pain

## 2014-08-12 NOTE — Assessment & Plan Note (Signed)
Rate controlled on beta-blocker anticoag with coumadin at [facility] Last cards note reviewed 

## 2014-08-12 NOTE — Assessment & Plan Note (Signed)
BP Readings from Last 3 Encounters:  08/12/14 138/88  06/23/14 122/78  02/10/14 132/80   The current medical regimen is effective;  continue present plan and medications.

## 2014-08-13 ENCOUNTER — Telehealth: Payer: Self-pay | Admitting: Internal Medicine

## 2014-08-13 NOTE — Telephone Encounter (Signed)
emmi mailed  °

## 2014-08-17 ENCOUNTER — Ambulatory Visit (INDEPENDENT_AMBULATORY_CARE_PROVIDER_SITE_OTHER): Payer: Medicare Other | Admitting: Pharmacist

## 2014-08-17 DIAGNOSIS — I4891 Unspecified atrial fibrillation: Secondary | ICD-10-CM | POA: Diagnosis not present

## 2014-08-17 DIAGNOSIS — Z7901 Long term (current) use of anticoagulants: Secondary | ICD-10-CM

## 2014-08-17 LAB — POCT INR: INR: 3

## 2014-09-25 ENCOUNTER — Other Ambulatory Visit: Payer: Self-pay | Admitting: Cardiology

## 2014-09-27 NOTE — Telephone Encounter (Signed)
Rx refill sent to patient pharmacy   

## 2014-09-28 ENCOUNTER — Ambulatory Visit (INDEPENDENT_AMBULATORY_CARE_PROVIDER_SITE_OTHER): Payer: Medicare Other

## 2014-09-28 DIAGNOSIS — I4891 Unspecified atrial fibrillation: Secondary | ICD-10-CM

## 2014-09-28 DIAGNOSIS — Z7901 Long term (current) use of anticoagulants: Secondary | ICD-10-CM | POA: Diagnosis not present

## 2014-09-28 LAB — POCT INR: INR: 2.7

## 2014-10-19 ENCOUNTER — Other Ambulatory Visit: Payer: Self-pay | Admitting: Cardiology

## 2014-11-09 ENCOUNTER — Ambulatory Visit (INDEPENDENT_AMBULATORY_CARE_PROVIDER_SITE_OTHER): Payer: Medicare Other | Admitting: *Deleted

## 2014-11-09 DIAGNOSIS — I4891 Unspecified atrial fibrillation: Secondary | ICD-10-CM

## 2014-11-09 DIAGNOSIS — Z7901 Long term (current) use of anticoagulants: Secondary | ICD-10-CM | POA: Diagnosis not present

## 2014-11-09 LAB — POCT INR: INR: 3.5

## 2014-11-13 ENCOUNTER — Other Ambulatory Visit: Payer: Self-pay | Admitting: Cardiology

## 2014-11-30 ENCOUNTER — Ambulatory Visit (INDEPENDENT_AMBULATORY_CARE_PROVIDER_SITE_OTHER): Payer: Medicare Other | Admitting: *Deleted

## 2014-11-30 DIAGNOSIS — I4891 Unspecified atrial fibrillation: Secondary | ICD-10-CM | POA: Diagnosis not present

## 2014-11-30 DIAGNOSIS — Z7901 Long term (current) use of anticoagulants: Secondary | ICD-10-CM | POA: Diagnosis not present

## 2014-11-30 LAB — POCT INR: INR: 2.8

## 2014-12-28 ENCOUNTER — Ambulatory Visit (INDEPENDENT_AMBULATORY_CARE_PROVIDER_SITE_OTHER): Payer: Medicare Other | Admitting: *Deleted

## 2014-12-28 ENCOUNTER — Encounter: Payer: Self-pay | Admitting: Family

## 2014-12-28 ENCOUNTER — Ambulatory Visit (INDEPENDENT_AMBULATORY_CARE_PROVIDER_SITE_OTHER): Payer: Medicare Other | Admitting: Family

## 2014-12-28 VITALS — BP 132/92 | HR 76 | Temp 98.0°F | Resp 18 | Ht 66.0 in | Wt 135.8 lb

## 2014-12-28 DIAGNOSIS — Z7901 Long term (current) use of anticoagulants: Secondary | ICD-10-CM

## 2014-12-28 DIAGNOSIS — R221 Localized swelling, mass and lump, neck: Secondary | ICD-10-CM | POA: Diagnosis not present

## 2014-12-28 DIAGNOSIS — Z5181 Encounter for therapeutic drug level monitoring: Secondary | ICD-10-CM

## 2014-12-28 DIAGNOSIS — I4891 Unspecified atrial fibrillation: Secondary | ICD-10-CM

## 2014-12-28 LAB — POCT INR: INR: 2.7

## 2014-12-28 MED ORDER — SULFAMETHOXAZOLE-TRIMETHOPRIM 800-160 MG PO TABS: 1.0000 | ORAL_TABLET | Freq: Two times a day (BID) | ORAL | Status: DC

## 2014-12-28 NOTE — Assessment & Plan Note (Signed)
Mass of neck consistent with potential infected hair follicle or sebaccious cyst. Treat conservatively with bactrim to cover for MRSA. Patient is on coumadin and will recheck her INR in 3 days and make appropriate adjustments secondary to bactrim. Follow up if symptoms worsen, may need I/D.

## 2014-12-28 NOTE — Progress Notes (Signed)
Pre visit review using our clinic review tool, if applicable. No additional management support is needed unless otherwise documented below in the visit note. 

## 2014-12-28 NOTE — Patient Instructions (Addendum)
Thank you for choosing [facility].  Summary/Instructions:  Your prescription(s) have been submitted to your pharmacy or been printed and provided for you. Please take as directed and contact our office if you believe you are having problem(s) with the medication(s) or have any questions.  If your symptoms worsen or fail to improve, please contact our office for further instruction, or in case of emergency go directly to the emergency room at the closest medical facility.     

## 2014-12-28 NOTE — Progress Notes (Signed)
Subjective:    Patient ID: [name], female    DOB: March 27, 1934, 81 y.o.   MRN: [id number]  Chief Complaint  Patient presents with  . Mass    has a bump on the back of her neck x6 months, it has grown, started itching, and has gotten sore when it used to not be    HPI:  [name] is a 81 y.o. female with a PMH of hypertension, atrial fibrillation, hypercholesterolemia, anxiety, depression, and diverticulosis who presents today for an acute office visit.  Associated symptoms of a bump located on the back of her neck was first noticed about 6 months ago. Indicates that it has grown recently and has become itchy and sore. Modifying factors include OTC remedies including neosporin which soothes the itch, but does not change the size of the bump. Was previously evaluated and though to be a ingrown hair follicle.   No Known Allergies   Current Outpatient Prescriptions on File Prior to Visit  Medication Sig Dispense Refill  . atorvastatin (LIPITOR) 20 MG tablet take 1 tablet by mouth once daily 30 tablet 5  . calcium-vitamin D (OSCAL WITH D) 500-200 MG-UNIT per tablet Take 1 tablet by mouth daily.      . Cholecalciferol (VITAMIN D) 2000 UNITS CAPS Take 1 capsule by mouth daily.    . dorzolamide-timolol (COSOPT) 22.3-6.8 MG/ML ophthalmic solution Place 1 drop into both eyes 2 (two) times daily.    Marland Kitchen ibuprofen (ADVIL) 200 MG tablet as needed.      . latanoprost (XALATAN) 0.005 % ophthalmic solution Place 1 drop into both eyes at bedtime.     Marland Kitchen losartan-hydrochlorothiazide (HYZAAR) 50-12.5 MG per tablet take 1 tablet by mouth once daily 30 tablet 7  . metoprolol (LOPRESSOR) 100 MG tablet take 1 tablet by mouth twice a day 60 tablet 12  . Multiple Vitamin (MULTIVITAMIN) tablet Take 1 tablet by mouth daily.      . pilocarpine (PILOCAR) 1 % ophthalmic solution Place 1 drop into the left eye 2 (two) times daily.    . polyethylene glycol powder (MIRALAX) powder Take 17 g by mouth as  needed.     . potassium chloride (K-DUR,KLOR-CON) 10 MEQ tablet take 1 tablet by mouth once daily 30 tablet 3  . warfarin (COUMADIN) 2.5 MG tablet USE AS DIRECTED 90 tablet 3  . [DISCONTINUED] potassium chloride (K-DUR) 10 MEQ tablet Take 1 tablet (10 mEq total) by mouth daily. 30 tablet 2   No current facility-administered medications on file prior to visit.    Review of Systems  Constitutional: Negative for fever and chills.  Skin: Positive for rash.      Objective:    BP 132/92 mmHg  Pulse 76  Temp(Src) 98 F (36.7 C) (Oral)  Resp 18  Ht 5\' 6"  (1.676 m)  Wt 135 lb 12.8 oz (61.598 kg)  BMI 21.93 kg/m2  SpO2 96% Nursing note and vital signs reviewed.  Physical Exam  Constitutional: She is oriented to person, place, and time. She appears well-developed and well-nourished. No distress.  Cardiovascular: Normal rate, regular rhythm, normal heart sounds and intact distal pulses.   Pulmonary/Chest: Effort normal and breath sounds normal.  Neurological: She is alert and oriented to person, place, and time.  Skin: Skin is warm and dry.  2 x 1.5 cm nodule/cyst, firm and compressable, small hair follicle noted at center located on the right posterior occipital lobe under the hairline.   Psychiatric: She has a normal  mood and affect. Her behavior is normal. Judgment and thought content normal.       Assessment & Plan:

## 2014-12-31 ENCOUNTER — Telehealth: Payer: Self-pay | Admitting: Family

## 2014-12-31 ENCOUNTER — Ambulatory Visit: Payer: Self-pay

## 2014-12-31 ENCOUNTER — Other Ambulatory Visit (INDEPENDENT_AMBULATORY_CARE_PROVIDER_SITE_OTHER): Payer: Medicare Other

## 2014-12-31 ENCOUNTER — Ambulatory Visit (INDEPENDENT_AMBULATORY_CARE_PROVIDER_SITE_OTHER): Payer: Medicare Other

## 2014-12-31 DIAGNOSIS — Z5181 Encounter for therapeutic drug level monitoring: Secondary | ICD-10-CM | POA: Diagnosis not present

## 2014-12-31 DIAGNOSIS — I482 Chronic atrial fibrillation, unspecified: Secondary | ICD-10-CM

## 2014-12-31 LAB — PROTIME-INR
INR: 4.6 ratio — ABNORMAL HIGH (ref 0.8–1.0)
Prothrombin Time: 49.3 s — ABNORMAL HIGH (ref 9.6–13.1)

## 2014-12-31 NOTE — Telephone Encounter (Signed)
Talked with patient and advised patient to skip today's dose (5/6) and tomorrow's dose (5/7), and then continue with normal dosing starting on Sunday with 3 tablets--patient repeated instructions for understanding

## 2014-12-31 NOTE — Telephone Encounter (Signed)
Please instruct patient to skip the next dose of coumadin because her INR is 4.6.

## 2015-01-03 DIAGNOSIS — H4011X3 Primary open-angle glaucoma, severe stage: Secondary | ICD-10-CM | POA: Diagnosis not present

## 2015-01-03 DIAGNOSIS — Z9889 Other specified postprocedural states: Secondary | ICD-10-CM | POA: Diagnosis not present

## 2015-01-03 DIAGNOSIS — H4011X2 Primary open-angle glaucoma, moderate stage: Secondary | ICD-10-CM | POA: Diagnosis not present

## 2015-01-04 ENCOUNTER — Ambulatory Visit: Payer: Medicare Other | Admitting: Family

## 2015-01-05 ENCOUNTER — Other Ambulatory Visit (INDEPENDENT_AMBULATORY_CARE_PROVIDER_SITE_OTHER): Payer: Medicare Other

## 2015-01-05 ENCOUNTER — Ambulatory Visit (INDEPENDENT_AMBULATORY_CARE_PROVIDER_SITE_OTHER): Payer: Medicare Other | Admitting: Internal Medicine

## 2015-01-05 VITALS — BP 120/80 | HR 109 | Temp 98.3°F | Ht 66.0 in | Wt 134.5 lb

## 2015-01-05 DIAGNOSIS — Z7901 Long term (current) use of anticoagulants: Secondary | ICD-10-CM | POA: Diagnosis not present

## 2015-01-05 DIAGNOSIS — D489 Neoplasm of uncertain behavior, unspecified: Secondary | ICD-10-CM

## 2015-01-05 LAB — PROTIME-INR
INR: 2 ratio — AB (ref 0.8–1.0)
Prothrombin Time: 22.3 s — ABNORMAL HIGH (ref 9.6–13.1)

## 2015-01-05 MED ORDER — MUPIROCIN 2 % EX OINT: TOPICAL_OINTMENT | CUTANEOUS | Status: DC

## 2015-01-05 NOTE — Progress Notes (Signed)
Pre visit review using our clinic review tool, if applicable. No additional management support is needed unless otherwise documented below in the visit note. 

## 2015-01-05 NOTE — Patient Instructions (Signed)
The Surgery  referral will be scheduled and you'll be notified of the time.Please call the Referral Co-Ordinator @ [phone] if you have not been notified of appointment time within 7-10 days.

## 2015-01-05 NOTE — Progress Notes (Signed)
   Subjective:    Patient ID: [name], female    DOB: 01-07-1934, 81 y.o.   MRN: [id number]  HPI Today she had bleeding from a right posterior neck lesion which has been present 3-4 months. She believes that it has been enlarging. At the time of its appearance there was no specific etiologic trigger or injury noted.   On 5/3 she was placed on Septra to cover possible MRSA involving what was thought to be an epidermoid inclusion cyst or ingrown hair follicle. On 5.6 her PT/INR was 4.6. Her warfarin was held for 2 days; she discontinued the sulfa antibiotic on 5/8.     Review of Systems  No vesicles,purulence or urticaria noted.  Fever ,chills , or sweats denied.     Objective:   Physical Exam Pertinent or positive findings include: She ambulates with a cane. There is a 7 x 8 mm irregular white cystic lesion below the scalp line of the right posterior neck. There appears to be a granulomatous base. There is no active bleeding or purulence associated with this lesion. Heart rhythm is irregular. She has some lipedema of the ankles without definite pitting. Pedal pulses are decreased  General appearance :Thin but adequately nourished; in no distress. Eyes: No conjunctival inflammation or scleral icterus is present. Heart:  No gallop, murmur, click, rub or other extra sounds   Lungs:Chest clear to auscultation; no wheezes, rhonchi,rales ,or rubs present.No increased work of breathing.  Vascular : all pulses equal ; no bruits present. Skin:Warm & dry.  Intact without suspicious lesions or rashes ; no tenting  Lymphatic: No lymphadenopathy is noted about the head, neck, axilla Neuro: Strength, tone normal.         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

## 2015-01-06 ENCOUNTER — Other Ambulatory Visit: Payer: Self-pay | Admitting: Internal Medicine

## 2015-01-06 DIAGNOSIS — D489 Neoplasm of uncertain behavior, unspecified: Secondary | ICD-10-CM

## 2015-01-07 ENCOUNTER — Telehealth: Payer: Self-pay | Admitting: Internal Medicine

## 2015-01-07 ENCOUNTER — Ambulatory Visit: Payer: Medicare Other | Admitting: Family

## 2015-01-07 NOTE — Telephone Encounter (Signed)
May reschedule it she calls back.

## 2015-01-07 NOTE — Telephone Encounter (Signed)
Patient no showed for follow up.  Please advise.

## 2015-01-10 NOTE — Telephone Encounter (Signed)
noted 

## 2015-01-21 ENCOUNTER — Ambulatory Visit: Payer: Self-pay | Admitting: Surgery

## 2015-01-21 DIAGNOSIS — L723 Sebaceous cyst: Secondary | ICD-10-CM | POA: Diagnosis not present

## 2015-02-01 ENCOUNTER — Ambulatory Visit (INDEPENDENT_AMBULATORY_CARE_PROVIDER_SITE_OTHER): Payer: Medicare Other | Admitting: *Deleted

## 2015-02-01 DIAGNOSIS — Z7901 Long term (current) use of anticoagulants: Secondary | ICD-10-CM

## 2015-02-01 DIAGNOSIS — I4891 Unspecified atrial fibrillation: Secondary | ICD-10-CM

## 2015-02-01 LAB — POCT INR: INR: 2.1

## 2015-02-24 ENCOUNTER — Ambulatory Visit (INDEPENDENT_AMBULATORY_CARE_PROVIDER_SITE_OTHER): Payer: Medicare Other | Admitting: Internal Medicine

## 2015-02-24 ENCOUNTER — Encounter: Payer: Self-pay | Admitting: Internal Medicine

## 2015-02-24 VITALS — BP 124/80 | HR 73 | Temp 97.5°F | Ht 66.0 in | Wt 134.0 lb

## 2015-02-24 DIAGNOSIS — E7800 Pure hypercholesterolemia, unspecified: Secondary | ICD-10-CM

## 2015-02-24 DIAGNOSIS — I482 Chronic atrial fibrillation, unspecified: Secondary | ICD-10-CM

## 2015-02-24 DIAGNOSIS — K589 Irritable bowel syndrome without diarrhea: Secondary | ICD-10-CM | POA: Insufficient documentation

## 2015-02-24 DIAGNOSIS — I10 Essential (primary) hypertension: Secondary | ICD-10-CM | POA: Diagnosis not present

## 2015-02-24 DIAGNOSIS — H409 Unspecified glaucoma: Secondary | ICD-10-CM

## 2015-02-24 MED ORDER — POTASSIUM CHLORIDE CRYS ER 10 MEQ PO TBCR: 10.0000 meq | EXTENDED_RELEASE_TABLET | Freq: Every day | ORAL | Status: DC

## 2015-02-24 MED ORDER — LOSARTAN POTASSIUM-HCTZ 50-12.5 MG PO TABS: 1.0000 | ORAL_TABLET | Freq: Every day | ORAL | Status: DC

## 2015-02-24 MED ORDER — ATORVASTATIN CALCIUM 20 MG PO TABS: 20.0000 mg | ORAL_TABLET | Freq: Every day | ORAL | Status: DC

## 2015-02-24 NOTE — Assessment & Plan Note (Signed)
BP Readings from Last 3 Encounters:  02/24/15 124/80  01/05/15 120/80  12/28/14 132/92   The current medical regimen is effective;  continue present plan and medications.

## 2015-02-24 NOTE — Assessment & Plan Note (Signed)
Rate controlled on beta-blocker anticoag with coumadin at [facility] Last cards note reviewed

## 2015-02-24 NOTE — Assessment & Plan Note (Signed)
Reviewed strategies for managing same Prior GI evaluation reviewed - no red flags on hx, no unexpected weight changes

## 2015-02-24 NOTE — Progress Notes (Signed)
Pre visit review using our clinic review tool, if applicable. No additional management support is needed unless otherwise documented below in the visit note. 

## 2015-02-24 NOTE — Progress Notes (Signed)
Subjective:    Patient ID: [name], female    DOB: [birth year], 81 y.o.   MRN: [id number]  HPI  Patient here for follow up - reviewed chronic issues and interval events  Past Medical History  Diagnosis Date  . Hypertension   . Hyperlipidemia   . Glaucoma   . Cigarette smoker   . Diverticulosis of colon   . Family history of colon cancer   . Hemorrhoids   . Renal cyst   . DJD (degenerative joint disease)   . Osteopenia   . Vitamin D deficiency   . Restless leg syndrome   . Anxiety   . Depression   . Atrial fibrillation   . History of sebaceous cyst     R posterior scalp - observation elected 01/2015 after surgical eval    Review of Systems  Constitutional: Positive for fatigue. Negative for unexpected weight change.  Respiratory: Negative for cough and shortness of breath.   Cardiovascular: Negative for chest pain and leg swelling.  Gastrointestinal: Positive for diarrhea (each AM and after any po intake). Negative for abdominal pain, constipation and blood in stool.       Objective:    Physical Exam  Constitutional: She appears well-developed and well-nourished. No distress.  Cardiovascular: Normal rate and normal heart sounds.   No murmur heard. irreg irreg  Pulmonary/Chest: Effort normal and breath sounds normal. No respiratory distress.  Musculoskeletal: She exhibits no edema.    BP 124/80 mmHg  Pulse 73  Temp(Src) 97.5 F (36.4 C) (Oral)  Ht 5\' 6"  (1.676 m)  Wt 134 lb (60.782 kg)  BMI 21.64 kg/m2  SpO2 98% Wt Readings from Last 3 Encounters:  02/24/15 134 lb (60.782 kg)  01/05/15 134 lb 8 oz (61.009 kg)  12/28/14 135 lb 12.8 oz (61.598 kg)    Lab Results  Component Value Date   WBC 4.2 06/23/2014   HGB 14.4 06/23/2014   HCT 42.8 06/23/2014   PLT 170 06/23/2014   GLUCOSE 92 06/23/2014   CHOL 140 06/23/2014   TRIG 63 06/23/2014   HDL 60 06/23/2014   LDLCALC 67 06/23/2014   ALT 23 06/23/2014   AST 26 06/23/2014   NA 142 06/23/2014     K 4.1 06/23/2014   CL 108 06/23/2014   CREATININE 0.92 06/23/2014   BUN 21 06/23/2014   CO2 25 06/23/2014   TSH 0.85 06/22/2013   INR 2.1 02/01/2015    Dg Chest 2 View  12/24/2013   CLINICAL DATA:  Left lateral rib pain  EXAM: CHEST  2 VIEW  COMPARISON:  None.  FINDINGS: The lungs are hyperinflated likely secondary to COPD. There is no focal parenchymal opacity, pleural effusion, or pneumothorax. The heart and mediastinal contours are unremarkable.  There is osteoarthritis of the right glenohumeral joint. There is mild thoracic spine spondylosis.  IMPRESSION: No active cardiopulmonary disease.   Electronically Signed   By: Kathreen Devoid   On: 12/24/2013 10:52       Assessment & Plan:   Problem List Items Addressed This Visit    Atrial fibrillation - Primary    Rate controlled on beta-blocker anticoag with coumadin at [facility] Last cards note reviewed      Relevant Medications   atorvastatin (LIPITOR) 20 MG tablet   losartan-hydrochlorothiazide (HYZAAR) 50-12.5 MG per tablet   Essential hypertension    BP Readings from Last 3 Encounters:  02/24/15 124/80  01/05/15 120/80  12/28/14 132/92   The current medical regimen is  effective;  continue present plan and medications.      Relevant Medications   atorvastatin (LIPITOR) 20 MG tablet   losartan-hydrochlorothiazide (HYZAAR) 50-12.5 MG per tablet   Glaucoma    Follows regularly with ophthalmology for same, denies vision changes or medication changes      HYPERCHOLESTEROLEMIA    On atorva for years tolerated well - reviewed last labs The current medical regimen is effective;  continue present plan and medications.       Relevant Medications   atorvastatin (LIPITOR) 20 MG tablet   losartan-hydrochlorothiazide (HYZAAR) 50-12.5 MG per tablet   Irritable bowel syndrome (IBS)    Reviewed strategies for managing same Prior GI evaluation reviewed - no red flags on hx, no unexpected weight changes        Time spent with pt  today 25 minutes, greater than 50% time spent counseling patient on hypertension, dyslipidemia, atrial fibrillation, ophthalmology evaluation and medication review. Also review of prior records   Gwendolyn Grant, MD

## 2015-02-24 NOTE — Assessment & Plan Note (Signed)
On atorva for years tolerated well - reviewed last labs The current medical regimen is effective;  continue present plan and medications.

## 2015-02-24 NOTE — Assessment & Plan Note (Signed)
Follows regularly with ophthalmology for same, denies vision changes or medication changes

## 2015-02-24 NOTE — Patient Instructions (Addendum)
It was good to see you today.  We have reviewed your prior records including labs and tests today  Medications reviewed and updated, no changes recommended at this time. Refill on medication(s) as discussed today.  Continue working with your other specialists as ongoing  Please schedule followup in 6 months, call sooner if problems.   Irritable Bowel Syndrome Irritable bowel syndrome (IBS) is caused by a disturbance of normal bowel function and is a common digestive disorder. You may also hear this condition called spastic colon, mucous colitis, and irritable colon. There is no cure for IBS. However, symptoms often gradually improve or disappear with a good diet, stress management, and medicine. This condition usually appears in late adolescence or early adulthood. Women develop it twice as often as men. CAUSES  After food has been digested and absorbed in the small intestine, waste material is moved into the large intestine, or colon. In the colon, water and salts are absorbed from the undigested products coming from the small intestine. The remaining residue, or fecal material, is held for elimination. Under normal circumstances, gentle, rhythmic contractions of the bowel walls push the fecal material along the colon toward the rectum. In IBS, however, these contractions are irregular and poorly coordinated. The fecal material is either retained too long, resulting in constipation, or expelled too soon, producing diarrhea. SIGNS AND SYMPTOMS  The most common symptom of IBS is abdominal pain. It is often in the lower left side of the abdomen, but it may occur anywhere in the abdomen. The pain comes from spasms of the bowel muscles happening too much and from the buildup of gas and fecal material in the colon. This pain:  Can range from sharp abdominal cramps to a dull, continuous ache.  Often worsens soon after eating.  Is often relieved by having a bowel movement or passing gas. Abdominal  pain is usually accompanied by constipation, but it may also produce diarrhea. The diarrhea often occurs right after a meal or upon waking up in the morning. The stools are often soft, watery, and flecked with mucus. Other symptoms of IBS include:  Bloating.  Loss of appetite.  Heartburn.  Backache.  Dull pain in the arms or shoulders.  Nausea.  Burping.  Vomiting.  Gas. IBS may also cause symptoms that are unrelated to the digestive system, such as:  Fatigue.  Headaches.  Anxiety.  Shortness of breath.  Trouble concentrating.  Dizziness. These symptoms tend to come and go. DIAGNOSIS  The symptoms of IBS may seem like symptoms of other, more serious digestive disorders. Your health care provider may want to perform tests to exclude these disorders.  TREATMENT Many medicines are available to help correct bowel function or relieve bowel spasms and abdominal pain. Among the medicines available are:  Laxatives for severe constipation and to help restore normal bowel habits.  Specific antidiarrheal medicines to treat severe or lasting diarrhea.  Antispasmodic agents to relieve intestinal cramps. Your health care provider may also decide to treat you with a mild tranquilizer or sedative during unusually stressful periods in your life. Your health care provider may also prescribe antidepressant medicine. The use of this medicine has been shown to reduce pain and other symptoms of IBS. Remember that if any medicine is prescribed for you, you should take it exactly as directed. Make sure your health care provider knows how well it worked for you. HOME CARE INSTRUCTIONS   Take all medicines as directed by your health care provider.  Avoid foods  that are high in fat or oils, such as heavy cream, butter, frankfurters, sausage, and other fatty meats.  Avoid foods that make you go to the bathroom, such as fruit, fruit juice, and dairy products.  Cut out carbonated drinks,  chewing gum, and "gassy" foods such as beans and cabbage. This may help relieve bloating and burping.  Eat foods with bran, and drink plenty of liquids with the bran foods. This helps relieve constipation.  Keep track of what foods seem to bring on your symptoms.  Avoid emotionally charged situations or circumstances that produce anxiety.  Start or continue exercising.  Get plenty of rest and sleep. Document Released: 08/13/2005 Document Revised: 08/18/2013 Document Reviewed: 04/02/2008 [facility] Patient Information 2015 [place], [place]. This information is not intended to replace advice given to you by your health care provider. Make sure you discuss any questions you have with your health care provider.

## 2015-03-01 ENCOUNTER — Ambulatory Visit (INDEPENDENT_AMBULATORY_CARE_PROVIDER_SITE_OTHER): Payer: Medicare Other | Admitting: *Deleted

## 2015-03-01 DIAGNOSIS — Z7901 Long term (current) use of anticoagulants: Secondary | ICD-10-CM | POA: Diagnosis not present

## 2015-03-01 DIAGNOSIS — I4891 Unspecified atrial fibrillation: Secondary | ICD-10-CM | POA: Diagnosis not present

## 2015-03-01 LAB — POCT INR: INR: 2.8

## 2015-03-05 ENCOUNTER — Other Ambulatory Visit: Payer: Self-pay | Admitting: Cardiology

## 2015-03-29 ENCOUNTER — Ambulatory Visit (INDEPENDENT_AMBULATORY_CARE_PROVIDER_SITE_OTHER): Payer: Medicare Other | Admitting: *Deleted

## 2015-03-29 DIAGNOSIS — I4891 Unspecified atrial fibrillation: Secondary | ICD-10-CM | POA: Diagnosis not present

## 2015-03-29 DIAGNOSIS — Z7901 Long term (current) use of anticoagulants: Secondary | ICD-10-CM | POA: Diagnosis not present

## 2015-03-29 LAB — POCT INR: INR: 2.4

## 2015-04-11 ENCOUNTER — Telehealth: Payer: Self-pay | Admitting: Cardiology

## 2015-04-11 NOTE — Telephone Encounter (Signed)
Spoke with [name], Aware of dr Jacalyn Lefevre recommendations.

## 2015-04-11 NOTE — Telephone Encounter (Signed)
Would continue coumadin unless dentist requires to stop. If so, dc 5 d prior and resume day of [name]

## 2015-04-11 NOTE — Telephone Encounter (Signed)
Spoke to caller.  Extraction is for 2 front lower teeth.  Indicated the teeth are mobile, should be uncomplicated extraction.  Will route to Dr. Stanford Breed, see if d/c of warfarin recommended, & if o/w OK for procedure.

## 2015-04-11 NOTE — Telephone Encounter (Signed)
New message    Request for surgical clearance:  1. What type of surgery is being performed? Teeth extraction  2. When is this surgery scheduled? Not scheduled at this time  3. Are there any medications that need to be held prior to surgery and how long?Blood thinner and not sure how many days   4. Name of physician performing surgery? Dr Daneen Schick   5. What is your office phone and fax number? [facility] [phone]   Fax [phone]

## 2015-05-10 ENCOUNTER — Ambulatory Visit (INDEPENDENT_AMBULATORY_CARE_PROVIDER_SITE_OTHER): Payer: Medicare Other | Admitting: *Deleted

## 2015-05-10 DIAGNOSIS — I4891 Unspecified atrial fibrillation: Secondary | ICD-10-CM

## 2015-05-10 DIAGNOSIS — Z7901 Long term (current) use of anticoagulants: Secondary | ICD-10-CM

## 2015-05-10 LAB — POCT INR: INR: 3

## 2015-05-25 NOTE — Progress Notes (Signed)
HPI: FU atrial fibrillation. She has a hx of s/p failed DCCV in 8/13, HTN, HL, tobacco abuse. Echo 01/2012: EF 55-65%, normal wall motion, grade 2 diastolic dysfunction, mild MR. Holter 10/14 showed afib, rate controlled. Since last seen, she has some dyspnea on exertion but no orthopnea, PND or pedal edema. No chest pain or syncope. No bleeding.  Current Outpatient Prescriptions  Medication Sig Dispense Refill  . atorvastatin (LIPITOR) 20 MG tablet Take 1 tablet (20 mg total) by mouth daily at 6 PM. 30 tablet 11  . Cholecalciferol (VITAMIN D) 2000 UNITS CAPS Take 1 capsule by mouth daily.    . dorzolamide-timolol (COSOPT) 22.3-6.8 MG/ML ophthalmic solution Place 1 drop into both eyes 2 (two) times daily.    Marland Kitchen ibuprofen (ADVIL) 200 MG tablet as needed.      . latanoprost (XALATAN) 0.005 % ophthalmic solution Place 1 drop into both eyes at bedtime.     Marland Kitchen losartan-hydrochlorothiazide (HYZAAR) 50-12.5 MG per tablet Take 1 tablet by mouth daily. 30 tablet 11  . metoprolol (LOPRESSOR) 100 MG tablet take 1 tablet by mouth twice a day 60 tablet 12  . pilocarpine (PILOCAR) 1 % ophthalmic solution Place 1 drop into the left eye 2 (two) times daily.    . potassium chloride (K-DUR,KLOR-CON) 10 MEQ tablet Take 1 tablet (10 mEq total) by mouth daily. 30 tablet 3  . warfarin (COUMADIN) 2.5 MG tablet Take 2-3 tablets by mouth daily as directed by coumadin clinic 90 tablet 3  . [DISCONTINUED] potassium chloride (K-DUR) 10 MEQ tablet Take 1 tablet (10 mEq total) by mouth daily. 30 tablet 2   No current facility-administered medications for this visit.     Past Medical History  Diagnosis Date  . Hypertension   . Hyperlipidemia   . Glaucoma   . Cigarette smoker   . Diverticulosis of colon   . Family history of colon cancer   . Hemorrhoids   . Renal cyst   . DJD (degenerative joint disease)   . Osteopenia   . Vitamin D deficiency   . Restless leg syndrome   . Anxiety   . Depression   .  Atrial fibrillation   . History of sebaceous cyst     R posterior scalp - observation elected 01/2015 after surgical eval    Past Surgical History  Procedure Laterality Date  . Total hip arthroplasty  2/05    right by Dr. Tonita Cong  . Cataract extraction    . Upper gastrointestinal endoscopy    . Colonoscopy    . Retina surgery x 2  2013  . Cardioversion  04/23/2012    Procedure: CARDIOVERSION;  Surgeon: Hillary Bow, MD;  Location: [facility];  Service: Cardiovascular;  Laterality: N/A;    Social History   Social History  . Marital Status: Widowed    Spouse Name: N/A  . Number of Children: 0  . Years of Education: N/A   Occupational History  . Retired    Social History Main Topics  . Smoking status: Light Tobacco Smoker -- 0.30 packs/day  . Smokeless tobacco: Never Used     Comment: 6 cig a day  . Alcohol Use: No  . Drug Use: No  . Sexual Activity: Not Currently   Other Topics Concern  . Not on file   Social History Narrative   Exercises 2 times per week   Caffeine use: 1 cup coffee per day   Lives alone, widowed 1978  ROS: no fevers or chills, productive cough, hemoptysis, dysphasia, odynophagia, melena, hematochezia, dysuria, hematuria, rash, seizure activity, orthopnea, PND, pedal edema, claudication. Remaining systems are negative.  Physical Exam: Well-developed well-nourished in no acute distress.  Skin is warm and dry.  HEENT is normal.  Neck is supple.  Chest diminished breath sounds throughout Cardiovascular exam is irregular Abdominal exam nontender or distended. No masses palpated. Extremities show no edema. neuro grossly intact  ECG atrial fibrillation at a rate of 90. Nonspecific ST changes.

## 2015-05-27 ENCOUNTER — Ambulatory Visit (INDEPENDENT_AMBULATORY_CARE_PROVIDER_SITE_OTHER): Payer: Medicare Other | Admitting: Cardiology

## 2015-05-27 ENCOUNTER — Encounter: Payer: Self-pay | Admitting: Cardiology

## 2015-05-27 VITALS — BP 122/70 | HR 90 | Ht 66.0 in | Wt 132.0 lb

## 2015-05-27 DIAGNOSIS — I10 Essential (primary) hypertension: Secondary | ICD-10-CM | POA: Diagnosis not present

## 2015-05-27 DIAGNOSIS — I4891 Unspecified atrial fibrillation: Secondary | ICD-10-CM | POA: Diagnosis not present

## 2015-05-27 DIAGNOSIS — Z23 Encounter for immunization: Secondary | ICD-10-CM

## 2015-05-27 LAB — BASIC METABOLIC PANEL
BUN: 20 mg/dL (ref 7–25)
CO2: 24 mmol/L (ref 20–31)
CREATININE: 0.92 mg/dL (ref 0.60–0.93)
Calcium: 9.4 mg/dL (ref 8.6–10.4)
Chloride: 106 mmol/L (ref 98–110)
GLUCOSE: 83 mg/dL (ref 65–99)
Potassium: 4.2 mmol/L (ref 3.5–5.3)
Sodium: 140 mmol/L (ref 135–146)

## 2015-05-27 LAB — CBC
HCT: 43.4 % (ref 36.0–46.0)
HEMOGLOBIN: 14.5 g/dL (ref 12.0–15.0)
MCH: 30.3 pg (ref 26.0–34.0)
MCHC: 33.4 g/dL (ref 30.0–36.0)
MCV: 90.8 fL (ref 78.0–100.0)
MPV: 10 fL (ref 8.6–12.4)
Platelets: 167 10*3/uL (ref 150–400)
RBC: 4.78 MIL/uL (ref 3.87–5.11)
RDW: 13.9 % (ref 11.5–15.5)
WBC: 4 10*3/uL (ref 4.0–10.5)

## 2015-05-27 NOTE — Patient Instructions (Signed)
Your physician wants you to follow-up in: [place] will receive a reminder letter in the mail two months in advance. If you don't receive a letter, please call our office to schedule the follow-up appointment.   Your physician recommends that you HAVE LAB WORK TODAY  ELIQUIS TO REPLACE WARFARIN

## 2015-05-27 NOTE — Assessment & Plan Note (Signed)
Blood pressure controlled. Continue present medications. Check potassium and renal function. 

## 2015-05-27 NOTE — Assessment & Plan Note (Signed)
Patient counseled on discontinuing. 

## 2015-05-27 NOTE — Assessment & Plan Note (Signed)
Patient remains in permanent atrial fibrillation. Continue beta blocker for rate control. Continue Coumadin. Check hemoglobin and renal function. She would like to consider changing to apixaban. She will check the cost of this medication and notify us if she would like to change.

## 2015-05-27 NOTE — Assessment & Plan Note (Signed)
Continue statin. 

## 2015-06-14 NOTE — Addendum Note (Signed)
Addended by: Diana Eves on: 06/14/2015 01:48 PM   Modules accepted: Orders

## 2015-06-21 ENCOUNTER — Ambulatory Visit (INDEPENDENT_AMBULATORY_CARE_PROVIDER_SITE_OTHER): Payer: Medicare Other | Admitting: *Deleted

## 2015-06-21 DIAGNOSIS — I4891 Unspecified atrial fibrillation: Secondary | ICD-10-CM | POA: Diagnosis not present

## 2015-06-21 DIAGNOSIS — Z7901 Long term (current) use of anticoagulants: Secondary | ICD-10-CM | POA: Diagnosis not present

## 2015-06-21 LAB — POCT INR: INR: 2.1

## 2015-06-21 MED ORDER — APIXABAN 5 MG PO TABS: 5.0000 mg | ORAL_TABLET | Freq: Two times a day (BID) | ORAL | Status: DC

## 2015-06-22 ENCOUNTER — Other Ambulatory Visit: Payer: Self-pay | Admitting: Pharmacist Clinician (PhC)/ Clinical Pharmacy Specialist

## 2015-06-22 NOTE — Telephone Encounter (Signed)
Spoke with pt and instructed her to hold coumadin today October 26th  and start Eliquis 5mg  tomorrow am and take at 8am and 8pm . Pt instructed regarding Eliquis and precautions to take and appt made for 1 month follow up  Pt was started on Eliquis 5mg  bid  for Atrial Fib  on October 27,2016.    Reviewed patients medication list.  Pt is not currently on any combined P-gp and strong CYP3A4 inhibitors/inducers (ketoconazole, traconazole, ritonavir, carbamazepine, phenytoin, rifampin, St. John's wort).  Reviewed labs.  SCr 0.92 ( 05/27/2015) , Weight 59.875kg.  Dose is  appropriate based on Age Weight and SrCr.   Hgb 14.4 and HCT 43.4  A full discussion of the nature of anticoagulants has been carried out.  A benefit/risk analysis has been presented to the patient, so that they understand the justification for choosing anticoagulation with Eliquis at this time.  The need for compliance is stressed.  Pt is aware to take the medication twice daily.  Side effects of potential bleeding are discussed, including unusual colored urine or stools, coughing up blood or coffee ground emesis, nose bleeds or serious fall or head trauma.  Discussed signs and symptoms of stroke. The patient should avoid any OTC items containing aspirin or ibuprofen.  Avoid alcohol consumption.   Call if any signs of abnormal bleeding.  Discussed financial obligations and pt states she can obtain Eliquis for 10 dollars a month.  Next lab test test in 1 month.

## 2015-06-22 NOTE — Telephone Encounter (Signed)
Follow up      Pt has decided to get the eliquis.  When should she stop the coumadin and start taking the eliquis?  She forgot to ask yesterday during her coumadin visit.  Please call

## 2015-06-22 NOTE — Telephone Encounter (Signed)
Hgb 14.4 and HCT 43.4 done on 05/27/2015 Appt made for 1 month and pt given our phone number to call with any questions Pt states understanding of these instructions.

## 2015-06-22 NOTE — Telephone Encounter (Signed)
New Message:  Pt called in stating that she would like for the Eliquis to be called in to her pharmacy. She wanted to check on the price before having it called in. I informed her that the prescription has already been received by the pharmacy and she said that she called to the office and had that order cancelled. Please f/u with her   Thanks

## 2015-06-23 ENCOUNTER — Ambulatory Visit (INDEPENDENT_AMBULATORY_CARE_PROVIDER_SITE_OTHER): Payer: Medicare Other | Admitting: Internal Medicine

## 2015-06-23 NOTE — Telephone Encounter (Signed)
Hgb 14.5 and HCT 43.4 done on 05/27/2015 Title of note changed from refill to medication management

## 2015-06-23 NOTE — Progress Notes (Signed)
On October 26th  spoke with pt as she has decided to go with Eliquis and stop coumadin and instructed her to hold coumadin on October 26th and start Eliquis 5mg  on October 27th 2016 8am  and take at 8am and 8pm . Pt instructed regarding Eliquis and precautions to take and appt made for 1 month follow up  Pt was started on Eliquis 5mg  bid for Atrial Fib on October 27,2016.   Reviewed patients medication list. Pt is not currently on any combined P-gp and strong CYP3A4 inhibitors/inducers (ketoconazole, traconazole, ritonavir, carbamazepine, phenytoin, rifampin, St. John's wort). Reviewed labs. SCr 0.92 ( 05/27/2015) , Weight 59.875kg. Dose is appropriate based on Age Weight and SrCr. Hgb 14.5 and HCT 43.4 labs done on 05/27/2015  A full discussion of the nature of anticoagulants has been carried out. A benefit/risk analysis has been presented to the patient, so that they understand the justification for choosing anticoagulation with Eliquis at this time. The need for compliance is stressed. Pt is aware to take the medication twice daily. Side effects of potential bleeding are discussed, including unusual colored urine or stools, coughing up blood or coffee ground emesis, nose bleeds or serious fall or head trauma. Discussed signs and symptoms of stroke. The patient should avoid any OTC items containing aspirin or ibuprofen. Avoid alcohol consumption. Call if any signs of abnormal bleeding. Discussed financial obligations and pt states she can obtain Eliquis for 10 dollars a month. Next lab test test in 1 month.  Appt made for return visit in 1 month

## 2015-06-24 ENCOUNTER — Encounter: Payer: Self-pay | Admitting: *Deleted

## 2015-06-30 ENCOUNTER — Telehealth: Payer: Self-pay | Admitting: Internal Medicine

## 2015-06-30 NOTE — Telephone Encounter (Signed)
Patient states she should not have had appointment scheduled on 5/13.  She would like the no show fee refunded.

## 2015-07-04 ENCOUNTER — Other Ambulatory Visit: Payer: Self-pay | Admitting: Cardiology

## 2015-07-11 DIAGNOSIS — H401113 Primary open-angle glaucoma, right eye, severe stage: Secondary | ICD-10-CM | POA: Diagnosis not present

## 2015-07-11 DIAGNOSIS — H401122 Primary open-angle glaucoma, left eye, moderate stage: Secondary | ICD-10-CM | POA: Diagnosis not present

## 2015-07-18 ENCOUNTER — Ambulatory Visit (INDEPENDENT_AMBULATORY_CARE_PROVIDER_SITE_OTHER): Payer: Medicare Other | Admitting: *Deleted

## 2015-07-18 DIAGNOSIS — Z7901 Long term (current) use of anticoagulants: Secondary | ICD-10-CM | POA: Diagnosis not present

## 2015-07-18 DIAGNOSIS — I4891 Unspecified atrial fibrillation: Secondary | ICD-10-CM

## 2015-07-18 LAB — CBC
HCT: 41.4 % (ref 36.0–46.0)
HEMOGLOBIN: 13.8 g/dL (ref 12.0–15.0)
MCH: 30.6 pg (ref 26.0–34.0)
MCHC: 33.3 g/dL (ref 30.0–36.0)
MCV: 91.8 fL (ref 78.0–100.0)
MPV: 10.2 fL (ref 8.6–12.4)
PLATELETS: 158 10*3/uL (ref 150–400)
RBC: 4.51 MIL/uL (ref 3.87–5.11)
RDW: 14 % (ref 11.5–15.5)
WBC: 3.4 10*3/uL — ABNORMAL LOW (ref 4.0–10.5)

## 2015-07-18 LAB — BASIC METABOLIC PANEL
BUN: 12 mg/dL (ref 7–25)
CHLORIDE: 107 mmol/L (ref 98–110)
CO2: 28 mmol/L (ref 20–31)
Calcium: 9.3 mg/dL (ref 8.6–10.4)
Creat: 0.92 mg/dL (ref 0.60–0.93)
Glucose, Bld: 59 mg/dL — ABNORMAL LOW (ref 65–99)
Potassium: 3.9 mmol/L (ref 3.5–5.3)
SODIUM: 143 mmol/L (ref 135–146)

## 2015-07-19 NOTE — Progress Notes (Signed)
Pt was started on Eliquis 5mg s BID for Afib  on 06/23/15.    Reviewed patients medication list.  Pt is not  currently on any combined P-gp and strong CYP3A4 inhibitors/inducers (ketoconazole, traconazole, ritonavir, carbamazepine, phenytoin, rifampin, St. John's wort).  Reviewed labs:   SCr 0.92,  Weight 61.2 Kg, Age 81 yrs old.   Dose appropriate based on specified criteria, will recheck pt in 16mths .   Hgb and HCT 13.8/41.4.   A full discussion of the nature of anticoagulants has been carried out.  A benefit/risk analysis has been presented to the patient, so that they understand the justification for choosing anticoagulation with Eliquis at this time.  The need for compliance is stressed.  Pt is aware to take the medication twice daily.  Side effects of potential bleeding are discussed, including unusual colored urine or stools, coughing up blood or coffee ground emesis, nose bleeds or serious fall or head trauma.  Discussed signs and symptoms of stroke. The patient should avoid any OTC items containing aspirin or ibuprofen.  Avoid alcohol consumption.   Call if any signs of abnormal bleeding.  Discussed financial obligations and resolved any difficulty in obtaining medication.  Next lab test test in 6 months on 01/16/15 @10am .

## 2015-08-17 ENCOUNTER — Ambulatory Visit (INDEPENDENT_AMBULATORY_CARE_PROVIDER_SITE_OTHER): Payer: Medicare Other | Admitting: Internal Medicine

## 2015-08-17 ENCOUNTER — Encounter: Payer: Self-pay | Admitting: Internal Medicine

## 2015-08-17 ENCOUNTER — Other Ambulatory Visit (INDEPENDENT_AMBULATORY_CARE_PROVIDER_SITE_OTHER): Payer: Medicare Other

## 2015-08-17 VITALS — BP 124/76 | HR 90 | Temp 97.8°F | Resp 18 | Ht 66.0 in | Wt 134.1 lb

## 2015-08-17 DIAGNOSIS — Z23 Encounter for immunization: Secondary | ICD-10-CM

## 2015-08-17 DIAGNOSIS — I10 Essential (primary) hypertension: Secondary | ICD-10-CM

## 2015-08-17 DIAGNOSIS — I480 Paroxysmal atrial fibrillation: Secondary | ICD-10-CM

## 2015-08-17 DIAGNOSIS — Z0000 Encounter for general adult medical examination without abnormal findings: Secondary | ICD-10-CM

## 2015-08-17 DIAGNOSIS — K589 Irritable bowel syndrome without diarrhea: Secondary | ICD-10-CM

## 2015-08-17 DIAGNOSIS — F32A Depression, unspecified: Secondary | ICD-10-CM

## 2015-08-17 DIAGNOSIS — F329 Major depressive disorder, single episode, unspecified: Secondary | ICD-10-CM

## 2015-08-17 LAB — BASIC METABOLIC PANEL
BUN: 15 mg/dL (ref 6–23)
CALCIUM: 9.8 mg/dL (ref 8.4–10.5)
CO2: 27 mEq/L (ref 19–32)
Chloride: 107 mEq/L (ref 96–112)
Creatinine, Ser: 0.92 mg/dL (ref 0.40–1.20)
GFR: 75.59 mL/min (ref >60.00)
GLUCOSE: 90 mg/dL (ref 70–99)
Potassium: 4.1 mEq/L (ref 3.5–5.1)
SODIUM: 143 meq/L (ref 135–145)

## 2015-08-17 LAB — LIPID PANEL
Cholesterol: 137 mg/dL (ref 0–200)
HDL: 57.9 mg/dL (ref >39.00)
LDL Cholesterol: 64 mg/dL (ref 0–99)
NONHDL: 79.06
Total CHOL/HDL Ratio: 2
Triglycerides: 73 mg/dL (ref 0.0–149.0)
VLDL: 14.6 mg/dL (ref 0.0–40.0)

## 2015-08-17 LAB — TSH: TSH: 0.83 u[IU]/mL (ref 0.35–4.50)

## 2015-08-17 MED ORDER — PAROXETINE HCL 10 MG PO TABS: 10.0000 mg | ORAL_TABLET | Freq: Every day | ORAL | Status: DC

## 2015-08-17 NOTE — Assessment & Plan Note (Signed)
BP Readings from Last 3 Encounters:  08/17/15 124/76  05/27/15 122/70  02/24/15 124/80   The current medical regimen is effective;  continue present plan and medications.

## 2015-08-17 NOTE — Assessment & Plan Note (Signed)
Reviewed strategies for managing same - constant urge to defecate persists D>C symptoms prevail Prior GI evaluation reviewed - no red flags on hx, no unexpected weight changes Will start low dose Paxil for this condition - see "depression" discussion above

## 2015-08-17 NOTE — Progress Notes (Signed)
Pre visit review using our clinic review tool, if applicable. No additional management support is needed unless otherwise documented below in the visit note. 

## 2015-08-17 NOTE — Patient Instructions (Addendum)
It was good to see you today.  We have reviewed your prior records including labs and tests today  Pneumovax pneumonia vaccine updated today. Other Health Maintenance reviewed - all other recommended immunizations and age-appropriate screenings are up-to-date.  Test(s) ordered today. Your results will be released to [facility] (or called to you) after review, usually within 72hours after test completion. If any changes need to be made, you will be notified at that same time.  Medications reviewed and updated Will start low-dose generic Paxil for mood, appetite, energy and irritable bowel symptoms. No other changes recommended at this time.  Please schedule followup in 3 months for symptoms and medication recheck with new PCP Dr. Quay Burow, call sooner if problems.   Health Maintenance, Female Adopting a healthy lifestyle and getting preventive care can go a long way to promote health and wellness. Talk with your health care provider about what schedule of regular examinations is right for you. This is a good chance for you to check in with your provider about disease prevention and staying healthy. In between checkups, there are plenty of things you can do on your own. Experts have done a lot of research about which lifestyle changes and preventive measures are most likely to keep you healthy. Ask your health care provider for more information. WEIGHT AND DIET  Eat a healthy diet  Be sure to include plenty of vegetables, fruits, low-fat dairy products, and lean protein.  Do not eat a lot of foods high in solid fats, added sugars, or salt.  Get regular exercise. This is one of the most important things you can do for your health.  Most adults should exercise for at least 150 minutes each week. The exercise should increase your heart rate and make you sweat (moderate-intensity exercise).  Most adults should also do strengthening exercises at least twice a week. This is in addition to the  moderate-intensity exercise.  Maintain a healthy weight  Body mass index (BMI) is a measurement that can be used to identify possible weight problems. It estimates body fat based on height and weight. Your health care provider can help determine your BMI and help you achieve or maintain a healthy weight.  For females 40 years of age and older:   A BMI below 18.5 is considered underweight.  A BMI of 18.5 to 24.9 is normal.  A BMI of 25 to 29.9 is considered overweight.  A BMI of 30 and above is considered obese.  Watch levels of cholesterol and blood lipids  You should start having your blood tested for lipids and cholesterol at 79 years of age, then have this test every 5 years.  You may need to have your cholesterol levels checked more often if:  Your lipid or cholesterol levels are high.  You are older than 79 years of age.  You are at high risk for heart disease.  CANCER SCREENING   Lung Cancer  Lung cancer screening is recommended for adults 74-61 years old who are at high risk for lung cancer because of a history of smoking.  A yearly low-dose CT scan of the lungs is recommended for people who:  Currently smoke.  Have quit within the past 15 years.  Have at least a 30-pack-year history of smoking. A pack year is smoking an average of one pack of cigarettes a day for 1 year.  Yearly screening should continue until it has been 15 years since you quit.  Yearly screening should stop if you develop  a health problem that would prevent you from having lung cancer treatment.  Breast Cancer  Practice breast self-awareness. This means understanding how your breasts normally appear and feel.  It also means doing regular breast self-exams. Let your health care provider know about any changes, no matter how small.  If you are in your 20s or 30s, you should have a clinical breast exam (CBE) by a health care provider every 1-3 years as part of a regular health exam.  If  you are 43 or older, have a CBE every year. Also consider having a breast X-ray (mammogram) every year.  If you have a family history of breast cancer, talk to your health care provider about genetic screening.  If you are at high risk for breast cancer, talk to your health care provider about having an MRI and a mammogram every year.  Breast cancer gene (BRCA) assessment is recommended for women who have family members with BRCA-related cancers. BRCA-related cancers include:  Breast.  Ovarian.  Tubal.  Peritoneal cancers.  Results of the assessment will determine the need for genetic counseling and BRCA1 and BRCA2 testing. Cervical Cancer Your health care provider may recommend that you be screened regularly for cancer of the pelvic organs (ovaries, uterus, and vagina). This screening involves a pelvic examination, including checking for microscopic changes to the surface of your cervix (Pap test). You may be encouraged to have this screening done every 3 years, beginning at age 49.  For women ages 9-65, health care providers may recommend pelvic exams and Pap testing every 3 years, or they may recommend the Pap and pelvic exam, combined with testing for human papilloma virus (HPV), every 5 years. Some types of HPV increase your risk of cervical cancer. Testing for HPV may also be done on women of any age with unclear Pap test results.  Other health care providers may not recommend any screening for nonpregnant women who are considered low risk for pelvic cancer and who do not have symptoms. Ask your health care provider if a screening pelvic exam is right for you.  If you have had past treatment for cervical cancer or a condition that could lead to cancer, you need Pap tests and screening for cancer for at least 20 years after your treatment. If Pap tests have been discontinued, your risk factors (such as having a new sexual partner) need to be reassessed to determine if screening should  resume. Some women have medical problems that increase the chance of getting cervical cancer. In these cases, your health care provider may recommend more frequent screening and Pap tests. Colorectal Cancer  This type of cancer can be detected and often prevented.  Routine colorectal cancer screening usually begins at 79 years of age and continues through 79 years of age.  Your health care provider may recommend screening at an earlier age if you have risk factors for colon cancer.  Your health care provider may also recommend using home test kits to check for hidden blood in the stool.  A small camera at the end of a tube can be used to examine your colon directly (sigmoidoscopy or colonoscopy). This is done to check for the earliest forms of colorectal cancer.  Routine screening usually begins at age 18.  Direct examination of the colon should be repeated every 5-10 years through 79 years of age. However, you may need to be screened more often if early forms of precancerous polyps or small growths are found. Skin Cancer  Check your skin from head to toe regularly.  Tell your health care provider about any new moles or changes in moles, especially if there is a change in a mole's shape or color.  Also tell your health care provider if you have a mole that is larger than the size of a pencil eraser.  Always use sunscreen. Apply sunscreen liberally and repeatedly throughout the day.  Protect yourself by wearing long sleeves, pants, a wide-brimmed hat, and sunglasses whenever you are outside. HEART DISEASE, DIABETES, AND HIGH BLOOD PRESSURE   High blood pressure causes heart disease and increases the risk of stroke. High blood pressure is more likely to develop in:  People who have blood pressure in the high end of the normal range (130-139/85-89 mm Hg).  People who are overweight or obese.  People who are African American.  If you are 20-2 years of age, have your blood pressure  checked every 3-5 years. If you are 22 years of age or older, have your blood pressure checked every year. You should have your blood pressure measured twice--once when you are at a hospital or clinic, and once when you are not at a hospital or clinic. Record the average of the two measurements. To check your blood pressure when you are not at a hospital or clinic, you can use:  An automated blood pressure machine at a pharmacy.  A home blood pressure monitor.  If you are between 12 years and 65 years old, ask your health care provider if you should take aspirin to prevent strokes.  Have regular diabetes screenings. This involves taking a blood sample to check your fasting blood sugar level.  If you are at a normal weight and have a low risk for diabetes, have this test once every three years after 79 years of age.  If you are overweight and have a high risk for diabetes, consider being tested at a younger age or more often. PREVENTING INFECTION  Hepatitis B  If you have a higher risk for hepatitis B, you should be screened for this virus. You are considered at high risk for hepatitis B if:  You were born in a country where hepatitis B is common. Ask your health care provider which countries are considered high risk.  Your parents were born in a high-risk country, and you have not been immunized against hepatitis B (hepatitis B vaccine).  You have HIV or AIDS.  You use needles to inject street drugs.  You live with someone who has hepatitis B.  You have had sex with someone who has hepatitis B.  You get hemodialysis treatment.  You take certain medicines for conditions, including cancer, organ transplantation, and autoimmune conditions. Hepatitis C  Blood testing is recommended for:  Everyone born from [birth year] through 1963.  Anyone with known risk factors for hepatitis C. Sexually transmitted infections (STIs)  You should be screened for sexually transmitted infections (STIs)  including gonorrhea and chlamydia if:  You are sexually active and are younger than 79 years of age.  You are older than 79 years of age and your health care provider tells you that you are at risk for this type of infection.  Your sexual activity has changed since you were last screened and you are at an increased risk for chlamydia or gonorrhea. Ask your health care provider if you are at risk.  If you do not have HIV, but are at risk, it may be recommended that you take a prescription medicine  daily to prevent HIV infection. This is called pre-exposure prophylaxis (PrEP). You are considered at risk if:  You are sexually active and do not regularly use condoms or know the HIV status of your partner(s).  You take drugs by injection.  You are sexually active with a partner who has HIV. Talk with your health care provider about whether you are at high risk of being infected with HIV. If you choose to begin PrEP, you should first be tested for HIV. You should then be tested every 3 months for as long as you are taking PrEP.  PREGNANCY   If you are premenopausal and you may become pregnant, ask your health care provider about preconception counseling.  If you may become pregnant, take 400 to 800 micrograms (mcg) of folic acid every day.  If you want to prevent pregnancy, talk to your health care provider about birth control (contraception). OSTEOPOROSIS AND MENOPAUSE   Osteoporosis is a disease in which the bones lose minerals and strength with aging. This can result in serious bone fractures. Your risk for osteoporosis can be identified using a bone density scan.  If you are 65 years of age or older, or if you are at risk for osteoporosis and fractures, ask your health care provider if you should be screened.  Ask your health care provider whether you should take a calcium or vitamin D supplement to lower your risk for osteoporosis.  Menopause may have certain physical symptoms and  risks.  Hormone replacement therapy may reduce some of these symptoms and risks. Talk to your health care provider about whether hormone replacement therapy is right for you.  HOME CARE INSTRUCTIONS   Schedule regular health, dental, and eye exams.  Stay current with your immunizations.   Do not use any tobacco products including cigarettes, chewing tobacco, or electronic cigarettes.  If you are pregnant, do not drink alcohol.  If you are breastfeeding, limit how much and how often you drink alcohol.  Limit alcohol intake to no more than 1 drink per day for nonpregnant women. One drink equals 12 ounces of beer, 5 ounces of wine, or 1 ounces of hard liquor.  Do not use street drugs.  Do not share needles.  Ask your health care provider for help if you need support or information about quitting drugs.  Tell your health care provider if you often feel depressed.  Tell your health care provider if you have ever been abused or do not feel safe at home.   This information is not intended to replace advice given to you by your health care provider. Make sure you discuss any questions you have with your health care provider.   Document Released: 02/26/2011 Document Revised: 09/03/2014 Document Reviewed: 07/15/2013 Elsevier Interactive Patient Education [employer].

## 2015-08-17 NOTE — Progress Notes (Signed)
Subjective:    Patient ID: [name], female    DOB: 12-29-1933, 81 y.o.   MRN: [id number]  HPI   Here for medicare wellness  Diet: heart healthy Physical activity: sedentary Depression/mood screen: reviewed Hearing: intact to whispered voice Visual acuity: grossly normal, performs annual eye exam  ADLs: capable Fall risk: none Home safety: good Cognitive evaluation: intact to orientation, naming, recall and repetition EOL planning: adv directives, full code/ I agree  I have personally reviewed and have noted 1. The patient's medical and social history 2. Their use of alcohol, tobacco or illicit drugs 3. Their current medications and supplements 4. The patient's functional ability including ADL's, fall risks, home safety risks and hearing or visual impairment. 5. Diet and physical activities 6. Evidence for depression or mood disorders  Also reviewed chronic medical conditions, interval events and current concerns   Past Medical History  Diagnosis Date  . Hypertension   . Hyperlipidemia   . Glaucoma   . Cigarette smoker   . Diverticulosis of colon   . Family history of colon cancer   . Hemorrhoids   . Renal cyst   . DJD (degenerative joint disease)   . Osteopenia   . Vitamin D deficiency   . Restless leg syndrome   . Anxiety   . Depression   . Atrial fibrillation ([place])   . History of sebaceous cyst     R posterior scalp - observation elected 01/2015 after surgical eval   Family History  Problem Relation Age of Onset  . Hypertension Mother   . Diabetes Mother   . Breast cancer Other     niece  . Colon cancer Brother   . Prostate cancer Father   . Diabetes Brother   . Diabetes Sister    Social History  Substance Use Topics  . Smoking status: Light Tobacco Smoker -- 0.30 packs/day  . Smokeless tobacco: Never Used     Comment: 6 cig a day  . Alcohol Use: No    Review of Systems  Constitutional: Positive for appetite change (poor intake).  Negative for fatigue and unexpected weight change.  Respiratory: Negative for cough, shortness of breath and wheezing.   Cardiovascular: Negative for chest pain, palpitations and leg swelling.  Gastrointestinal: Negative for nausea, abdominal pain and diarrhea.  Neurological: Negative for dizziness, weakness, light-headedness and headaches.  Psychiatric/Behavioral: Negative for dysphoric mood. The patient is not nervous/anxious.   All other systems reviewed and are negative.   Patient Care Team: Rowe Clack, MD as PCP - General (Internal Medicine) Lafayette Dragon, MD (Gastroenterology) Lelon Perla, MD (Cardiology) Delila Pereyra, MD (Gynecology) Susa Day, MD (Orthopedic Surgery) Rondel Oh, MD (Ophthalmology) Erroll Luna, MD (General Surgery)     Objective:    Physical Exam  Constitutional: She is oriented to person, place, and time. She appears well-developed. No distress.  underweight  HENT:  Head: Normocephalic and atraumatic.  Right Ear: External ear normal.  Left Ear: External ear normal.  Nose: Nose normal.  Mouth/Throat: Oropharynx is clear and moist. No oropharyngeal exudate.  Eyes: EOM are normal. Pupils are equal, round, and reactive to light. Right eye exhibits no discharge. Left eye exhibits no discharge. No scleral icterus.  Neck: Normal range of motion. Neck supple. No JVD present. No tracheal deviation present. No thyromegaly present.  Cardiovascular: Regular rhythm, normal heart sounds and intact distal pulses.  Exam reveals no friction rub.   No murmur heard. irreg irreg  Pulmonary/Chest: Effort  normal and breath sounds normal. No respiratory distress. She has no wheezes. She has no rales. She exhibits no tenderness.  Abdominal: Soft. Bowel sounds are normal. She exhibits no distension and no mass. There is no tenderness. There is no rebound and no guarding.  Genitourinary:  Defer to gyn  Musculoskeletal: Normal range of motion.  No  gross deformities  Lymphadenopathy:    She has no cervical adenopathy.  Neurological: She is alert and oriented to person, place, and time. She has normal reflexes. No cranial nerve deficit.  Skin: Skin is warm and dry. No rash noted. She is not diaphoretic. No erythema.  Psychiatric: She has a normal mood and affect. Her behavior is normal. Judgment and thought content normal.  Nursing note and vitals reviewed.   BP 124/76 mmHg  Pulse 90  Temp(Src) 97.8 F (36.6 C) (Oral)  Resp 18  Ht 5\' 6"  (1.676 m)  Wt 134 lb 1.6 oz (60.827 kg)  BMI 21.65 kg/m2  SpO2 98% Wt Readings from Last 3 Encounters:  08/17/15 134 lb 1.6 oz (60.827 kg)  05/27/15 132 lb (59.875 kg)  02/24/15 134 lb (60.782 kg)    Lab Results  Component Value Date   WBC 3.4* 07/18/2015   HGB 13.8 07/18/2015   HCT 41.4 07/18/2015   PLT 158 07/18/2015   GLUCOSE 59* 07/18/2015   CHOL 140 06/23/2014   TRIG 63 06/23/2014   HDL 60 06/23/2014   LDLCALC 67 06/23/2014   ALT 23 06/23/2014   AST 26 06/23/2014   NA 143 07/18/2015   K 3.9 07/18/2015   CL 107 07/18/2015   CREATININE 0.92 07/18/2015   BUN 12 07/18/2015   CO2 28 07/18/2015   TSH 0.85 06/22/2013   INR 2.1 06/21/2015    Dg Chest 2 View  12/24/2013  CLINICAL DATA:  Left lateral rib pain EXAM: CHEST  2 VIEW COMPARISON:  None. FINDINGS: The lungs are hyperinflated likely secondary to COPD. There is no focal parenchymal opacity, pleural effusion, or pneumothorax. The heart and mediastinal contours are unremarkable. There is osteoarthritis of the right glenohumeral joint. There is mild thoracic spine spondylosis. IMPRESSION: No active cardiopulmonary disease. Electronically Signed   By: Kathreen Devoid   On: 12/24/2013 10:52       Assessment & Plan:   AWV/z00.00 - Today patient counseled on age appropriate routine health concerns for screening and prevention, each reviewed and up to date or declined. Immunizations reviewed and up to date or declined. Labs ordered  and reviewed. Risk factors for depression reviewed and addressed. Hearing function and visual acuity are intact. ADLs screened and addressed as needed. Functional ability and level of safety reviewed and appropriate. Education, counseling and referrals performed based on assessed risks today. Patient provided with a copy of personalized plan for preventive services.  Problem List Items Addressed This Visit    Atrial fibrillation ([place])    Rate controlled on beta-blocker anticoag with CC at [facility] heartcare - changed from warfarin to eliquis 05/2015 (note $ concerns but takes same as rx'd) Last cards note reviewed - no changes recommended        Relevant Orders   Basic metabolic panel   Lipid panel   TSH   Depression    Mild chronic dysthymia symptoms - denies prior med treatment or counseling for same Denies SI/HI but poor appetite and low energy Low dose paxil selected after discussion of rx options - erx done we reviewed potential risk/benefit and possible side effects - pt  understands and agrees to same  followup in 3-4 mo to review symptoms and titrate dose as needed, will call sooner if side effects       Relevant Medications   PARoxetine (PAXIL) 10 MG tablet   Other Relevant Orders   Basic metabolic panel   Lipid panel   TSH   Essential hypertension    BP Readings from Last 3 Encounters:  08/17/15 124/76  05/27/15 122/70  02/24/15 124/80   The current medical regimen is effective;  continue present plan and medications.      Relevant Orders   Basic metabolic panel   Lipid panel   TSH   Irritable bowel syndrome (IBS)    Reviewed strategies for managing same - constant urge to defecate persists D>C symptoms prevail Prior GI evaluation reviewed - no red flags on hx, no unexpected weight changes Will start low dose Paxil for this condition - see "depression" discussion above       Relevant Orders   Basic metabolic panel   TSH    Other Visit Diagnoses    Routine  general medical examination at a health care facility    -  Primary        Gwendolyn Grant, MD

## 2015-08-17 NOTE — Assessment & Plan Note (Signed)
Mild chronic dysthymia symptoms - denies prior med treatment or counseling for same Denies SI/HI but poor appetite and low energy Low dose paxil selected after discussion of rx options - erx done we reviewed potential risk/benefit and possible side effects - pt understands and agrees to same  followup in 3-4 mo to review symptoms and titrate dose as needed, will call sooner if side effects

## 2015-08-17 NOTE — Assessment & Plan Note (Signed)
Rate controlled on beta-blocker anticoag with CC at [facility] - changed from warfarin to eliquis 05/2015 (note $ concerns but takes same as rx'd) Last cards note reviewed - no changes recommended

## 2015-08-27 ENCOUNTER — Other Ambulatory Visit: Payer: Self-pay | Admitting: Cardiology

## 2015-08-30 NOTE — Telephone Encounter (Signed)
Rx request sent to pharmacy.  

## 2015-09-09 ENCOUNTER — Other Ambulatory Visit: Payer: Self-pay | Admitting: Internal Medicine

## 2015-11-03 ENCOUNTER — Ambulatory Visit: Payer: Self-pay | Admitting: Pharmacist

## 2015-11-03 DIAGNOSIS — Z7901 Long term (current) use of anticoagulants: Secondary | ICD-10-CM

## 2015-11-03 DIAGNOSIS — I480 Paroxysmal atrial fibrillation: Secondary | ICD-10-CM

## 2015-11-21 ENCOUNTER — Encounter: Payer: Self-pay | Admitting: Internal Medicine

## 2015-11-21 ENCOUNTER — Ambulatory Visit (INDEPENDENT_AMBULATORY_CARE_PROVIDER_SITE_OTHER): Payer: Medicare Other | Admitting: Internal Medicine

## 2015-11-21 VITALS — BP 118/84 | HR 92 | Temp 98.0°F | Resp 16 | Wt 130.0 lb

## 2015-11-21 DIAGNOSIS — F329 Major depressive disorder, single episode, unspecified: Secondary | ICD-10-CM

## 2015-11-21 DIAGNOSIS — M25551 Pain in right hip: Secondary | ICD-10-CM

## 2015-11-21 DIAGNOSIS — E7800 Pure hypercholesterolemia, unspecified: Secondary | ICD-10-CM | POA: Diagnosis not present

## 2015-11-21 DIAGNOSIS — M25561 Pain in right knee: Secondary | ICD-10-CM

## 2015-11-21 DIAGNOSIS — F419 Anxiety disorder, unspecified: Secondary | ICD-10-CM | POA: Diagnosis not present

## 2015-11-21 DIAGNOSIS — I481 Persistent atrial fibrillation: Secondary | ICD-10-CM

## 2015-11-21 DIAGNOSIS — I4819 Other persistent atrial fibrillation: Secondary | ICD-10-CM

## 2015-11-21 DIAGNOSIS — I10 Essential (primary) hypertension: Secondary | ICD-10-CM | POA: Diagnosis not present

## 2015-11-21 DIAGNOSIS — F32A Depression, unspecified: Secondary | ICD-10-CM

## 2015-11-21 DIAGNOSIS — M1711 Unilateral primary osteoarthritis, right knee: Secondary | ICD-10-CM

## 2015-11-21 NOTE — Progress Notes (Signed)
Subjective:    Patient ID: [name], female    DOB: 1934/01/17, 82 y.o.   MRN: [id number]  HPI She is here to establish with a new pcp.  She is here for follow up.   Hyperlipidemia: She is taking her medication daily. She is compliant with a low fat/cholesterol diet. She is not exercising regularly. She denies myalgias.   Afib (permanent), Hypertension: She is taking her medication daily. She is compliant with a low sodium diet.  She denies chest pain, edema, shortness of breath and regular headaches. She is not exercising regularly.  She does not monitor her blood pressure at home.    Depression, anxiety: She has some mild anxiety, but denies depression.  She was started on paxil at her last visit for IBS, but stopped the medication due to stomach upset.     Smoker:  She still smokes.  She says she wants to quit, but is not sure if she truly wants to quit.    Right knee pain: She has arthritis in her knee.  It aches all the time and she feels it throws her balance off.  She walks with a cane.  She wonders about a "gel Injection" her niece had.  She has seen Dr. Tonita Cong in the past and would like to see him again.  She also had a hip replacement several years ago and has pain in that hip.    Medications and allergies reviewed with patient and updated if appropriate.  Patient Active Problem List   Diagnosis Date Noted  . Irritable bowel syndrome (IBS) 02/24/2015  . Long term current use of anticoagulant therapy 02/07/2012  . Atrial fibrillation ([place]) 02/01/2012  . S/P dilatation of esophageal stricture 12/25/2010  . Osteopenia of the elderly 06/23/2010  . VITAMIN D DEFICIENCY 06/20/2009  . RENAL CYST 12/20/2008  . Depression 06/22/2008  . CIGARETTE SMOKER 12/22/2007  . RESTLESS LEGS SYNDROME 12/22/2007  . Glaucoma 12/22/2007  . DIVERTICULOSIS OF COLON 12/22/2007  . HYPERCHOLESTEROLEMIA 11/25/2007  . Anxiety 11/25/2007  . Essential hypertension 11/25/2007  . DEGENERATIVE  JOINT DISEASE 11/25/2007    Current Outpatient Prescriptions on File Prior to Visit  Medication Sig Dispense Refill  . apixaban (ELIQUIS) 5 MG TABS tablet Take 1 tablet (5 mg total) by mouth 2 (two) times daily. 60 tablet 6  . atorvastatin (LIPITOR) 20 MG tablet Take 1 tablet (20 mg total) by mouth daily at 6 PM. 30 tablet 11  . Cholecalciferol (VITAMIN D) 2000 UNITS CAPS Take 1 capsule by mouth daily. Reported on 08/17/2015    . dorzolamide-timolol (COSOPT) 22.3-6.8 MG/ML ophthalmic solution Place 1 drop into both eyes 2 (two) times daily.    Marland Kitchen latanoprost (XALATAN) 0.005 % ophthalmic solution Place 1 drop into both eyes at bedtime.     Marland Kitchen losartan-hydrochlorothiazide (HYZAAR) 50-12.5 MG per tablet Take 1 tablet by mouth daily. 30 tablet 11  . metoprolol (LOPRESSOR) 100 MG tablet take 1 tablet by mouth twice a day 60 tablet 8  . pilocarpine (PILOCAR) 1 % ophthalmic solution Place 1 drop into the left eye 2 (two) times daily.    . potassium chloride (K-DUR,KLOR-CON) 10 MEQ tablet take 1 tablet by mouth once daily 30 tablet 1  . [DISCONTINUED] potassium chloride (K-DUR) 10 MEQ tablet Take 1 tablet (10 mEq total) by mouth daily. 30 tablet 2   No current facility-administered medications on file prior to visit.    Past Medical History  Diagnosis Date  . Hypertension   .  Hyperlipidemia   . Glaucoma   . Cigarette smoker   . Diverticulosis of colon   . Family history of colon cancer   . Hemorrhoids   . Renal cyst   . DJD (degenerative joint disease)   . Osteopenia   . Vitamin D deficiency   . Restless leg syndrome   . Anxiety   . Depression   . Atrial fibrillation ([place])   . History of sebaceous cyst     R posterior scalp - observation elected 01/2015 after surgical eval    Past Surgical History  Procedure Laterality Date  . Total hip arthroplasty  2/05    right by Dr. Tonita Cong  . Cataract extraction    . Upper gastrointestinal endoscopy    . Colonoscopy    . Retina surgery x 2   2013  . Cardioversion  04/23/2012    Procedure: CARDIOVERSION;  Surgeon: Hillary Bow, MD;  Location: [facility] ENDOSCOPY;  Service: Cardiovascular;  Laterality: N/A;    Social History   Social History  . Marital Status: Widowed    Spouse Name: N/A  . Number of Children: 0  . Years of Education: N/A   Occupational History  . Retired    Social History Main Topics  . Smoking status: Light Tobacco Smoker -- 0.30 packs/day  . Smokeless tobacco: Never Used     Comment: 6 cig a day  . Alcohol Use: No  . Drug Use: No  . Sexual Activity: Not Currently   Other Topics Concern  . Not on file   Social History Narrative   Exercises 2 times per week   Caffeine use: 1 cup coffee per day   Lives alone, widowed 1978    Family History  Problem Relation Age of Onset  . Hypertension Mother   . Diabetes Mother   . Breast cancer Other     niece  . Colon cancer Brother   . Prostate cancer Father   . Diabetes Brother   . Diabetes Sister     Review of Systems  Constitutional: Negative for fever, chills, appetite change (appetite is low) and unexpected weight change.  Respiratory: Negative for cough, shortness of breath and wheezing.   Cardiovascular: Positive for palpitations. Negative for chest pain and leg swelling.  Gastrointestinal: Positive for abdominal pain (IBS, with bowel movement - resolves after having a BM). Negative for nausea.       No GERD  Musculoskeletal: Positive for arthralgias.  Neurological: Positive for light-headedness (at times, when she first gets up). Negative for dizziness and headaches.  Psychiatric/Behavioral: Negative for sleep disturbance and dysphoric mood. The patient is nervous/anxious.        Objective:   Filed Vitals:   11/21/15 0903  BP: 118/84  Pulse: 92  Temp: 98 F (36.7 C)  Resp: 16   Filed Weights   11/21/15 0903  Weight: 130 lb (58.968 kg)   Body mass index is 20.99 kg/(m^2).   Physical Exam Constitutional: Appears well-developed  and well-nourished. No distress.  Neck: Neck supple. No tracheal deviation present. No thyromegaly present.  No carotid bruit. No cervical adenopathy.   Cardiovascular: Normal rate, irregular rhythm and normal heart sounds.   No murmur heard.  trace edema Pulmonary/Chest: Effort normal and breath sounds normal. No respiratory distress. No wheezes.      Assessment & Plan:   See Problem List for Assessment and Plan of chronic medical problems.  Overall she feels well. Referred to orthopedics for further evaluation of her knee  pain/hip pain  Follow up once a year -jan or feb for wellness/fu

## 2015-11-21 NOTE — Assessment & Plan Note (Signed)
BP well controlled Current regimen effective and well tolerated Continue current medications at current doses  

## 2015-11-21 NOTE — Assessment & Plan Note (Signed)
Taking Lipitor 20 mg daily Lipids well controlled Continue

## 2015-11-21 NOTE — Assessment & Plan Note (Signed)
Rate controlled, irregular rhythm on exam Anticoagulated with Eliquis Following with cardiology

## 2015-11-21 NOTE — Patient Instructions (Addendum)
It was nice to meet you.   No immunizations administered today.   Medications reviewed and updated.  No changes recommended at this time.   A referral was ordered for Dr. Tonita Cong.   Please followup in early next year for a wellness visit.

## 2015-11-21 NOTE — Assessment & Plan Note (Signed)
Right knee, right hip status post hip replacement Would like to avoid knee replacement Would like to consider injections for the knee Refer to orthopedics

## 2015-11-21 NOTE — Assessment & Plan Note (Signed)
History of depression-denies current depression Will monitor

## 2015-11-21 NOTE — Assessment & Plan Note (Signed)
Mild anxiety, but does not affect her quality of life Did not tolerate Paxil, which was originally prescribed for IBS Okay without medication for now Will monitor

## 2015-11-21 NOTE — Progress Notes (Signed)
Pre visit review using our clinic review tool, if applicable. No additional management support is needed unless otherwise documented below in the visit note. 

## 2015-12-08 DIAGNOSIS — Z124 Encounter for screening for malignant neoplasm of cervix: Secondary | ICD-10-CM | POA: Diagnosis not present

## 2015-12-08 DIAGNOSIS — Z6820 Body mass index (BMI) 20.0-20.9, adult: Secondary | ICD-10-CM | POA: Diagnosis not present

## 2015-12-08 DIAGNOSIS — Z1231 Encounter for screening mammogram for malignant neoplasm of breast: Secondary | ICD-10-CM | POA: Diagnosis not present

## 2015-12-09 DIAGNOSIS — H401113 Primary open-angle glaucoma, right eye, severe stage: Secondary | ICD-10-CM | POA: Diagnosis not present

## 2015-12-13 DIAGNOSIS — M25561 Pain in right knee: Secondary | ICD-10-CM | POA: Diagnosis not present

## 2015-12-13 DIAGNOSIS — M25551 Pain in right hip: Secondary | ICD-10-CM | POA: Diagnosis not present

## 2015-12-13 DIAGNOSIS — M1711 Unilateral primary osteoarthritis, right knee: Secondary | ICD-10-CM | POA: Diagnosis not present

## 2015-12-13 DIAGNOSIS — R1030 Lower abdominal pain, unspecified: Secondary | ICD-10-CM | POA: Diagnosis not present

## 2015-12-15 ENCOUNTER — Other Ambulatory Visit: Payer: Self-pay | Admitting: *Deleted

## 2015-12-15 MED ORDER — POTASSIUM CHLORIDE CRYS ER 10 MEQ PO TBCR: 10.0000 meq | EXTENDED_RELEASE_TABLET | Freq: Every day | ORAL | Status: DC

## 2015-12-15 NOTE — Telephone Encounter (Signed)
Receive call pt states pharmacy fax refill request on her potassium on Monday nad they state they haven't receive back. Inform pt per chart no electronic request, but will send rx to [facility]...[name]

## 2016-01-02 DIAGNOSIS — H401113 Primary open-angle glaucoma, right eye, severe stage: Secondary | ICD-10-CM | POA: Diagnosis not present

## 2016-01-02 DIAGNOSIS — H401122 Primary open-angle glaucoma, left eye, moderate stage: Secondary | ICD-10-CM | POA: Diagnosis not present

## 2016-01-10 ENCOUNTER — Other Ambulatory Visit: Payer: Self-pay

## 2016-01-10 MED ORDER — APIXABAN 5 MG PO TABS: 5.0000 mg | ORAL_TABLET | Freq: Two times a day (BID) | ORAL | Status: DC

## 2016-01-16 ENCOUNTER — Ambulatory Visit (INDEPENDENT_AMBULATORY_CARE_PROVIDER_SITE_OTHER): Payer: Medicare Other | Admitting: Pharmacist

## 2016-01-16 VITALS — Wt 129.8 lb

## 2016-01-16 DIAGNOSIS — I4891 Unspecified atrial fibrillation: Secondary | ICD-10-CM | POA: Diagnosis not present

## 2016-01-16 LAB — CBC
HEMATOCRIT: 41.3 % (ref 35.0–45.0)
Hemoglobin: 13.9 g/dL (ref 11.7–15.5)
MCH: 30.8 pg (ref 27.0–33.0)
MCHC: 33.7 g/dL (ref 32.0–36.0)
MCV: 91.6 fL (ref 80.0–100.0)
MPV: 10.3 fL (ref 7.5–12.5)
PLATELETS: 154 10*3/uL (ref 140–400)
RBC: 4.51 MIL/uL (ref 3.80–5.10)
RDW: 13.8 % (ref 11.0–15.0)
WBC: 4.5 10*3/uL (ref 3.8–10.8)

## 2016-01-16 LAB — BASIC METABOLIC PANEL
BUN: 13 mg/dL (ref 7–25)
CHLORIDE: 107 mmol/L (ref 98–110)
CO2: 28 mmol/L (ref 20–31)
CREATININE: 0.95 mg/dL — AB (ref 0.60–0.88)
Calcium: 9 mg/dL (ref 8.6–10.4)
GLUCOSE: 78 mg/dL (ref 65–99)
POTASSIUM: 3.8 mmol/L (ref 3.5–5.3)
Sodium: 142 mmol/L (ref 135–146)

## 2016-01-16 MED ORDER — APIXABAN 2.5 MG PO TABS: 2.5000 mg | ORAL_TABLET | Freq: Two times a day (BID) | ORAL | Status: DC

## 2016-01-16 NOTE — Progress Notes (Signed)
Pt was started on Eliquis for atrial fibrillation on 06/22/15 by Dr. Stanford Breed.  Reviewed patients medication list.  Pt is not currently on any combined P-gp and strong CYP3A4 inhibitors/inducers (ketoconazole, traconazole, ritonavir, carbamazepine, phenytoin, rifampin, St. John's wort).  Reviewed labs.  SCr 0.95, Weight 59kg, age 82.  Dose inappropriate now that pt is 82 and wt < 60kg. Will reduce dose to 2.5mg  BID.   Hgb and HCT WNL at 13.9 and 41.3, respectively.  A full discussion of the nature of anticoagulants has been carried out.  A benefit/risk analysis has been presented to the patient, so that they understand the justification for choosing anticoagulation with Eliquis at this time.  The need for compliance is stressed.  Pt is aware to take the medication twice daily.  Side effects of potential bleeding are discussed, including unusual colored urine or stools, coughing up blood or coffee ground emesis, nose bleeds or serious fall or head trauma.  Discussed signs and symptoms of stroke. The patient should avoid any OTC items containing aspirin or ibuprofen.  Avoid alcohol consumption.   Call if any signs of abnormal bleeding.  Discussed financial obligations and resolved any difficulty in obtaining medication.    Spoke with patient regarding lab results. Sent in rx for lower dose of Eliquis 2.5mg  BID. Next lab test test in 6 months.

## 2016-03-05 ENCOUNTER — Other Ambulatory Visit: Payer: Self-pay | Admitting: Internal Medicine

## 2016-03-15 ENCOUNTER — Other Ambulatory Visit: Payer: Self-pay | Admitting: Internal Medicine

## 2016-05-07 ENCOUNTER — Encounter: Payer: Self-pay | Admitting: *Deleted

## 2016-05-18 NOTE — Progress Notes (Signed)
HPI: FU atrial fibrillation. She has a hx of s/p failed DCCV in 8/13, HTN, HL, tobacco abuse. Echo 01/2012: EF 55-65%, normal wall motion, grade 2 diastolic dysfunction, mild MR. Holter 10/14 showed afib, rate controlled. Since last seen, she denies dyspnea, chest pain, palpitations or syncope. No bleeding.   Current Outpatient Prescriptions  Medication Sig Dispense Refill  . apixaban (ELIQUIS) 2.5 MG TABS tablet Take 1 tablet (2.5 mg total) by mouth 2 (two) times daily. 60 tablet 11  . atorvastatin (LIPITOR) 20 MG tablet take 1 tablet by mouth AT 6PM 30 tablet 11  . Cholecalciferol (VITAMIN D) 2000 UNITS CAPS Take 1 capsule by mouth daily. Reported on 08/17/2015    . dorzolamide-timolol (COSOPT) 22.3-6.8 MG/ML ophthalmic solution Place 1 drop into both eyes 2 (two) times daily.    Marland Kitchen latanoprost (XALATAN) 0.005 % ophthalmic solution Place 1 drop into both eyes at bedtime.     Marland Kitchen losartan-hydrochlorothiazide (HYZAAR) 50-12.5 MG tablet take 1 tablet by mouth once daily 30 tablet 11  . metoprolol (LOPRESSOR) 100 MG tablet take 1 tablet by mouth twice a day (Patient taking differently: take 1 tablet by mouth twice a day patient take qd) 60 tablet 8  . pilocarpine (PILOCAR) 1 % ophthalmic solution Place 1 drop into the left eye 2 (two) times daily.    . potassium chloride (K-DUR,KLOR-CON) 10 MEQ tablet Take 1 tablet (10 mEq total) by mouth daily. 90 tablet 1   No current facility-administered medications for this visit.      Past Medical History:  Diagnosis Date  . Anxiety   . Atrial fibrillation ([place])   . Cigarette smoker   . Depression   . Diverticulosis of colon   . DJD (degenerative joint disease)   . Family history of colon cancer   . Glaucoma   . Hemorrhoids   . History of sebaceous cyst    R posterior scalp - observation elected 01/2015 after surgical eval  . Hyperlipidemia   . Hypertension   . Osteopenia   . Renal cyst   . Restless leg syndrome   . Vitamin D deficiency       Past Surgical History:  Procedure Laterality Date  . CARDIOVERSION  04/23/2012   Procedure: CARDIOVERSION;  Surgeon: Hillary Bow, MD;  Location: [facility] ENDOSCOPY;  Service: Cardiovascular;  Laterality: N/A;  . CATARACT EXTRACTION    . COLONOSCOPY    . retina surgery x 2  2013  . TOTAL HIP ARTHROPLASTY  2/05   right by Dr. Tonita Cong  . UPPER GASTROINTESTINAL ENDOSCOPY      Social History   Social History  . Marital status: Widowed    Spouse name: N/A  . Number of children: 0  . Years of education: N/A   Occupational History  . Retired    Social History Main Topics  . Smoking status: Light Tobacco Smoker    Packs/day: 0.30  . Smokeless tobacco: Never Used     Comment: 6 cig a day  . Alcohol use No  . Drug use: No  . Sexual activity: Not Currently   Other Topics Concern  . Not on file   Social History Narrative   Exercises 2 times per week   Caffeine use: 1 cup coffee per day   Lives alone, widowed 1978    Family History  Problem Relation Age of Onset  . Hypertension Mother   . Diabetes Mother   . Colon cancer Brother   . Prostate  cancer Father   . Breast cancer Other     niece  . Diabetes Brother   . Diabetes Sister     ROS: Some unsteadiness with ambulation and leg aching but no fevers or chills, productive cough, hemoptysis, dysphasia, odynophagia, melena, hematochezia, dysuria, hematuria, rash, seizure activity, orthopnea, PND, pedal edema, claudication. Remaining systems are negative.  Physical Exam: Well-developed well-nourished in no acute distress.  Skin is warm and dry.  HEENT is normal.  Neck is supple.  Chest is clear to auscultation with normal expansion.  Cardiovascular exam is irregular Abdominal exam nontender or distended. No masses palpated. Extremities show no edema. 2+ DP neuro grossly intact  ECG Atrial fibrillation at a rate of 79. Nonspecific ST changes.  A/P  1 hyperlipidemia-continue statin.  2 tobacco abuse-patient  counseled on discontinuing.  3 hypertension-blood pressure controlled. Continue present medications.  4 permanent atrial fibrillation-continue metoprolol for rate control. However she is taking 100 mg daily. Changed to 50 mg twice a day. Continue apixaban.  Kirk Ruths, MD

## 2016-05-21 ENCOUNTER — Ambulatory Visit (INDEPENDENT_AMBULATORY_CARE_PROVIDER_SITE_OTHER): Payer: Medicare Other | Admitting: Cardiology

## 2016-05-21 ENCOUNTER — Encounter: Payer: Self-pay | Admitting: Cardiology

## 2016-05-21 VITALS — BP 114/72 | HR 78 | Ht 66.5 in | Wt 127.2 lb

## 2016-05-21 DIAGNOSIS — I4891 Unspecified atrial fibrillation: Secondary | ICD-10-CM

## 2016-05-21 DIAGNOSIS — E785 Hyperlipidemia, unspecified: Secondary | ICD-10-CM

## 2016-05-21 DIAGNOSIS — F17200 Nicotine dependence, unspecified, uncomplicated: Secondary | ICD-10-CM | POA: Diagnosis not present

## 2016-05-21 DIAGNOSIS — I482 Chronic atrial fibrillation, unspecified: Secondary | ICD-10-CM

## 2016-05-21 MED ORDER — METOPROLOL TARTRATE 50 MG PO TABS: 50.0000 mg | ORAL_TABLET | Freq: Two times a day (BID) | ORAL | 3 refills | Status: DC

## 2016-05-21 NOTE — Patient Instructions (Signed)
Medication Instructions:   DECREASE METOPROLOL TO 50 MG TWICE DAILY= 1/2 OF THE 100 MG TABLET TWICE DAILY  Follow-Up:  Your physician wants you to follow-up in: [place] will receive a reminder letter in the mail two months in advance. If you don't receive a letter, please call our office to schedule the follow-up appointment.   If you need a refill on your cardiac medications before your next appointment, please call your pharmacy.

## 2016-06-05 ENCOUNTER — Other Ambulatory Visit: Payer: Self-pay | Admitting: Cardiology

## 2016-07-16 ENCOUNTER — Ambulatory Visit (INDEPENDENT_AMBULATORY_CARE_PROVIDER_SITE_OTHER): Payer: Medicare Other | Admitting: *Deleted

## 2016-07-16 DIAGNOSIS — I4891 Unspecified atrial fibrillation: Secondary | ICD-10-CM

## 2016-07-16 LAB — CBC WITH DIFFERENTIAL/PLATELET
BASOS PCT: 1 %
Basophils Absolute: 40 cells/uL (ref 0–200)
EOS PCT: 3 %
Eosinophils Absolute: 120 cells/uL (ref 15–500)
HCT: 41.1 % (ref 35.0–45.0)
Hemoglobin: 13.7 g/dL (ref 11.7–15.5)
LYMPHS PCT: 37 %
Lymphs Abs: 1480 cells/uL (ref 850–3900)
MCH: 30.9 pg (ref 27.0–33.0)
MCHC: 33.3 g/dL (ref 32.0–36.0)
MCV: 92.6 fL (ref 80.0–100.0)
MONOS PCT: 11 %
MPV: 9.9 fL (ref 7.5–12.5)
Monocytes Absolute: 440 cells/uL (ref 200–950)
Neutro Abs: 1920 cells/uL (ref 1500–7800)
Neutrophils Relative %: 48 %
PLATELETS: 143 10*3/uL (ref 140–400)
RBC: 4.44 MIL/uL (ref 3.80–5.10)
RDW: 13.7 % (ref 11.0–15.0)
WBC: 4 10*3/uL (ref 3.8–10.8)

## 2016-07-16 LAB — BASIC METABOLIC PANEL
BUN: 19 mg/dL (ref 7–25)
CHLORIDE: 108 mmol/L (ref 98–110)
CO2: 26 mmol/L (ref 20–31)
CREATININE: 0.91 mg/dL — AB (ref 0.60–0.88)
Calcium: 9 mg/dL (ref 8.6–10.4)
Glucose, Bld: 81 mg/dL (ref 65–99)
POTASSIUM: 3.6 mmol/L (ref 3.5–5.3)
SODIUM: 141 mmol/L (ref 135–146)

## 2016-07-16 NOTE — Patient Instructions (Addendum)
Pt was started on Eliquis for 01/16/2016 Reviewed patients medication list, Yes.   Pt currently not on any combined P-gp and strong CYP3A4 inhibitors/inducers (ketoconazole, traconazole, ritonavir, carbamazepine, phenytoin, rifampin, St. John's wort).  Reviewed labs.  SCr 0.95  Weight 131 lb CrCl-  44 ml per / min  Dose based on age, weight, and SCr.  Hgb and HCT , cc 44 ml per / min is correct. A full discussion of the nature of anticoagulants has been carried out.  A benefit/risk analysis has been presented to the patient, so that they understand the justification for choosing anticoagulation with Eliquis at this time.  The need for compliance is stressed.  Pt is aware to take the medication twice daily.  Side effects of potential bleeding are discussed, including unusual colored urine or stools, coughing up blood or coffee ground emesis, nose bleeds or serious fall or head trauma.  Discussed signs and symptoms of stroke. The patient should avoid any OTC items containing aspirin or ibuprofen.  Avoid alcohol consumption.   Call if any signs of abnormal bleeding.  Discussed financial obligations and resolved any difficulty in obtaining medication.  Next lab test test in 6 months.  H&H---13.7, 41.1 Serum creatinine---0.95  Notified patient of her lab work being wnl

## 2016-08-02 ENCOUNTER — Other Ambulatory Visit: Payer: Self-pay

## 2016-08-13 DIAGNOSIS — Z961 Presence of intraocular lens: Secondary | ICD-10-CM | POA: Diagnosis not present

## 2016-08-13 DIAGNOSIS — H401122 Primary open-angle glaucoma, left eye, moderate stage: Secondary | ICD-10-CM | POA: Diagnosis not present

## 2016-08-13 DIAGNOSIS — H401113 Primary open-angle glaucoma, right eye, severe stage: Secondary | ICD-10-CM | POA: Diagnosis not present

## 2016-08-15 NOTE — Progress Notes (Addendum)
Subjective:   [name] is a 82 y.o. female who presents for Medicare Annual (Subsequent) preventive examination.  The Patient was informed that the wellness visit is to identify future health risk and educate and initiate measures that can reduce risk for increased disease through the lifespan.    Review of Systems:  No ROS.  Medicare Wellness Visit.  Cardiac Risk Factors include: advanced age (>16men, >34 women);hypertension;dyslipidemia;family history of premature cardiovascular disease;sedentary lifestyle   Sleep patterns: Denies sleep issues.  Home Safety/Smoke Alarms:  Smoke detector in place.  Living environment; residence and Firearm Safety: Lives alone in single story home. Feels safe. Neighbor visits often. No firearms.  Seat Belt Safety/Bike Helmet: wears seatbelt   Counseling:   Eye Exam-Last exam 07/2016, every 6 months at [facility] exam 06/2016, every 6 months (unsure of name)  Female:   Pap-N/A      Mammo-Pt reports 2016, negative. [facility]. Will obtain records.        Dexa scan-Pt reports 2016, normal. GYN, will obtain records.       CCS-colonoscopy 02/04/2006, diverticulosis. No recall, Aged out.        Objective:     Vitals: BP 120/60 (BP Location: Left Arm, Patient Position: Sitting, Cuff Size: Normal)   Pulse 60   Ht 5\' 7"  (1.702 m)   Wt 128 lb 1.9 oz (58.1 kg)   SpO2 95%   BMI 20.07 kg/m   Body mass index is 20.07 kg/m.   Tobacco History  Smoking Status  . Light Tobacco Smoker  . Packs/day: 0.30  Smokeless Tobacco  . Never Used    Comment: 6 cig a day     Ready to quit: No Counseling given: No   Past Medical History:  Diagnosis Date  . Anxiety   . Atrial fibrillation ([place])   . Cigarette smoker   . Depression   . Diverticulosis of colon   . DJD (degenerative joint disease)   . Family history of colon cancer   . Glaucoma   . Hemorrhoids   . History of sebaceous cyst    R posterior  scalp - observation elected 01/2015 after surgical eval  . Hyperlipidemia   . Hypertension   . Osteopenia   . Renal cyst   . Restless leg syndrome   . Vitamin D deficiency    Past Surgical History:  Procedure Laterality Date  . CARDIOVERSION  04/23/2012   Procedure: CARDIOVERSION;  Surgeon: Hillary Bow, MD;  Location: [facility] ENDOSCOPY;  Service: Cardiovascular;  Laterality: N/A;  . CATARACT EXTRACTION    . COLONOSCOPY    . retina surgery x 2  2013  . TOTAL HIP ARTHROPLASTY  2/05   right by Dr. Tonita Cong  . UPPER GASTROINTESTINAL ENDOSCOPY     Family History  Problem Relation Age of Onset  . Hypertension Mother   . Diabetes Mother   . Colon cancer Brother   . Prostate cancer Father   . Breast cancer Other     niece  . Diabetes Brother   . Diabetes Sister    History  Sexual Activity  . Sexual activity: Not Currently    Outpatient Encounter Prescriptions as of 08/16/2016  Medication Sig  . apixaban (ELIQUIS) 2.5 MG TABS tablet Take 1 tablet (2.5 mg total) by mouth 2 (two) times daily.  Marland Kitchen atorvastatin (LIPITOR) 20 MG tablet take 1 tablet by mouth AT 6PM  . Cholecalciferol (VITAMIN D) 2000 UNITS CAPS Take 1 capsule  by mouth daily as needed. Reported on 08/17/2015  . dorzolamide-timolol (COSOPT) 22.3-6.8 MG/ML ophthalmic solution Place 1 drop into both eyes 2 (two) times daily.  Marland Kitchen latanoprost (XALATAN) 0.005 % ophthalmic solution Place 1 drop into both eyes at bedtime.   Marland Kitchen losartan-hydrochlorothiazide (HYZAAR) 50-12.5 MG tablet take 1 tablet by mouth once daily  . metoprolol (LOPRESSOR) 50 MG tablet Take 1 tablet (50 mg total) by mouth 2 (two) times daily.  . pilocarpine (PILOCAR) 1 % ophthalmic solution Place 1 drop into the left eye 2 (two) times daily.  . potassium chloride (K-DUR,KLOR-CON) 10 MEQ tablet Take 1 tablet (10 mEq total) by mouth daily. (Patient taking differently: Take 10 mEq by mouth daily as needed. )  . metoprolol (LOPRESSOR) 100 MG tablet take 1 tablet by mouth  twice a day (Patient not taking: Reported on 08/16/2016)   No facility-administered encounter medications on file as of 08/16/2016.     Activities of Daily Living In your present state of health, do you have any difficulty performing the following activities: 08/16/2016  Hearing? Y  Vision? N  Difficulty concentrating or making decisions? N  Walking or climbing stairs? Y  Dressing or bathing? N  Doing errands, shopping? N  Preparing Food and eating ? N  Using the Toilet? N  In the past six months, have you accidently leaked urine? N  Do you have problems with loss of bowel control? N  Managing your Medications? N  Managing your Finances? N  Housekeeping or managing your Housekeeping? N  Some recent data might be hidden    Patient Care Team: Binnie Rail, MD as PCP - General (Internal Medicine) Lafayette Dragon, MD (Inactive) (Gastroenterology) Lelon Perla, MD (Cardiology) Susa Day, MD (Orthopedic Surgery) Rondel Oh, MD (Ophthalmology) Erroll Luna, MD (General Surgery)    Assessment:    Physical assessment deferred to PCP.  Exercise Activities and Dietary recommendations Exercise limited by: orthopedic condition(s)   Diet (meal preparation, eat out, water intake, caffeinated beverages, dairy products, fruits and vegetables): Drinks water. Limited fruit intake.   Breakfast: Ensure Plus Lunch: Snacks on chips.  Dinner: Eats out, vegetables.   Patient states appetite is poor, drinks Ensure. Encouraged to eat often. Exercise limited by mobility.   Goals      Patient Stated   . <enter goal here> (pt-stated)          Maintain health and do for myself.       Fall Risk Fall Risk  08/16/2016 08/17/2015 08/12/2014  Falls in the past year? Yes No No  Number falls in past yr: 1 - -  Risk for fall due to : Impaired mobility - -  Follow up Falls prevention discussed - -   Depression Screen PHQ 2/9 Scores 08/16/2016 08/17/2015 08/12/2014  PHQ - 2  Score 0 3 0  PHQ- 9 Score - 10 -     Cognitive Function MMSE - Mini Mental State Exam 08/16/2016  Orientation to time 4  Orientation to Place 5  Registration 3  Attention/ Calculation 3  Recall 3  Language- name 2 objects 2  Language- repeat 1  Language- follow 3 step command 3  Language- read & follow direction 1  Write a sentence 1  Copy design 1  Total score 27        Immunization History  Administered Date(s) Administered  . Influenza Split 06/22/2011  . Influenza Whole 06/20/2009, 06/21/2010, 06/18/2012  . Influenza,inj,Quad PF,36+ Mos 06/18/2013, 06/23/2014, 05/27/2015  .  Pneumococcal Conjugate-13 08/12/2014  . Pneumococcal Polysaccharide-23 08/17/2015  . Td 08/12/2014   Screening Tests Health Maintenance  Topic Date Due  . ZOSTAVAX  11/18/1995  . INFLUENZA VACCINE  03/27/2016  . TETANUS/TDAP  08/12/2024  . DEXA SCAN  Completed  . PNA vac Low Risk Adult  Completed      Plan:     Eat heart healthy diet (full of fruits, vegetables, whole grains, lean protein, water--limit salt, fat, and sugar intake) and increase physical activity as tolerated.  Begin brain stimulating activities (puzzles, reading, adult coloring books, staying active) to keep memory sharp.   During the course of the visit the patient was educated and counseled about the following appropriate screening and preventive services:   Vaccines to include Pneumoccal, Influenza, Hepatitis B, Td, Zostavax, HCV  Cardiovascular Disease  Colorectal cancer screening  Bone density screening  Diabetes screening  Glaucoma screening  Mammography/PAP  Nutrition counseling   Patient Instructions (the written plan) was given to the patient.   Gerilyn Nestle, RN  08/16/2016   Medical screening examination/treatment/procedure(s) were performed by non-physician practitioner and as supervising physician I was immediately available for consultation/collaboration. I agree with above. Binnie Rail,  MD

## 2016-08-15 NOTE — Progress Notes (Signed)
Pre visit review using our clinic review tool, if applicable. No additional management support is needed unless otherwise documented below in the visit note. 

## 2016-08-16 ENCOUNTER — Ambulatory Visit (INDEPENDENT_AMBULATORY_CARE_PROVIDER_SITE_OTHER): Payer: Medicare Other

## 2016-08-16 ENCOUNTER — Other Ambulatory Visit: Payer: Self-pay | Admitting: Emergency Medicine

## 2016-08-16 ENCOUNTER — Other Ambulatory Visit (INDEPENDENT_AMBULATORY_CARE_PROVIDER_SITE_OTHER): Payer: Medicare Other

## 2016-08-16 VITALS — BP 120/60 | HR 60 | Ht 67.0 in | Wt 128.1 lb

## 2016-08-16 DIAGNOSIS — Z0000 Encounter for general adult medical examination without abnormal findings: Secondary | ICD-10-CM | POA: Diagnosis not present

## 2016-08-16 DIAGNOSIS — E7800 Pure hypercholesterolemia, unspecified: Secondary | ICD-10-CM

## 2016-08-16 LAB — LIPID PANEL
Cholesterol: 133 mg/dL (ref 0–200)
HDL: 57 mg/dL (ref >39.00)
LDL CALC: 63 mg/dL (ref 0–99)
NONHDL: 75.58
Total CHOL/HDL Ratio: 2
Triglycerides: 64 mg/dL (ref 0.0–149.0)
VLDL: 12.8 mg/dL (ref 0.0–40.0)

## 2016-08-16 NOTE — Assessment & Plan Note (Signed)
Fasting lipid panel ordered today.

## 2016-08-16 NOTE — Patient Instructions (Addendum)
Eat heart healthy diet (full of fruits, vegetables, whole grains, lean protein, water--limit salt, fat, and sugar intake) and increase physical activity as tolerated.  Begin brain stimulating activities (puzzles, reading, adult coloring books, staying active) to keep memory sharp.    Fall Prevention in the Home Introduction Falls can cause injuries. They can happen to people of all ages. There are many things you can do to make your home safe and to help prevent falls. What can I do on the outside of my home?  Regularly fix the edges of walkways and driveways and fix any cracks.  Remove anything that might make you trip as you walk through a door, such as a raised step or threshold.  Trim any bushes or trees on the path to your home.  Use bright outdoor lighting.  Clear any walking paths of anything that might make someone trip, such as rocks or tools.  Regularly check to see if handrails are loose or broken. Make sure that both sides of any steps have handrails.  Any raised decks and porches should have guardrails on the edges.  Have any leaves, snow, or ice cleared regularly.  Use sand or salt on walking paths during winter.  Clean up any spills in your garage right away. This includes oil or grease spills. What can I do in the bathroom?  Use night lights.  Install grab bars by the toilet and in the tub and shower. Do not use towel bars as grab bars.  Use non-skid mats or decals in the tub or shower.  If you need to sit down in the shower, use a plastic, non-slip stool.  Keep the floor dry. Clean up any water that spills on the floor as soon as it happens.  Remove soap buildup in the tub or shower regularly.  Attach bath mats securely with double-sided non-slip rug tape.  Do not have throw rugs and other things on the floor that can make you trip. What can I do in the bedroom?  Use night lights.  Make sure that you have a light by your bed that is easy to  reach.  Do not use any sheets or blankets that are too big for your bed. They should not hang down onto the floor.  Have a firm chair that has side arms. You can use this for support while you get dressed.  Do not have throw rugs and other things on the floor that can make you trip. What can I do in the kitchen?  Clean up any spills right away.  Avoid walking on wet floors.  Keep items that you use a lot in easy-to-reach places.  If you need to reach something above you, use a strong step stool that has a grab bar.  Keep electrical cords out of the way.  Do not use floor polish or wax that makes floors slippery. If you must use wax, use non-skid floor wax.  Do not have throw rugs and other things on the floor that can make you trip. What can I do with my stairs?  Do not leave any items on the stairs.  Make sure that there are handrails on both sides of the stairs and use them. Fix handrails that are broken or loose. Make sure that handrails are as long as the stairways.  Check any carpeting to make sure that it is firmly attached to the stairs. Fix any carpet that is loose or worn.  Avoid having throw rugs at the  top or bottom of the stairs. If you do have throw rugs, attach them to the floor with carpet tape.  Make sure that you have a light switch at the top of the stairs and the bottom of the stairs. If you do not have them, ask someone to add them for you. What else can I do to help prevent falls?  Wear shoes that:  Do not have high heels.  Have rubber bottoms.  Are comfortable and fit you well.  Are closed at the toe. Do not wear sandals.  If you use a stepladder:  Make sure that it is fully opened. Do not climb a closed stepladder.  Make sure that both sides of the stepladder are locked into place.  Ask someone to hold it for you, if possible.  Clearly mark and make sure that you can see:  Any grab bars or handrails.  First and last steps.  Where the  edge of each step is.  Use tools that help you move around (mobility aids) if they are needed. These include:  Canes.  Walkers.  Scooters.  Crutches.  Turn on the lights when you go into a dark area. Replace any light bulbs as soon as they burn out.  Set up your furniture so you have a clear path. Avoid moving your furniture around.  If any of your floors are uneven, fix them.  If there are any pets around you, be aware of where they are.  Review your medicines with your doctor. Some medicines can make you feel dizzy. This can increase your chance of falling. Ask your doctor what other things that you can do to help prevent falls. This information is not intended to replace advice given to you by your health care provider. Make sure you discuss any questions you have with your health care provider. Document Released: 06/09/2009 Document Revised: 01/19/2016 Document Reviewed: 09/17/2014  2017 [employer]  Health Maintenance, Female Introduction Adopting a healthy lifestyle and getting preventive care can go a long way to promote health and wellness. Talk with your health care provider about what schedule of regular examinations is right for you. This is a good chance for you to check in with your provider about disease prevention and staying healthy. In between checkups, there are plenty of things you can do on your own. Experts have done a lot of research about which lifestyle changes and preventive measures are most likely to keep you healthy. Ask your health care provider for more information. Weight and diet Eat a healthy diet  Be sure to include plenty of vegetables, fruits, low-fat dairy products, and lean protein.  Do not eat a lot of foods high in solid fats, added sugars, or salt.  Get regular exercise. This is one of the most important things you can do for your health.  Most adults should exercise for at least 150 minutes each week. The exercise should increase your  heart rate and make you sweat (moderate-intensity exercise).  Most adults should also do strengthening exercises at least twice a week. This is in addition to the moderate-intensity exercise. Maintain a healthy weight  Body mass index (BMI) is a measurement that can be used to identify possible weight problems. It estimates body fat based on height and weight. Your health care provider can help determine your BMI and help you achieve or maintain a healthy weight.  For females 37 years of age and older:  A BMI below 18.5 is considered underweight.  A  BMI of 18.5 to 24.9 is normal.  A BMI of 25 to 29.9 is considered overweight.  A BMI of 30 and above is considered obese. Watch levels of cholesterol and blood lipids  You should start having your blood tested for lipids and cholesterol at 80 years of age, then have this test every 5 years.  You may need to have your cholesterol levels checked more often if:  Your lipid or cholesterol levels are high.  You are older than 80 years of age.  You are at high risk for heart disease. Cancer screening Lung Cancer  Lung cancer screening is recommended for adults 68-3 years old who are at high risk for lung cancer because of a history of smoking.  A yearly low-dose CT scan of the lungs is recommended for people who:  Currently smoke.  Have quit within the past 15 years.  Have at least a 30-pack-year history of smoking. A pack year is smoking an average of one pack of cigarettes a day for 1 year.  Yearly screening should continue until it has been 15 years since you quit.  Yearly screening should stop if you develop a health problem that would prevent you from having lung cancer treatment. Breast Cancer  Practice breast self-awareness. This means understanding how your breasts normally appear and feel.  It also means doing regular breast self-exams. Let your health care provider know about any changes, no matter how small.  If you  are in your 20s or 30s, you should have a clinical breast exam (CBE) by a health care provider every 1-3 years as part of a regular health exam.  If you are 66 or older, have a CBE every year. Also consider having a breast X-ray (mammogram) every year.  If you have a family history of breast cancer, talk to your health care provider about genetic screening.  If you are at high risk for breast cancer, talk to your health care provider about having an MRI and a mammogram every year.  Breast cancer gene (BRCA) assessment is recommended for women who have family members with BRCA-related cancers. BRCA-related cancers include:  Breast.  Ovarian.  Tubal.  Peritoneal cancers.  Results of the assessment will determine the need for genetic counseling and BRCA1 and BRCA2 testing. Cervical Cancer  Your health care provider may recommend that you be screened regularly for cancer of the pelvic organs (ovaries, uterus, and vagina). This screening involves a pelvic examination, including checking for microscopic changes to the surface of your cervix (Pap test). You may be encouraged to have this screening done every 3 years, beginning at age 4.  For women ages 80-65, health care providers may recommend pelvic exams and Pap testing every 3 years, or they may recommend the Pap and pelvic exam, combined with testing for human papilloma virus (HPV), every 5 years. Some types of HPV increase your risk of cervical cancer. Testing for HPV may also be done on women of any age with unclear Pap test results.  Other health care providers may not recommend any screening for nonpregnant women who are considered low risk for pelvic cancer and who do not have symptoms. Ask your health care provider if a screening pelvic exam is right for you.  If you have had past treatment for cervical cancer or a condition that could lead to cancer, you need Pap tests and screening for cancer for at least 20 years after your  treatment. If Pap tests have been discontinued, your risk  factors (such as having a new sexual partner) need to be reassessed to determine if screening should resume. Some women have medical problems that increase the chance of getting cervical cancer. In these cases, your health care provider may recommend more frequent screening and Pap tests. Colorectal Cancer  This type of cancer can be detected and often prevented.  Routine colorectal cancer screening usually begins at 80 years of age and continues through 80 years of age.  Your health care provider may recommend screening at an earlier age if you have risk factors for colon cancer.  Your health care provider may also recommend using home test kits to check for hidden blood in the stool.  A small camera at the end of a tube can be used to examine your colon directly (sigmoidoscopy or colonoscopy). This is done to check for the earliest forms of colorectal cancer.  Routine screening usually begins at age 82.  Direct examination of the colon should be repeated every 5-10 years through 80 years of age. However, you may need to be screened more often if early forms of precancerous polyps or small growths are found. Skin Cancer  Check your skin from head to toe regularly.  Tell your health care provider about any new moles or changes in moles, especially if there is a change in a mole's shape or color.  Also tell your health care provider if you have a mole that is larger than the size of a pencil eraser.  Always use sunscreen. Apply sunscreen liberally and repeatedly throughout the day.  Protect yourself by wearing long sleeves, pants, a wide-brimmed hat, and sunglasses whenever you are outside. Heart disease, diabetes, and high blood pressure  High blood pressure causes heart disease and increases the risk of stroke. High blood pressure is more likely to develop in:  People who have blood pressure in the high end of the normal range  (130-139/85-89 mm Hg).  People who are overweight or obese.  People who are African American.  If you are 53-33 years of age, have your blood pressure checked every 3-5 years. If you are 14 years of age or older, have your blood pressure checked every year. You should have your blood pressure measured twice-once when you are at a hospital or clinic, and once when you are not at a hospital or clinic. Record the average of the two measurements. To check your blood pressure when you are not at a hospital or clinic, you can use:  An automated blood pressure machine at a pharmacy.  A home blood pressure monitor.  If you are between 31 years and 25 years old, ask your health care provider if you should take aspirin to prevent strokes.  Have regular diabetes screenings. This involves taking a blood sample to check your fasting blood sugar level.  If you are at a normal weight and have a low risk for diabetes, have this test once every three years after 80 years of age.  If you are overweight and have a high risk for diabetes, consider being tested at a younger age or more often. Preventing infection Hepatitis B  If you have a higher risk for hepatitis B, you should be screened for this virus. You are considered at high risk for hepatitis B if:  You were born in a country where hepatitis B is common. Ask your health care provider which countries are considered high risk.  Your parents were born in a high-risk country, and you have not  been immunized against hepatitis B (hepatitis B vaccine).  You have HIV or AIDS.  You use needles to inject street drugs.  You live with someone who has hepatitis B.  You have had sex with someone who has hepatitis B.  You get hemodialysis treatment.  You take certain medicines for conditions, including cancer, organ transplantation, and autoimmune conditions. Hepatitis C  Blood testing is recommended for:  Everyone born from [birth year] through  1963.  Anyone with known risk factors for hepatitis C. Sexually transmitted infections (STIs)  You should be screened for sexually transmitted infections (STIs) including gonorrhea and chlamydia if:  You are sexually active and are younger than 80 years of age.  You are older than 80 years of age and your health care provider tells you that you are at risk for this type of infection.  Your sexual activity has changed since you were last screened and you are at an increased risk for chlamydia or gonorrhea. Ask your health care provider if you are at risk.  If you do not have HIV, but are at risk, it may be recommended that you take a prescription medicine daily to prevent HIV infection. This is called pre-exposure prophylaxis (PrEP). You are considered at risk if:  You are sexually active and do not regularly use condoms or know the HIV status of your partner(s).  You take drugs by injection.  You are sexually active with a partner who has HIV. Talk with your health care provider about whether you are at high risk of being infected with HIV. If you choose to begin PrEP, you should first be tested for HIV. You should then be tested every 3 months for as long as you are taking PrEP. Pregnancy  If you are premenopausal and you may become pregnant, ask your health care provider about preconception counseling.  If you may become pregnant, take 400 to 800 micrograms (mcg) of folic acid every day.  If you want to prevent pregnancy, talk to your health care provider about birth control (contraception). Osteoporosis and menopause  Osteoporosis is a disease in which the bones lose minerals and strength with aging. This can result in serious bone fractures. Your risk for osteoporosis can be identified using a bone density scan.  If you are 87 years of age or older, or if you are at risk for osteoporosis and fractures, ask your health care provider if you should be screened.  Ask your health  care provider whether you should take a calcium or vitamin D supplement to lower your risk for osteoporosis.  Menopause may have certain physical symptoms and risks.  Hormone replacement therapy may reduce some of these symptoms and risks. Talk to your health care provider about whether hormone replacement therapy is right for you. Follow these instructions at home:  Schedule regular health, dental, and eye exams.  Stay current with your immunizations.  Do not use any tobacco products including cigarettes, chewing tobacco, or electronic cigarettes.  If you are pregnant, do not drink alcohol.  If you are breastfeeding, limit how much and how often you drink alcohol.  Limit alcohol intake to no more than 1 drink per day for nonpregnant women. One drink equals 12 ounces of beer, 5 ounces of wine, or 1 ounces of hard liquor.  Do not use street drugs.  Do not share needles.  Ask your health care provider for help if you need support or information about quitting drugs.  Tell your health care provider if you  often feel depressed.  Tell your health care provider if you have ever been abused or do not feel safe at home. This information is not intended to replace advice given to you by your health care provider. Make sure you discuss any questions you have with your health care provider. Document Released: 02/26/2011 Document Revised: 01/19/2016 Document Reviewed: 05/17/2015  2017 [employer]vier

## 2016-09-23 NOTE — Progress Notes (Signed)
Subjective:    Patient ID: [name], female    DOB: Nov 03, 1933, 83 y.o.   MRN: [id number]  HPI The patient is here for follow up.  Hyperlipidemia: She is taking her medication daily. She is compliant with a low fat/cholesterol diet. She is not exercising regularly and is sedentary.  Afib, Hypertension: She is taking her medication daily. She is compliant with a low sodium diet.  She denies chest pain, edema, and regular headaches. She denies recent palpitations, but has them on occasion. She is not exercising regularly.  She does not monitor her blood pressure at home.     Osteopenia:  Your last dexa was 2014.  She agrees to have one if she can have it done today.  She is not taking her calcium and vitamin D daily -  She did not think she needed it daily.  She does not exercise.   Her balance is terrible.  She uses a cane when she goes out and sometimes at home.  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  She denies recent falls.  She has done PT in the past and it did not help. She is not exercising.  She is sedentary.  She has a walker at home but does not use it.    Numbness in hands, achiness in hands:  This usually  occurs at night only.  She has lower back pain in the morning.  She denies neck pain.   Bowels:  She has hemorrhoids.  She has frequent bowel movement but only goes a small amount.  She has discussed this with GI in the past - these symptoms are not new.  She denies blood in the stool.    Her urination is fine.   She is still smoking.  She would like to quit, but does not think she will.  Medications and allergies reviewed with patient and updated if appropriate.  Patient Active Problem List   Diagnosis Date Noted  . Irritable bowel syndrome (IBS) 02/24/2015  . Long term current use of anticoagulant therapy 02/07/2012  . Atrial fibrillation ([place]) 02/01/2012  . S/P dilatation of esophageal stricture 12/25/2010  . Osteopenia of the elderly 06/23/2010  . Vitamin D  deficiency 06/20/2009  . RENAL CYST 12/20/2008  . Depression 06/22/2008  . CIGARETTE SMOKER 12/22/2007  . RESTLESS LEGS SYNDROME 12/22/2007  . Glaucoma 12/22/2007  . DIVERTICULOSIS OF COLON 12/22/2007  . HYPERCHOLESTEROLEMIA 11/25/2007  . Anxiety 11/25/2007  . Essential hypertension 11/25/2007  . Osteoarthritis 11/25/2007    Current Outpatient Prescriptions on File Prior to Visit  Medication Sig Dispense Refill  . apixaban (ELIQUIS) 2.5 MG TABS tablet Take 1 tablet (2.5 mg total) by mouth 2 (two) times daily. 60 tablet 11  . atorvastatin (LIPITOR) 20 MG tablet take 1 tablet by mouth AT 6PM 30 tablet 11  . Cholecalciferol (VITAMIN D) 2000 UNITS CAPS Take 1 capsule by mouth daily as needed. Reported on 08/17/2015    . dorzolamide-timolol (COSOPT) 22.3-6.8 MG/ML ophthalmic solution Place 1 drop into both eyes 2 (two) times daily.    Marland Kitchen latanoprost (XALATAN) 0.005 % ophthalmic solution Place 1 drop into both eyes at bedtime.     Marland Kitchen losartan-hydrochlorothiazide (HYZAAR) 50-12.5 MG tablet take 1 tablet by mouth once daily 30 tablet 11  . metoprolol (LOPRESSOR) 50 MG tablet Take 1 tablet (50 mg total) by mouth 2 (two) times daily. 180 tablet 3  . pilocarpine (PILOCAR) 1 % ophthalmic solution Place 1 drop into the left eye  2 (two) times daily.    . potassium chloride (K-DUR,KLOR-CON) 10 MEQ tablet Take 1 tablet (10 mEq total) by mouth daily. (Patient taking differently: Take 10 mEq by mouth daily as needed. ) 90 tablet 1  . [DISCONTINUED] potassium chloride (K-DUR) 10 MEQ tablet Take 1 tablet (10 mEq total) by mouth daily. 30 tablet 2   No current facility-administered medications on file prior to visit.     Past Medical History:  Diagnosis Date  . Anxiety   . Atrial fibrillation ([place])   . Cigarette smoker   . Depression   . Diverticulosis of colon   . DJD (degenerative joint disease)   . Family history of colon cancer   . Glaucoma   . Hemorrhoids   . History of sebaceous cyst    R  posterior scalp - observation elected 01/2015 after surgical eval  . Hyperlipidemia   . Hypertension   . Osteopenia   . Renal cyst   . Restless leg syndrome   . Vitamin D deficiency     Past Surgical History:  Procedure Laterality Date  . CARDIOVERSION  04/23/2012   Procedure: CARDIOVERSION;  Surgeon: Hillary Bow, MD;  Location: [facility] ENDOSCOPY;  Service: Cardiovascular;  Laterality: N/A;  . CATARACT EXTRACTION    . COLONOSCOPY    . retina surgery x 2  2013  . TOTAL HIP ARTHROPLASTY  2/05   right by Dr. Tonita Cong  . UPPER GASTROINTESTINAL ENDOSCOPY      Social History   Social History  . Marital status: Widowed    Spouse name: N/A  . Number of children: 0  . Years of education: N/A   Occupational History  . Retired    Social History Main Topics  . Smoking status: Light Tobacco Smoker    Packs/day: 0.30  . Smokeless tobacco: Never Used     Comment: 6 cig a day  . Alcohol use No  . Drug use: No  . Sexual activity: Not Currently   Other Topics Concern  . Not on file   Social History Narrative   Exercises 2 times per week   Caffeine use: 1 cup coffee per day   Lives alone, widowed 1978    Family History  Problem Relation Age of Onset  . Hypertension Mother   . Diabetes Mother   . Colon cancer Brother   . Prostate cancer Father   . Breast cancer Other     niece  . Diabetes Brother   . Diabetes Sister     Review of Systems  Constitutional: Negative for appetite change (gets full easily; appetite is low), chills and fever.  Respiratory: Positive for shortness of breath (with exertion). Negative for cough and wheezing.   Cardiovascular: Positive for palpitations. Negative for chest pain and leg swelling.  Gastrointestinal: Positive for constipation. Negative for abdominal pain and blood in stool.  Musculoskeletal: Positive for back pain (lower back pain). Negative for neck pain.  Neurological: Positive for weakness, light-headedness (rare) and numbness (right  leg - intermittent, b/l hands). Negative for headaches.       Objective:   Vitals:   09/24/16 0938  BP: 134/82  Pulse: 87  Resp: 16  Temp: 97.9 F (36.6 C)   Wt Readings from Last 3 Encounters:  09/24/16 126 lb 4 oz (57.3 kg)  08/16/16 128 lb 1.9 oz (58.1 kg)  05/21/16 127 lb 4 oz (57.7 kg)   Body mass index is 19.77 kg/m.   Physical Exam    Constitutional:  Appears well-developed and well-nourished. No distress.  HENT:  Head: Normocephalic and atraumatic.  Neck: Neck supple. No tracheal deviation present. No thyromegaly present.  No cervical lymphadenopathy Cardiovascular: Normal rate, regular rhythm and normal heart sounds.   No murmur heard. No carotid bruit .  No edema Pulmonary/Chest: Effort normal and breath sounds normal. No respiratory distress. No has no wheezes. No rales.  Skin: Skin is warm and dry. Not diaphoretic.  Psychiatric: Normal mood and affect. Behavior is normal.     Assessment & Plan:    See Problem List for Assessment and Plan of chronic medical problems.

## 2016-09-23 NOTE — Patient Instructions (Addendum)

## 2016-09-24 ENCOUNTER — Other Ambulatory Visit (INDEPENDENT_AMBULATORY_CARE_PROVIDER_SITE_OTHER): Payer: Medicare Other

## 2016-09-24 ENCOUNTER — Encounter: Payer: Self-pay | Admitting: Internal Medicine

## 2016-09-24 ENCOUNTER — Ambulatory Visit (INDEPENDENT_AMBULATORY_CARE_PROVIDER_SITE_OTHER)
Admission: RE | Admit: 2016-09-24 | Discharge: 2016-09-24 | Disposition: A | Discharge status: Home / Self Care | Payer: Medicare Other | Source: Ambulatory Visit | Attending: Internal Medicine | Admitting: Internal Medicine

## 2016-09-24 ENCOUNTER — Ambulatory Visit (INDEPENDENT_AMBULATORY_CARE_PROVIDER_SITE_OTHER): Payer: Medicare Other | Admitting: Internal Medicine

## 2016-09-24 VITALS — BP 134/82 | HR 87 | Temp 97.9°F | Resp 16 | Wt 126.2 lb

## 2016-09-24 DIAGNOSIS — E7800 Pure hypercholesterolemia, unspecified: Secondary | ICD-10-CM

## 2016-09-24 DIAGNOSIS — E2839 Other primary ovarian failure: Secondary | ICD-10-CM

## 2016-09-24 DIAGNOSIS — I481 Persistent atrial fibrillation: Secondary | ICD-10-CM

## 2016-09-24 DIAGNOSIS — I4819 Other persistent atrial fibrillation: Secondary | ICD-10-CM

## 2016-09-24 DIAGNOSIS — M8580 Other specified disorders of bone density and structure, unspecified site: Secondary | ICD-10-CM

## 2016-09-24 DIAGNOSIS — I10 Essential (primary) hypertension: Secondary | ICD-10-CM

## 2016-09-24 DIAGNOSIS — E559 Vitamin D deficiency, unspecified: Secondary | ICD-10-CM

## 2016-09-24 DIAGNOSIS — R202 Paresthesia of skin: Secondary | ICD-10-CM | POA: Insufficient documentation

## 2016-09-24 DIAGNOSIS — Z13820 Encounter for screening for osteoporosis: Secondary | ICD-10-CM

## 2016-09-24 DIAGNOSIS — R2689 Other abnormalities of gait and mobility: Secondary | ICD-10-CM | POA: Insufficient documentation

## 2016-09-24 DIAGNOSIS — R200 Anesthesia of skin: Secondary | ICD-10-CM

## 2016-09-24 LAB — CBC WITH DIFFERENTIAL/PLATELET
BASOS ABS: 0 10*3/uL (ref 0.0–0.1)
BASOS PCT: 0.9 % (ref 0.0–3.0)
EOS PCT: 2.9 % (ref 0.0–5.0)
Eosinophils Absolute: 0.1 10*3/uL (ref 0.0–0.7)
HEMATOCRIT: 43.4 % (ref 36.0–46.0)
Hemoglobin: 14.6 g/dL (ref 12.0–15.0)
LYMPHS ABS: 1.3 10*3/uL (ref 0.7–4.0)
LYMPHS PCT: 32.1 % (ref 12.0–46.0)
MCHC: 33.7 g/dL (ref 30.0–36.0)
MCV: 93.1 fl (ref 78.0–100.0)
Monocytes Absolute: 0.4 10*3/uL (ref 0.1–1.0)
Monocytes Relative: 10.4 % (ref 3.0–12.0)
Neutro Abs: 2.1 10*3/uL (ref 1.4–7.7)
Neutrophils Relative %: 53.7 % (ref 43.0–77.0)
Platelets: 136 10*3/uL — ABNORMAL LOW (ref 150.0–400.0)
RBC: 4.66 Mil/uL (ref 3.87–5.11)
RDW: 13.6 % (ref 11.5–15.5)
WBC: 4 10*3/uL (ref 4.0–10.5)

## 2016-09-24 LAB — TSH: TSH: 0.93 u[IU]/mL (ref 0.35–4.50)

## 2016-09-24 LAB — COMPREHENSIVE METABOLIC PANEL
ALT: 17 U/L (ref 0–35)
AST: 21 U/L (ref 0–37)
Albumin: 4.1 g/dL (ref 3.5–5.2)
Alkaline Phosphatase: 69 U/L (ref 39–117)
BILIRUBIN TOTAL: 0.9 mg/dL (ref 0.2–1.2)
BUN: 16 mg/dL (ref 6–23)
CALCIUM: 9.5 mg/dL (ref 8.4–10.5)
CHLORIDE: 108 meq/L (ref 96–112)
CO2: 27 meq/L (ref 19–32)
Creatinine, Ser: 0.89 mg/dL (ref 0.40–1.20)
GFR: 78.31 mL/min (ref >60.00)
GLUCOSE: 78 mg/dL (ref 70–99)
POTASSIUM: 3.8 meq/L (ref 3.5–5.1)
Sodium: 143 mEq/L (ref 135–145)
Total Protein: 6.6 g/dL (ref 6.0–8.3)

## 2016-09-24 MED ORDER — CALCIUM CARBONATE-VITAMIN D 500-200 MG-UNIT PO TABS: 1.0000 | ORAL_TABLET | Freq: Two times a day (BID) | ORAL | Status: DC

## 2016-09-24 NOTE — Assessment & Plan Note (Signed)
BP well controlled Current regimen effective and well tolerated Continue current medications at current doses cmp today

## 2016-09-24 NOTE — Progress Notes (Signed)
Pre visit review using our clinic review tool, if applicable. No additional management support is needed unless otherwise documented below in the visit note. 

## 2016-09-24 NOTE — Assessment & Plan Note (Signed)
Not fasting  Lipid panel has been controlled Continue statin

## 2016-09-24 NOTE — Assessment & Plan Note (Signed)
Intermittent, at night Possible cervical radiculopathy Discussed seeing neuro and imaging She declined both She deferred PT

## 2016-09-24 NOTE — Assessment & Plan Note (Signed)
Occasional palps - none recently Rate controlled Following with cardiology

## 2016-09-24 NOTE — Assessment & Plan Note (Signed)
Stressed taking calcium and vitamin d daily Encouraged increased activity Will get dexa today Avoid falls - she deferred PT

## 2016-09-24 NOTE — Assessment & Plan Note (Signed)
Complains of poor balance, denies falls Uses cane, furniture surfs Has walker - does not use it Sedentary Deferred PT Deferred neuro evaluation

## 2016-09-24 NOTE — Assessment & Plan Note (Signed)
Encouraged to take vitamin d daily - not currently taking

## 2016-09-29 ENCOUNTER — Encounter: Payer: Self-pay | Admitting: Internal Medicine

## 2016-10-01 ENCOUNTER — Encounter: Payer: Self-pay | Admitting: Emergency Medicine

## 2016-10-19 ENCOUNTER — Encounter: Payer: Self-pay | Admitting: Cardiology

## 2016-10-25 NOTE — Progress Notes (Signed)
HPI: FU atrial fibrillation. She has a hx of s/p failed DCCV in 8/13, HTN, HL, tobacco abuse. Echo 01/2012: EF 55-65%, normal wall motion, grade 2 diastolic dysfunction, mild MR. Holter 10/14 showed afib, rate controlled. Since last seen, she has some gait instability but does not fall. There is no dyspnea, chest pain, palpitations or syncope. No bleeding.  Current Outpatient Prescriptions  Medication Sig Dispense Refill  . apixaban (ELIQUIS) 2.5 MG TABS tablet Take 1 tablet (2.5 mg total) by mouth 2 (two) times daily. 60 tablet 11  . atorvastatin (LIPITOR) 20 MG tablet take 1 tablet by mouth AT 6PM 30 tablet 11  . calcium-vitamin D (OSCAL WITH D) 500-200 MG-UNIT tablet Take 1 tablet by mouth 2 (two) times daily.    . Cholecalciferol (VITAMIN D) 2000 UNITS CAPS Take 1 capsule by mouth daily as needed. Reported on 08/17/2015    . dorzolamide-timolol (COSOPT) 22.3-6.8 MG/ML ophthalmic solution Place 1 drop into both eyes 2 (two) times daily.    Marland Kitchen latanoprost (XALATAN) 0.005 % ophthalmic solution Place 1 drop into both eyes at bedtime.     Marland Kitchen losartan-hydrochlorothiazide (HYZAAR) 50-12.5 MG tablet take 1 tablet by mouth once daily 30 tablet 11  . metoprolol (LOPRESSOR) 50 MG tablet Take 1 tablet (50 mg total) by mouth 2 (two) times daily. 180 tablet 3  . pilocarpine (PILOCAR) 1 % ophthalmic solution Place 1 drop into the left eye 2 (two) times daily.    . potassium chloride (K-DUR,KLOR-CON) 10 MEQ tablet Take 1 tablet (10 mEq total) by mouth daily. (Patient taking differently: Take 10 mEq by mouth daily as needed. ) 90 tablet 1   No current facility-administered medications for this visit.      Past Medical History:  Diagnosis Date  . Anxiety   . Atrial fibrillation ([place])   . Cigarette smoker   . Depression   . Diverticulosis of colon   . DJD (degenerative joint disease)   . Family history of colon cancer   . Glaucoma   . Hemorrhoids   . History of sebaceous cyst    R posterior  scalp - observation elected 01/2015 after surgical eval  . Hyperlipidemia   . Hypertension   . Osteopenia   . Renal cyst   . Restless leg syndrome   . Vitamin D deficiency     Past Surgical History:  Procedure Laterality Date  . CARDIOVERSION  04/23/2012   Procedure: CARDIOVERSION;  Surgeon: Hillary Bow, MD;  Location: [facility] ENDOSCOPY;  Service: Cardiovascular;  Laterality: N/A;  . CATARACT EXTRACTION    . COLONOSCOPY    . retina surgery x 2  2013  . TOTAL HIP ARTHROPLASTY  2/05   right by Dr. Tonita Cong  . UPPER GASTROINTESTINAL ENDOSCOPY      Social History   Social History  . Marital status: Widowed    Spouse name: N/A  . Number of children: 0  . Years of education: N/A   Occupational History  . Retired    Social History Main Topics  . Smoking status: Light Tobacco Smoker    Packs/day: 0.30  . Smokeless tobacco: Never Used     Comment: 6 cig a day  . Alcohol use No  . Drug use: No  . Sexual activity: Not Currently   Other Topics Concern  . Not on file   Social History Narrative   Exercises 2 times per week   Caffeine use: 1 cup coffee per day   Lives  alone, widowed 1978    Family History  Problem Relation Age of Onset  . Hypertension Mother   . Diabetes Mother   . Colon cancer Brother   . Prostate cancer Father   . Breast cancer Other     niece  . Diabetes Brother   . Diabetes Sister     ROS: no fevers or chills, productive cough, hemoptysis, dysphasia, odynophagia, melena, hematochezia, dysuria, hematuria, rash, seizure activity, orthopnea, PND, pedal edema, claudication. Remaining systems are negative.  Physical Exam: Well-developed thin in no acute distress.  Skin is warm and dry.  HEENT is normal.  Neck is supple.  Chest is clear to auscultation with normal expansion.  Cardiovascular exam is irregular Abdominal exam nontender or distended. No masses palpated. Extremities show no edema. neuro grossly intact  ECG- atrial fibrillation at a  rate of 99. Inferior lateral T-wave inversion; personally reviewed  A/P  1 Permanent atrial fibrillation-continue metoprolol for rate control. Continue apixaban.   2 tobacco abuse-patient counseled on discontinuing.  3 hyperlipidemia-continue statin.  4 hypertension-blood pressure controlled. Continue present medications.  Kirk Ruths, MD

## 2016-10-29 ENCOUNTER — Ambulatory Visit (INDEPENDENT_AMBULATORY_CARE_PROVIDER_SITE_OTHER): Payer: Medicare Other | Admitting: Cardiology

## 2016-10-29 ENCOUNTER — Encounter: Payer: Self-pay | Admitting: Cardiology

## 2016-10-29 VITALS — BP 136/64 | HR 99 | Ht 66.0 in | Wt 127.8 lb

## 2016-10-29 DIAGNOSIS — E7800 Pure hypercholesterolemia, unspecified: Secondary | ICD-10-CM

## 2016-10-29 DIAGNOSIS — I4891 Unspecified atrial fibrillation: Secondary | ICD-10-CM

## 2016-10-29 DIAGNOSIS — F17200 Nicotine dependence, unspecified, uncomplicated: Secondary | ICD-10-CM | POA: Diagnosis not present

## 2016-10-29 DIAGNOSIS — I10 Essential (primary) hypertension: Secondary | ICD-10-CM

## 2016-10-29 NOTE — Patient Instructions (Signed)
Your physician wants you to follow-up in: 6 MONTHS WITH DR CRENSHAW You will receive a reminder letter in the mail two months in advance. If you don't receive a letter, please call our office to schedule the follow-up appointment.   If you need a refill on your cardiac medications before your next appointment, please call your pharmacy.  

## 2016-12-24 DIAGNOSIS — H401113 Primary open-angle glaucoma, right eye, severe stage: Secondary | ICD-10-CM | POA: Diagnosis not present

## 2016-12-24 DIAGNOSIS — H401122 Primary open-angle glaucoma, left eye, moderate stage: Secondary | ICD-10-CM | POA: Diagnosis not present

## 2017-01-07 ENCOUNTER — Other Ambulatory Visit: Payer: Self-pay | Admitting: Cardiology

## 2017-01-14 ENCOUNTER — Ambulatory Visit (INDEPENDENT_AMBULATORY_CARE_PROVIDER_SITE_OTHER): Payer: Medicare Other | Admitting: Pharmacist

## 2017-01-14 DIAGNOSIS — Z7901 Long term (current) use of anticoagulants: Secondary | ICD-10-CM | POA: Diagnosis not present

## 2017-01-14 DIAGNOSIS — I4891 Unspecified atrial fibrillation: Secondary | ICD-10-CM

## 2017-01-14 LAB — BASIC METABOLIC PANEL
BUN/Creatinine Ratio: 17 (ref 12–28)
BUN: 17 mg/dL (ref 8–27)
CALCIUM: 9.2 mg/dL (ref 8.7–10.3)
CO2: 23 mmol/L (ref 18–29)
CREATININE: 0.98 mg/dL (ref 0.57–1.00)
Chloride: 106 mmol/L (ref 96–106)
GFR calc Af Amer: 63 mL/min/{1.73_m2} (ref >59)
GFR, EST NON AFRICAN AMERICAN: 54 mL/min/{1.73_m2} — AB (ref >59)
GLUCOSE: 56 mg/dL — AB (ref 65–99)
Potassium: 3.7 mmol/L (ref 3.5–5.2)
SODIUM: 143 mmol/L (ref 134–144)

## 2017-01-14 LAB — CBC
HEMATOCRIT: 42.1 % (ref 34.0–46.6)
Hemoglobin: 14.3 g/dL (ref 11.1–15.9)
MCH: 31.6 pg (ref 26.6–33.0)
MCHC: 34 g/dL (ref 31.5–35.7)
MCV: 93 fL (ref 79–97)
Platelets: 149 10*3/uL — ABNORMAL LOW (ref 150–379)
RBC: 4.52 x10E6/uL (ref 3.77–5.28)
RDW: 13.6 % (ref 12.3–15.4)
WBC: 3.4 10*3/uL (ref 3.4–10.8)

## 2017-01-14 NOTE — Progress Notes (Signed)
Pt was started on Eliquis for afib on 06/22/15 by Dr Stanford Breed. She currently takes 2.5mg  BID however has taken higher dose in the past.  Reviewed patients medication list.  Pt is not currently on any combined P-gp and strong CYP3A4 inhibitors/inducers (ketoconazole, traconazole, ritonavir, carbamazepine, phenytoin, rifampin, St. John's wort).  Reviewed labs.  SCr 0.98, Weight 125 lb 8 oz, age 83.  Dose appropriate based on age, weight, and SCr.  Hgb and HCT both wnl at 14.3 and 42.1, respectively.  A full discussion of the nature of anticoagulants has been carried out.  A benefit/risk analysis has been presented to the patient, so that they understand the justification for choosing anticoagulation with Eliquis at this time.  The need for compliance is stressed.  Pt is aware to take the medication twice daily.  Side effects of potential bleeding are discussed, including unusual colored urine or stools, coughing up blood or coffee ground emesis, nose bleeds or serious fall or head trauma.  Discussed signs and symptoms of stroke. The patient should avoid any OTC items containing aspirin or ibuprofen.  Avoid alcohol consumption.   Call if any signs of abnormal bleeding.  Discussed financial obligations and resolved any difficulty in obtaining medication.

## 2017-02-11 ENCOUNTER — Other Ambulatory Visit: Payer: Self-pay | Admitting: Obstetrics and Gynecology

## 2017-02-11 DIAGNOSIS — E041 Nontoxic single thyroid nodule: Secondary | ICD-10-CM

## 2017-02-11 DIAGNOSIS — Z01419 Encounter for gynecological examination (general) (routine) without abnormal findings: Secondary | ICD-10-CM | POA: Diagnosis not present

## 2017-02-11 DIAGNOSIS — Z1231 Encounter for screening mammogram for malignant neoplasm of breast: Secondary | ICD-10-CM | POA: Diagnosis not present

## 2017-02-11 DIAGNOSIS — Z681 Body mass index (BMI) 19 or less, adult: Secondary | ICD-10-CM | POA: Diagnosis not present

## 2017-02-18 ENCOUNTER — Ambulatory Visit
Admission: RE | Admit: 2017-02-18 | Discharge: 2017-02-18 | Disposition: A | Discharge status: Home / Self Care | Payer: Medicare Other | Source: Ambulatory Visit | Attending: Obstetrics and Gynecology | Admitting: Obstetrics and Gynecology

## 2017-02-18 DIAGNOSIS — E041 Nontoxic single thyroid nodule: Secondary | ICD-10-CM

## 2017-02-18 DIAGNOSIS — E042 Nontoxic multinodular goiter: Secondary | ICD-10-CM | POA: Diagnosis not present

## 2017-02-26 ENCOUNTER — Telehealth: Payer: Self-pay | Admitting: Cardiology

## 2017-02-26 ENCOUNTER — Other Ambulatory Visit: Payer: Self-pay | Admitting: Cardiology

## 2017-02-26 ENCOUNTER — Other Ambulatory Visit: Payer: Self-pay | Admitting: Obstetrics and Gynecology

## 2017-02-26 DIAGNOSIS — E041 Nontoxic single thyroid nodule: Secondary | ICD-10-CM

## 2017-02-26 NOTE — Telephone Encounter (Signed)
Patient is scheduled for thyroid biopsy and they are asking for clearance for the patient to hold her eliquis for 2 days prior to the procedure. Will forward for dr Stanford Breed review

## 2017-02-26 NOTE — Telephone Encounter (Signed)
Hold apixaban 2 days prior to procedure and resume day after [name]  

## 2017-03-07 ENCOUNTER — Ambulatory Visit
Admission: RE | Admit: 2017-03-07 | Discharge: 2017-03-07 | Disposition: A | Discharge status: Home / Self Care | Payer: Medicare Other | Source: Ambulatory Visit | Attending: Obstetrics and Gynecology | Admitting: Obstetrics and Gynecology

## 2017-03-07 ENCOUNTER — Other Ambulatory Visit (HOSPITAL_COMMUNITY)
Admission: RE | Admit: 2017-03-07 | Discharge: 2017-03-07 | Disposition: A | Discharge status: Home / Self Care | Payer: Medicare Other | Source: Ambulatory Visit | Attending: Radiology | Admitting: Radiology

## 2017-03-07 DIAGNOSIS — E041 Nontoxic single thyroid nodule: Secondary | ICD-10-CM

## 2017-03-07 DIAGNOSIS — E042 Nontoxic multinodular goiter: Secondary | ICD-10-CM | POA: Diagnosis not present

## 2017-03-23 NOTE — Patient Instructions (Addendum)
   All other Health Maintenance issues reviewed.   All recommended immunizations and age-appropriate screenings are up-to-date or discussed.  No immunizations administered today.   Medications reviewed and updated.  No changes recommended at this time.    Please followup in 6 months   

## 2017-03-23 NOTE — Progress Notes (Signed)
Subjective:    Patient ID: [name], female    DOB: 24-Feb-1934, 83 y.o.   MRN: [id number]  HPI The patient is here for follow up.    Hypertension: She is taking her medication daily. She is compliant with a low sodium diet.  She denies chest pain, edema, shortness of breath and regular headaches. She is not exercising regularly.  She does not monitor her blood pressure at home.    Hyperlipidemia: She is taking her medication daily. She is compliant with a low fat/cholesterol diet. She is not exercising regularly. She denies myalgias.   Afib: She is following with cardiology.  She feels palpitations that are short lived daily - usually in the morning. She denies SOB and chest pain.  She is sedentary and does not exercise.    Thyroid nodule: she saw her gyn and was found to have a nodule.  She had an [place] and then biopsy which was negative.    Medications and allergies reviewed with patient and updated if appropriate.  Patient Active Problem List   Diagnosis Date Noted  . Thyroid nodule 03/25/2017  . Numbness in both hands 09/24/2016  . Poor balance 09/24/2016  . Irritable bowel syndrome (IBS) 02/24/2015  . Long term current use of anticoagulant therapy 02/07/2012  . Atrial fibrillation ([place]) 02/01/2012  . S/P dilatation of esophageal stricture 12/25/2010  . Osteopenia of the elderly 06/23/2010  . Vitamin D deficiency 06/20/2009  . RENAL CYST 12/20/2008  . Depression 06/22/2008  . CIGARETTE SMOKER 12/22/2007  . RESTLESS LEGS SYNDROME 12/22/2007  . Glaucoma 12/22/2007  . DIVERTICULOSIS OF COLON 12/22/2007  . HYPERCHOLESTEROLEMIA 11/25/2007  . Anxiety 11/25/2007  . Essential hypertension 11/25/2007  . Osteoarthritis 11/25/2007    Current Outpatient Prescriptions on File Prior to Visit  Medication Sig Dispense Refill  . atorvastatin (LIPITOR) 20 MG tablet take 1 tablet by mouth AT 6PM 30 tablet 11  . calcium-vitamin D (OSCAL WITH D) 500-200 MG-UNIT tablet Take 1  tablet by mouth 2 (two) times daily.    . Cholecalciferol (VITAMIN D) 2000 UNITS CAPS Take 1 capsule by mouth daily as needed. Reported on 08/17/2015    . dorzolamide-timolol (COSOPT) 22.3-6.8 MG/ML ophthalmic solution Place 1 drop into both eyes 2 (two) times daily.    Marland Kitchen ELIQUIS 2.5 MG TABS tablet take 1 tablet by mouth twice a day 180 tablet 1  . latanoprost (XALATAN) 0.005 % ophthalmic solution Place 1 drop into both eyes at bedtime.     Marland Kitchen losartan-hydrochlorothiazide (HYZAAR) 50-12.5 MG tablet take 1 tablet by mouth once daily 30 tablet 11  . metoprolol (LOPRESSOR) 50 MG tablet Take 1 tablet (50 mg total) by mouth 2 (two) times daily. 180 tablet 3  . pilocarpine (PILOCAR) 1 % ophthalmic solution Place 1 drop into the left eye 2 (two) times daily.    . potassium chloride (K-DUR,KLOR-CON) 10 MEQ tablet Take 1 tablet (10 mEq total) by mouth daily. (Patient taking differently: Take 10 mEq by mouth daily as needed. ) 90 tablet 1  . [DISCONTINUED] potassium chloride (K-DUR) 10 MEQ tablet Take 1 tablet (10 mEq total) by mouth daily. 30 tablet 2   No current facility-administered medications on file prior to visit.     Past Medical History:  Diagnosis Date  . Anxiety   . Atrial fibrillation ([place])   . Cigarette smoker   . Depression   . Diverticulosis of colon   . DJD (degenerative joint disease)   . Family history  of colon cancer   . Glaucoma   . Hemorrhoids   . History of sebaceous cyst    R posterior scalp - observation elected 01/2015 after surgical eval  . Hyperlipidemia   . Hypertension   . Osteopenia   . Renal cyst   . Restless leg syndrome   . Vitamin D deficiency     Past Surgical History:  Procedure Laterality Date  . CARDIOVERSION  04/23/2012   Procedure: CARDIOVERSION;  Surgeon: Hillary Bow, MD;  Location: [facility] ENDOSCOPY;  Service: Cardiovascular;  Laterality: N/A;  . CATARACT EXTRACTION    . COLONOSCOPY    . retina surgery x 2  2013  . TOTAL HIP ARTHROPLASTY  2/05     right by Dr. Tonita Cong  . UPPER GASTROINTESTINAL ENDOSCOPY      Social History   Social History  . Marital status: Widowed    Spouse name: N/A  . Number of children: 0  . Years of education: N/A   Occupational History  . Retired    Social History Main Topics  . Smoking status: Light Tobacco Smoker    Packs/day: 0.30  . Smokeless tobacco: Never Used     Comment: 6 cig a day  . Alcohol use No  . Drug use: No  . Sexual activity: Not Currently   Other Topics Concern  . Not on file   Social History Narrative   Exercises 2 times per week   Caffeine use: 1 cup coffee per day   Lives alone, widowed 1978    Family History  Problem Relation Age of Onset  . Hypertension Mother   . Diabetes Mother   . Colon cancer Brother   . Prostate cancer Father   . Breast cancer Other        niece  . Diabetes Brother   . Diabetes Sister     Review of Systems  Constitutional: Negative for chills and fever.  Respiratory: Negative for cough, shortness of breath and wheezing.   Cardiovascular: Positive for palpitations (from Afib - daily). Negative for chest pain and leg swelling.  Gastrointestinal: Negative for abdominal pain and nausea.       No gerd  Neurological: Positive for light-headedness (occ) and headaches (rare). Negative for dizziness.       Objective:   Vitals:   03/25/17 1026  BP: (!) 156/88  Pulse: 82  Resp: 16  Temp: 98.2 F (36.8 C)   Wt Readings from Last 3 Encounters:  03/25/17 127 lb (57.6 kg)  01/14/17 125 lb 8 oz (56.9 kg)  10/29/16 127 lb 12.8 oz (58 kg)   Body mass index is 20.5 kg/m.   Physical Exam    Constitutional: Appears well-developed and well-nourished. No distress.  HENT:  Head: Normocephalic and atraumatic.  Neck: Neck supple. No tracheal deviation present. No thyromegaly present.  No cervical lymphadenopathy Cardiovascular: Normal rate, regularly irregular rhythm and normal heart sounds.  No murmur heard. No carotid bruit .  Trace  b/l LE edema Pulmonary/Chest:  No respiratory distress. Occasional, mild exp wheeze. No rales.  Skin: Skin is warm and dry. Not diaphoretic.  Psychiatric: Normal mood and affect. Behavior is normal.      Assessment & Plan:    See Problem List for Assessment and Plan of chronic medical problems.

## 2017-03-25 ENCOUNTER — Ambulatory Visit (INDEPENDENT_AMBULATORY_CARE_PROVIDER_SITE_OTHER): Payer: Medicare Other | Admitting: Internal Medicine

## 2017-03-25 ENCOUNTER — Encounter: Payer: Self-pay | Admitting: Internal Medicine

## 2017-03-25 VITALS — BP 156/88 | HR 82 | Temp 98.2°F | Resp 16 | Wt 127.0 lb

## 2017-03-25 DIAGNOSIS — E7800 Pure hypercholesterolemia, unspecified: Secondary | ICD-10-CM | POA: Diagnosis not present

## 2017-03-25 DIAGNOSIS — I10 Essential (primary) hypertension: Secondary | ICD-10-CM

## 2017-03-25 DIAGNOSIS — I481 Persistent atrial fibrillation: Secondary | ICD-10-CM

## 2017-03-25 DIAGNOSIS — E041 Nontoxic single thyroid nodule: Secondary | ICD-10-CM | POA: Diagnosis not present

## 2017-03-25 DIAGNOSIS — I4819 Other persistent atrial fibrillation: Secondary | ICD-10-CM

## 2017-03-25 DIAGNOSIS — F17200 Nicotine dependence, unspecified, uncomplicated: Secondary | ICD-10-CM | POA: Diagnosis not present

## 2017-03-25 NOTE — Assessment & Plan Note (Signed)
BP Readings from Last 3 Encounters:  03/25/17 (!) 156/88  10/29/16 136/64  09/24/16 134/82   Overall controlled Current regimen effective and well tolerated Continue current medications at current doses

## 2017-03-25 NOTE — Assessment & Plan Note (Signed)
Had biopsy which was neg 02/2017

## 2017-03-25 NOTE — Assessment & Plan Note (Signed)
Cholesterol well controlled Continue statin 

## 2017-03-25 NOTE — Assessment & Plan Note (Signed)
Up to date with cardiology Feels palpitations daily - transient No SOB or CP Continue eliquis, lopressor

## 2017-03-25 NOTE — Assessment & Plan Note (Signed)
Advised smoking cessation - she is not committed to quitting.

## 2017-04-02 ENCOUNTER — Other Ambulatory Visit: Payer: Self-pay | Admitting: Internal Medicine

## 2017-05-09 DIAGNOSIS — E041 Nontoxic single thyroid nodule: Secondary | ICD-10-CM | POA: Diagnosis not present

## 2017-05-15 ENCOUNTER — Telehealth: Payer: Self-pay | Admitting: Cardiology

## 2017-05-15 NOTE — Telephone Encounter (Signed)
I have no information on this powder; would avoid; tylenol best choice in combination with apixaban Kirk Ruths

## 2017-05-15 NOTE — Telephone Encounter (Signed)
Returned call to patient no answer.LMTC. 

## 2017-05-15 NOTE — Telephone Encounter (Signed)
S/w pt she states that has increasing stiff arms,hands and joints-she is taking Eliquis and she would like to start taking [employer] [place] joint support supplement powder  isotonic formula. She says the the insert that comes with the power says not to take if you take warfarin, pt was wondering if this means that she cannot take because she is taking Eliquis. She says that tylenol does not work for her. She wold like to try this power, have you heard of this powder?

## 2017-05-15 NOTE — Telephone Encounter (Signed)
New Message ° ° pt verbalized that she is returning call for rn °

## 2017-05-15 NOTE — Telephone Encounter (Signed)
Please call question about a powder for joint pain,she is concerned because she is on Eliquis.

## 2017-05-16 NOTE — Telephone Encounter (Signed)
Left message for patient of dr crenshaw's recommendations. 

## 2017-05-22 ENCOUNTER — Other Ambulatory Visit: Payer: Self-pay | Admitting: Cardiology

## 2017-05-22 DIAGNOSIS — I4891 Unspecified atrial fibrillation: Secondary | ICD-10-CM

## 2017-06-10 ENCOUNTER — Ambulatory Visit (INDEPENDENT_AMBULATORY_CARE_PROVIDER_SITE_OTHER): Payer: Medicare Other | Admitting: Internal Medicine

## 2017-06-10 ENCOUNTER — Encounter: Payer: Self-pay | Admitting: Internal Medicine

## 2017-06-10 VITALS — BP 150/92 | HR 89 | Temp 98.1°F | Resp 16 | Wt 129.0 lb

## 2017-06-10 DIAGNOSIS — M5412 Radiculopathy, cervical region: Secondary | ICD-10-CM | POA: Insufficient documentation

## 2017-06-10 DIAGNOSIS — M19019 Primary osteoarthritis, unspecified shoulder: Secondary | ICD-10-CM | POA: Diagnosis not present

## 2017-06-10 DIAGNOSIS — Z23 Encounter for immunization: Secondary | ICD-10-CM | POA: Diagnosis not present

## 2017-06-10 NOTE — Patient Instructions (Addendum)
  You have arthritis of the sternoclavicular joints which is where your sternum and clavicles joint together.  This is not concerning.  If your symptoms change please let me know.

## 2017-06-10 NOTE — Assessment & Plan Note (Signed)
Symptoms consistent with cervical radiculopathy Symptoms have improved and she doe snot feel she needs further evaluation or treatment at this time

## 2017-06-10 NOTE — Progress Notes (Signed)
Subjective:    Patient ID: [name], female    DOB: Jan 26, 1934, 83 y.o.   MRN: [id number]  HPI The patient is here for an acute visit.  She noticed a knot two weeks ago on each medial aspect of her clavicles.  She is unsure if it was there previously.  She denies pain.  She thinks the right knot got a little larger since she first noticed it, but is not sure.  She denies any injuries to the area.  She recently had a biopsy of her thyroid, which was negative.     She continues to have intermittent numbness/tingling in her hands, but it is better.  She had neck pain at times.  Her symptoms have improved with doing certain exercises at home.   Medications and allergies reviewed with patient and updated if appropriate.  Patient Active Problem List   Diagnosis Date Noted  . Thyroid nodule 03/25/2017  . Numbness in both hands 09/24/2016  . Poor balance 09/24/2016  . Irritable bowel syndrome (IBS) 02/24/2015  . Long term current use of anticoagulant therapy 02/07/2012  . Atrial fibrillation ([place]) 02/01/2012  . S/P dilatation of esophageal stricture 12/25/2010  . Osteopenia of the elderly 06/23/2010  . Vitamin D deficiency 06/20/2009  . RENAL CYST 12/20/2008  . Depression 06/22/2008  . CIGARETTE SMOKER 12/22/2007  . RESTLESS LEGS SYNDROME 12/22/2007  . Glaucoma 12/22/2007  . DIVERTICULOSIS OF COLON 12/22/2007  . HYPERCHOLESTEROLEMIA 11/25/2007  . Anxiety 11/25/2007  . Essential hypertension 11/25/2007  . Osteoarthritis 11/25/2007    Current Outpatient Prescriptions on File Prior to Visit  Medication Sig Dispense Refill  . atorvastatin (LIPITOR) 20 MG tablet take 1 tablet by mouth AT 6 PM 30 tablet 11  . calcium-vitamin D (OSCAL WITH D) 500-200 MG-UNIT tablet Take 1 tablet by mouth 2 (two) times daily.    . Cholecalciferol (VITAMIN D) 2000 UNITS CAPS Take 1 capsule by mouth daily as needed. Reported on 08/17/2015    . dorzolamide-timolol (COSOPT) 22.3-6.8 MG/ML ophthalmic  solution Place 1 drop into both eyes 2 (two) times daily.    Marland Kitchen ELIQUIS 2.5 MG TABS tablet take 1 tablet by mouth twice a day 180 tablet 1  . latanoprost (XALATAN) 0.005 % ophthalmic solution Place 1 drop into both eyes at bedtime.     Marland Kitchen losartan-hydrochlorothiazide (HYZAAR) 50-12.5 MG tablet take 1 tablet by mouth once daily 30 tablet 11  . metoprolol tartrate (LOPRESSOR) 50 MG tablet take 1 tablet by mouth twice a day 180 tablet 3  . pilocarpine (PILOCAR) 1 % ophthalmic solution Place 1 drop into the left eye 2 (two) times daily.    . potassium chloride (K-DUR,KLOR-CON) 10 MEQ tablet Take 1 tablet (10 mEq total) by mouth daily. (Patient taking differently: Take 10 mEq by mouth daily as needed. ) 90 tablet 1  . [DISCONTINUED] potassium chloride (K-DUR) 10 MEQ tablet Take 1 tablet (10 mEq total) by mouth daily. 30 tablet 2   No current facility-administered medications on file prior to visit.     Past Medical History:  Diagnosis Date  . Anxiety   . Atrial fibrillation ([place])   . Cigarette smoker   . Depression   . Diverticulosis of colon   . DJD (degenerative joint disease)   . Family history of colon cancer   . Glaucoma   . Hemorrhoids   . History of sebaceous cyst    R posterior scalp - observation elected 01/2015 after surgical eval  . Hyperlipidemia   .  Hypertension   . Osteopenia   . Renal cyst   . Restless leg syndrome   . Vitamin D deficiency     Past Surgical History:  Procedure Laterality Date  . CARDIOVERSION  04/23/2012   Procedure: CARDIOVERSION;  Surgeon: Hillary Bow, MD;  Location: [facility] ENDOSCOPY;  Service: Cardiovascular;  Laterality: N/A;  . CATARACT EXTRACTION    . COLONOSCOPY    . retina surgery x 2  2013  . TOTAL HIP ARTHROPLASTY  2/05   right by Dr. Tonita Cong  . UPPER GASTROINTESTINAL ENDOSCOPY      Social History   Social History  . Marital status: Widowed    Spouse name: N/A  . Number of children: 0  . Years of education: N/A   Occupational  History  . Retired    Social History Main Topics  . Smoking status: Light Tobacco Smoker    Packs/day: 0.30  . Smokeless tobacco: Never Used     Comment: 6 cig a day  . Alcohol use No  . Drug use: No  . Sexual activity: Not Currently   Other Topics Concern  . None   Social History Narrative   Exercises 2 times per week   Caffeine use: 1 cup coffee per day   Lives alone, widowed 1978    Family History  Problem Relation Age of Onset  . Hypertension Mother   . Diabetes Mother   . Colon cancer Brother   . Prostate cancer Father   . Breast cancer Other        niece  . Diabetes Brother   . Diabetes Sister     Review of Systems  Constitutional: Negative for fever.  Cardiovascular: Negative for chest pain.  Musculoskeletal: Positive for neck pain and neck stiffness.  Skin: Negative for color change and rash.  Neurological: Positive for numbness (hands intermittently). Negative for dizziness, light-headedness and headaches.       Objective:   Vitals:   06/10/17 1109  BP: (!) 150/92  Pulse: 89  Resp: 16  Temp: 98.1 F (36.7 C)  SpO2: 98%   Wt Readings from Last 3 Encounters:  06/10/17 129 lb (58.5 kg)  03/25/17 127 lb (57.6 kg)  01/14/17 125 lb 8 oz (56.9 kg)   Body mass index is 20.82 kg/m.   Physical Exam    Constitutional: Appears well-developed and well-nourished. No distress.  HENT:  Head: Normocephalic and atraumatic.  Neck: Neck supple. No tracheal deviation present. Thyroid nodules, No thyromegaly present.  No cervical, mandibular, auricular or occipital lymphadenopathy Chest: bony prominence end of clavicle near sternum Right > left - non tender, non mobile Msk: no C-spine tenderness Skin: Skin is warm and dry. Not diaphoretic. No erythema or color change.     Assessment & Plan:    See Problem List for Assessment and Plan of chronic medical problems.

## 2017-06-10 NOTE — Assessment & Plan Note (Signed)
Asymptomatic - no pain Bony prominence R > L - not mobile, no mass, no swelling or skin changes reassured

## 2017-06-13 NOTE — Progress Notes (Signed)
HPI: FU atrial fibrillation. She has a hx of s/p failed DCCV in 8/13, HTN, HL, tobacco abuse. Echo 01/2012: EF 55-65%, normal wall motion, grade 2 diastolic dysfunction, mild MR. Holter 10/14 showed afib, rate controlled. Since last seen, she denies dyspnea or chest pain. No palpitations or syncope. No bleeding. Some weakness and fatigue.  Current Outpatient Prescriptions  Medication Sig Dispense Refill  . atorvastatin (LIPITOR) 20 MG tablet take 1 tablet by mouth AT 6 PM 30 tablet 11  . calcium-vitamin D (OSCAL WITH D) 500-200 MG-UNIT tablet Take 1 tablet by mouth 2 (two) times daily.    . Cholecalciferol (VITAMIN D) 2000 UNITS CAPS Take 1 capsule by mouth daily as needed. Reported on 08/17/2015    . dorzolamide-timolol (COSOPT) 22.3-6.8 MG/ML ophthalmic solution Place 1 drop into both eyes 2 (two) times daily.    Marland Kitchen ELIQUIS 2.5 MG TABS tablet take 1 tablet by mouth twice a day 180 tablet 1  . latanoprost (XALATAN) 0.005 % ophthalmic solution Place 1 drop into both eyes at bedtime.     Marland Kitchen losartan-hydrochlorothiazide (HYZAAR) 50-12.5 MG tablet take 1 tablet by mouth once daily 30 tablet 11  . metoprolol tartrate (LOPRESSOR) 50 MG tablet take 1 tablet by mouth twice a day 180 tablet 3  . pilocarpine (PILOCAR) 1 % ophthalmic solution Place 1 drop into the left eye 2 (two) times daily.    . potassium chloride (K-DUR,KLOR-CON) 10 MEQ tablet Take 1 tablet (10 mEq total) by mouth daily. (Patient taking differently: Take 10 mEq by mouth daily as needed. ) 90 tablet 1   No current facility-administered medications for this visit.      Past Medical History:  Diagnosis Date  . Anxiety   . Atrial fibrillation ([place])   . Cigarette smoker   . Depression   . Diverticulosis of colon   . DJD (degenerative joint disease)   . Family history of colon cancer   . Glaucoma   . Hemorrhoids   . History of sebaceous cyst    R posterior scalp - observation elected 01/2015 after surgical eval  .  Hyperlipidemia   . Hypertension   . Osteopenia   . Renal cyst   . Restless leg syndrome   . Vitamin D deficiency     Past Surgical History:  Procedure Laterality Date  . CARDIOVERSION  04/23/2012   Procedure: CARDIOVERSION;  Surgeon: Hillary Bow, MD;  Location: [facility] ENDOSCOPY;  Service: Cardiovascular;  Laterality: N/A;  . CATARACT EXTRACTION    . COLONOSCOPY    . retina surgery x 2  2013  . TOTAL HIP ARTHROPLASTY  2/05   right by Dr. Tonita Cong  . UPPER GASTROINTESTINAL ENDOSCOPY      Social History   Social History  . Marital status: Widowed    Spouse name: N/A  . Number of children: 0  . Years of education: N/A   Occupational History  . Retired    Social History Main Topics  . Smoking status: Light Tobacco Smoker    Packs/day: 0.30  . Smokeless tobacco: Never Used     Comment: 6 cig a day  . Alcohol use No  . Drug use: No  . Sexual activity: Not Currently   Other Topics Concern  . Not on file   Social History Narrative   Exercises 2 times per week   Caffeine use: 1 cup coffee per day   Lives alone, widowed 1978    Family History  Problem Relation  Age of Onset  . Hypertension Mother   . Diabetes Mother   . Colon cancer Brother   . Prostate cancer Father   . Breast cancer Other        niece  . Diabetes Brother   . Diabetes Sister     ROS: no fevers or chills, productive cough, hemoptysis, dysphasia, odynophagia, melena, hematochezia, dysuria, hematuria, rash, seizure activity, orthopnea, PND, pedal edema, claudication. Remaining systems are negative.  Physical Exam: Well-developed well-nourished in no acute distress.  Skin is warm and dry.  HEENT is normal.  Neck is supple.  Chest with diminished BS throughout Cardiovascular exam is irregular Abdominal exam nontender or distended. No masses palpated. Extremities show no edema. neuro grossly intact   A/P  1 Permanent atrial fibrillation-patient doing well with no symptoms. Plan rate control  and anticoagulation. Continue metoprolol and apixaban. Check hemoglobin and renal function.  2 hypertension-blood pressure is controlled. Continue present medications.  3 hyperlipidemia-continue statin. Check lipids and liver.   4 tobacco abuse-patient counseled on discontinuing.  Kirk Ruths, MD

## 2017-06-19 ENCOUNTER — Ambulatory Visit (INDEPENDENT_AMBULATORY_CARE_PROVIDER_SITE_OTHER): Payer: Medicare Other | Admitting: Cardiology

## 2017-06-19 ENCOUNTER — Encounter: Payer: Self-pay | Admitting: Cardiology

## 2017-06-19 VITALS — BP 120/78 | HR 80 | Ht 66.0 in | Wt 129.0 lb

## 2017-06-19 DIAGNOSIS — I4821 Permanent atrial fibrillation: Secondary | ICD-10-CM

## 2017-06-19 DIAGNOSIS — E7800 Pure hypercholesterolemia, unspecified: Secondary | ICD-10-CM | POA: Diagnosis not present

## 2017-06-19 DIAGNOSIS — F17200 Nicotine dependence, unspecified, uncomplicated: Secondary | ICD-10-CM | POA: Diagnosis not present

## 2017-06-19 DIAGNOSIS — I10 Essential (primary) hypertension: Secondary | ICD-10-CM | POA: Diagnosis not present

## 2017-06-19 DIAGNOSIS — I482 Chronic atrial fibrillation: Secondary | ICD-10-CM

## 2017-06-19 DIAGNOSIS — I481 Persistent atrial fibrillation: Secondary | ICD-10-CM | POA: Diagnosis not present

## 2017-06-19 LAB — BASIC METABOLIC PANEL
BUN/Creatinine Ratio: 17 (ref 12–28)
BUN: 15 mg/dL (ref 8–27)
CALCIUM: 9.4 mg/dL (ref 8.7–10.3)
CO2: 24 mmol/L (ref 20–29)
CREATININE: 0.86 mg/dL (ref 0.57–1.00)
Chloride: 105 mmol/L (ref 96–106)
GFR calc Af Amer: 73 mL/min/{1.73_m2} (ref >59)
GFR calc non Af Amer: 64 mL/min/{1.73_m2} (ref >59)
Glucose: 76 mg/dL (ref 65–99)
Potassium: 4.1 mmol/L (ref 3.5–5.2)
Sodium: 142 mmol/L (ref 134–144)

## 2017-06-19 LAB — CBC
Hematocrit: 42.3 % (ref 34.0–46.6)
Hemoglobin: 14.4 g/dL (ref 11.1–15.9)
MCH: 30.8 pg (ref 26.6–33.0)
MCHC: 34 g/dL (ref 31.5–35.7)
MCV: 90 fL (ref 79–97)
Platelets: 151 10*3/uL (ref 150–379)
RBC: 4.68 x10E6/uL (ref 3.77–5.28)
RDW: 13.5 % (ref 12.3–15.4)
WBC: 2.9 10*3/uL — AB (ref 3.4–10.8)

## 2017-06-19 NOTE — Patient Instructions (Signed)

## 2017-06-20 ENCOUNTER — Encounter: Payer: Self-pay | Admitting: *Deleted

## 2017-07-09 ENCOUNTER — Other Ambulatory Visit: Payer: Self-pay | Admitting: Pharmacist Clinician (PhC)/ Clinical Pharmacy Specialist

## 2017-07-09 MED ORDER — APIXABAN 2.5 MG PO TABS: 2.5000 mg | ORAL_TABLET | Freq: Two times a day (BID) | ORAL | 1 refills | Status: DC

## 2017-08-01 ENCOUNTER — Other Ambulatory Visit: Payer: Self-pay | Admitting: Internal Medicine

## 2017-09-19 DIAGNOSIS — H401113 Primary open-angle glaucoma, right eye, severe stage: Secondary | ICD-10-CM | POA: Diagnosis not present

## 2017-09-19 DIAGNOSIS — H401122 Primary open-angle glaucoma, left eye, moderate stage: Secondary | ICD-10-CM | POA: Diagnosis not present

## 2017-09-24 NOTE — Progress Notes (Signed)
Subjective:    Patient ID: [name], female    DOB: 05/16/1934, 84 y.o.   MRN: [id number]  HPI The patient is here for follow up.  Afib, Hypertension: She is taking her medication daily. She is compliant with a low sodium diet.  She denies chest pain, edema, shortness of breath and regular headaches.  She does experience occasional palpitations with her atrial fibrillation.  There has been no increase.  She is not exercising regularly.  She does not monitor her blood pressure at home.    Hyperlipidemia: She is taking her medication daily. She is compliant with a low fat/cholesterol diet. She is not exercising regularly.    Hemorrhoids: For years she has had issues with her hemorrhoids.  She has seen gastroenterology in the past several times regarding this.  Every time she eats she has not urged to have a bowel movement-sometimes she will have a bowel movement and other times she will not.  She occasionally sees pink discoloration when she wipes and has been told that that was hemorrhoids.  She denies any blood in her on the stool.  She sometimes experiences abdominal pain or cramping with bowel movements.  She does not feel that her symptoms have changed significantly since she saw GI last he does not feel that she needs to see them again.  She has dealt with the symptoms and GI did not feel that surgery was necessary in the past.  Knee arthritis, hand arthritis: She has significant arthritis.  Her knees do affect her ability to ambulate and gait.  She walks with a cane besides walking around her house she is fairly sedentary.  She does not take Tylenol-it tends to upset her stomach.  She has used some topical arthritis medication and they help minimally.  She has had knee injections but it only helps transiently.  She does not want to have knee surgery.  Medications and allergies reviewed with patient and updated if appropriate.  Patient Active Problem List   Diagnosis Date Noted  .  Arthritis of sternoclavicular joint 06/10/2017  . Cervical radiculopathy 06/10/2017  . Thyroid nodule 03/25/2017  . Numbness in both hands 09/24/2016  . Poor balance 09/24/2016  . Irritable bowel syndrome (IBS) 02/24/2015  . Long term current use of anticoagulant therapy 02/07/2012  . Atrial fibrillation ([place]) 02/01/2012  . S/P dilatation of esophageal stricture 12/25/2010  . Osteopenia of the elderly 06/23/2010  . Vitamin D deficiency 06/20/2009  . RENAL CYST 12/20/2008  . Depression 06/22/2008  . CIGARETTE SMOKER 12/22/2007  . RESTLESS LEGS SYNDROME 12/22/2007  . Glaucoma 12/22/2007  . DIVERTICULOSIS OF COLON 12/22/2007  . HYPERCHOLESTEROLEMIA 11/25/2007  . Anxiety 11/25/2007  . Essential hypertension 11/25/2007  . Osteoarthritis 11/25/2007    Current Outpatient Medications on File Prior to Visit  Medication Sig Dispense Refill  . apixaban (ELIQUIS) 2.5 MG TABS tablet Take 1 tablet (2.5 mg total) 2 (two) times daily by mouth. 180 tablet 1  . atorvastatin (LIPITOR) 20 MG tablet take 1 tablet by mouth AT 6 PM 30 tablet 11  . calcium-vitamin D (OSCAL WITH D) 500-200 MG-UNIT tablet Take 1 tablet by mouth 2 (two) times daily.    . Cholecalciferol (VITAMIN D) 2000 UNITS CAPS Take 1 capsule by mouth daily as needed. Reported on 08/17/2015    . dorzolamide-timolol (COSOPT) 22.3-6.8 MG/ML ophthalmic solution Place 1 drop into both eyes 2 (two) times daily.    Marland Kitchen latanoprost (XALATAN) 0.005 % ophthalmic solution Place 1  drop into both eyes at bedtime.     Marland Kitchen losartan-hydrochlorothiazide (HYZAAR) 50-12.5 MG tablet take 1 tablet by mouth once daily 30 tablet 11  . metoprolol tartrate (LOPRESSOR) 50 MG tablet take 1 tablet by mouth twice a day 180 tablet 3  . pilocarpine (PILOCAR) 1 % ophthalmic solution Place 1 drop into the left eye 2 (two) times daily.    . potassium chloride (K-DUR,KLOR-CON) 10 MEQ tablet take 1 tablet by mouth once daily 90 tablet 1  . [DISCONTINUED] potassium chloride  (K-DUR) 10 MEQ tablet Take 1 tablet (10 mEq total) by mouth daily. 30 tablet 2   No current facility-administered medications on file prior to visit.     Past Medical History:  Diagnosis Date  . Anxiety   . Atrial fibrillation ([place])   . Cigarette smoker   . Depression   . Diverticulosis of colon   . DJD (degenerative joint disease)   . Family history of colon cancer   . Glaucoma   . Hemorrhoids   . History of sebaceous cyst    R posterior scalp - observation elected 01/2015 after surgical eval  . Hyperlipidemia   . Hypertension   . Osteopenia   . Renal cyst   . Restless leg syndrome   . Vitamin D deficiency     Past Surgical History:  Procedure Laterality Date  . CARDIOVERSION  04/23/2012   Procedure: CARDIOVERSION;  Surgeon: Hillary Bow, MD;  Location: [facility] ENDOSCOPY;  Service: Cardiovascular;  Laterality: N/A;  . CATARACT EXTRACTION    . COLONOSCOPY    . retina surgery x 2  2013  . TOTAL HIP ARTHROPLASTY  2/05   right by Dr. Tonita Cong  . UPPER GASTROINTESTINAL ENDOSCOPY      Social History   Socioeconomic History  . Marital status: Widowed    Spouse name: None  . Number of children: 0  . Years of education: None  . Highest education level: None  Social Needs  . Financial resource strain: None  . Food insecurity - worry: None  . Food insecurity - inability: None  . Transportation needs - medical: None  . Transportation needs - non-medical: None  Occupational History  . Occupation: Retired  Tobacco Use  . Smoking status: Light Tobacco Smoker    Packs/day: 0.30  . Smokeless tobacco: Never Used  . Tobacco comment: 6 cig a day  Substance and Sexual Activity  . Alcohol use: No    Alcohol/week: 0.0 oz  . Drug use: No  . Sexual activity: Not Currently  Other Topics Concern  . None  Social History Narrative   Exercises 2 times per week   Caffeine use: 1 cup coffee per day   Lives alone, widowed 1978    Family History  Problem Relation Age of Onset  .  Hypertension Mother   . Diabetes Mother   . Colon cancer Brother   . Prostate cancer Father   . Breast cancer Other        niece  . Diabetes Brother   . Diabetes Sister     Review of Systems  Constitutional: Negative for chills and fever.  Respiratory: Negative for cough, shortness of breath (occasional with exertion or bending down) and wheezing.   Cardiovascular: Positive for palpitations (occ). Negative for chest pain and leg swelling.  Gastrointestinal: Positive for abdominal pain (occ with BM) and anal bleeding (intermittent - hemorrhoids, chronic).  Neurological: Negative for dizziness and headaches.       Objective:  Vitals:   09/25/17 1124  BP: 126/88  Pulse: 82  Resp: 16  Temp: 98.1 F (36.7 C)  SpO2: 98%   Wt Readings from Last 3 Encounters:  09/25/17 131 lb (59.4 kg)  06/19/17 129 lb (58.5 kg)  06/10/17 129 lb (58.5 kg)   Body mass index is 21.14 kg/m.   Physical Exam    Constitutional: Appears well-developed and well-nourished. No distress.  HENT:  Head: Normocephalic and atraumatic.  Neck: Neck supple. No tracheal deviation present. No thyromegaly present.  No cervical lymphadenopathy Cardiovascular: Normal rate, irregular rhythm and normal heart sounds.   No murmur heard. No carotid bruit .  No edema Pulmonary/Chest: Effort normal and breath sounds normal. No respiratory distress. No has no wheezes. No rales.  Skin: Skin is warm and dry. Not diaphoretic.  Psychiatric: Normal mood and affect. Behavior is normal.      Assessment & Plan:    See Problem List for Assessment and Plan of chronic medical problems.

## 2017-09-25 ENCOUNTER — Ambulatory Visit: Payer: Medicare Other

## 2017-09-25 ENCOUNTER — Encounter: Payer: Self-pay | Admitting: Internal Medicine

## 2017-09-25 ENCOUNTER — Ambulatory Visit (INDEPENDENT_AMBULATORY_CARE_PROVIDER_SITE_OTHER): Payer: Medicare Other | Admitting: Internal Medicine

## 2017-09-25 VITALS — BP 126/88 | HR 82 | Temp 98.1°F | Resp 16 | Wt 131.0 lb

## 2017-09-25 DIAGNOSIS — M1711 Unilateral primary osteoarthritis, right knee: Secondary | ICD-10-CM | POA: Diagnosis not present

## 2017-09-25 DIAGNOSIS — I4821 Permanent atrial fibrillation: Secondary | ICD-10-CM

## 2017-09-25 DIAGNOSIS — E7800 Pure hypercholesterolemia, unspecified: Secondary | ICD-10-CM

## 2017-09-25 DIAGNOSIS — I10 Essential (primary) hypertension: Secondary | ICD-10-CM | POA: Diagnosis not present

## 2017-09-25 DIAGNOSIS — I482 Chronic atrial fibrillation: Secondary | ICD-10-CM

## 2017-09-25 DIAGNOSIS — K649 Unspecified hemorrhoids: Secondary | ICD-10-CM

## 2017-09-25 NOTE — Assessment & Plan Note (Signed)
In atrial fibrillation, rate controlled Has occasional palpitations, otherwise asymptomatic Following with cardiology Taking Eliquis CBC done 3 months ago, normal Continue current medications Follow-up in 6 months

## 2017-09-25 NOTE — Patient Instructions (Addendum)
  No immunizations administered today.   Medications reviewed and updated.  No changes recommended at this time.    Please followup in 6 months   

## 2017-09-25 NOTE — Assessment & Plan Note (Signed)
Blood pressure well controlled Tolerating medication Continue current dose Blood work done 3 months ago so we will hold off on additional blood work now Follow-up in 6 months

## 2017-09-25 NOTE — Assessment & Plan Note (Addendum)
Has had knee injections - minimal improvement Does not want surgery Has tried topical arthritis treatments - minimal help Does not tolerate Tylenol well Continue topical medications She will let me know if she wants to see orthopedics again

## 2017-09-25 NOTE — Assessment & Plan Note (Signed)
Has hemorrhoids and has experienced intermittent pink coloration when wiping.  Has urge to have a bowel movement with eating-sometimes has a bowel movement and sometimes does not Occasional abdominal cramping with bowel movements Has seen GI in the past-symptoms thought to be related to hemorrhoids Symptoms tolerable and unchanged-chronic She will let me know if she wants to see GI again or if her symptoms worsen

## 2017-09-25 NOTE — Assessment & Plan Note (Signed)
Continue atorvastatin 20 mg daily We will recheck lipid panel at her next visit

## 2017-11-27 ENCOUNTER — Telehealth: Payer: Self-pay | Admitting: Emergency Medicine

## 2017-11-27 NOTE — Telephone Encounter (Signed)
Copied from [place]. Topic: General - Other >> Nov 27, 2017  3:32 PM Carolyn Stare wrote:   [facility] [address] [facility]to say the below med is on backorder and is asking if the med need to be changed to something else    losartan-hydrochlorothiazide (HYZAAR) 50-12.5 MG tablet

## 2017-11-27 NOTE — Telephone Encounter (Signed)
Spoke with pt to advise we would rather send to another pharmacy or split the medication rather than changing to new med. She is going to call [facility] and see if they have the RX available. States she is old and does not want to have to take 2 tablets to equal the current med she is taking.

## 2017-11-27 NOTE — Telephone Encounter (Signed)
Patient is calling back and states [facility] has it. Please advise   [facility] [place] [address]  [place] [address]  Phone: [phone] Fax: [phone]

## 2017-11-28 NOTE — Telephone Encounter (Signed)
Spoke with pt, states she already picked up RX

## 2017-12-05 ENCOUNTER — Telehealth: Payer: Self-pay | Admitting: Emergency Medicine

## 2017-12-05 NOTE — Telephone Encounter (Signed)
Called patient to schedule AWV. Patient stated she would call back to set up at later date.

## 2017-12-23 ENCOUNTER — Other Ambulatory Visit: Payer: Self-pay | Admitting: Emergency Medicine

## 2017-12-23 MED ORDER — LOSARTAN POTASSIUM-HCTZ 50-12.5 MG PO TABS: 1.0000 | ORAL_TABLET | Freq: Every day | ORAL | 1 refills | Status: DC

## 2017-12-23 NOTE — Addendum Note (Signed)
Addended by: Terence Lux B on: 12/23/2017 01:08 PM   Modules accepted: Orders

## 2017-12-31 ENCOUNTER — Other Ambulatory Visit: Payer: Self-pay | Admitting: *Deleted

## 2017-12-31 MED ORDER — APIXABAN 2.5 MG PO TABS: 2.5000 mg | ORAL_TABLET | Freq: Two times a day (BID) | ORAL | 1 refills | Status: DC

## 2018-01-08 ENCOUNTER — Telehealth: Payer: Self-pay | Admitting: Internal Medicine

## 2018-01-08 NOTE — Telephone Encounter (Signed)
Form has been sent to scan &  Spoke with [facility] and informed her it is ready to be picked up.

## 2018-01-08 NOTE — Telephone Encounter (Signed)
signed

## 2018-01-08 NOTE — Telephone Encounter (Signed)
Forms have been filled out and placed in providers box to sign.

## 2018-01-08 NOTE — Telephone Encounter (Signed)
[name] [phone]) dropped off an Application for Renewal of Disability Parking Placard to be completed by Dr Quay Burow. Please call Sueree or the patient when it is ready to be picked up. Placed in Brittany's box for completion.

## 2018-03-20 DIAGNOSIS — H401122 Primary open-angle glaucoma, left eye, moderate stage: Secondary | ICD-10-CM | POA: Diagnosis not present

## 2018-03-20 DIAGNOSIS — H401113 Primary open-angle glaucoma, right eye, severe stage: Secondary | ICD-10-CM | POA: Diagnosis not present

## 2018-03-23 NOTE — Progress Notes (Signed)
Subjective:    Patient ID: [name], female    DOB: [birth year], 84 y.o.   MRN: [id number]  HPI The patient is here for follow up.  Hyperlipidemia: She is taking her medication daily. She is compliant with a low fat/cholesterol diet. She is not exercising regularly. She denies myalgias.   Afib, Hypertension: She is taking her medication daily. She is compliant with a low sodium diet.  She denies chest pain, edema, shortness of breath and regular headaches. She is not exercising regularly.  She does not monitor her blood pressure at home.    She has rectal pain and sometimes difficulty getting stool out.   It is intermittent.  She has normal bowel movements and denies hard stool.  She does not feel like she is constipated.  She was always told this was hemorrhoids, but they were not bad enough to have surgery.  It was found that she had a small rectal prolapse as well, which she was not aware of.  She denies abdominal pain.  She does not feel her symptoms are severe enough to need to see anyone at this time.  Poor balance, legs weak:  She has lower back pain in the morning when she first gets out of bed  - she denies pain during the day.  She has numbness/ achiness in her legs.  Her arms, hands and fingers also ache. She uses a cane.  She feels her balance is getting worse and that concerns her.  In the past she has declined neurology referral and physical therapy.  She does not feel the exercises help.  She denies neck pain.  Medications and allergies reviewed with patient and updated if appropriate.  Patient Active Problem List   Diagnosis Date Noted  . Hemorrhoids 09/25/2017  . Arthritis of sternoclavicular joint 06/10/2017  . Cervical radiculopathy 06/10/2017  . Thyroid nodule 03/25/2017  . Numbness in both hands 09/24/2016  . Poor balance 09/24/2016  . Irritable bowel syndrome (IBS) 02/24/2015  . Long term current use of anticoagulant therapy 02/07/2012  . Atrial  fibrillation ([place]) 02/01/2012  . S/P dilatation of esophageal stricture 12/25/2010  . Osteopenia of the elderly 06/23/2010  . Vitamin D deficiency 06/20/2009  . RENAL CYST 12/20/2008  . CIGARETTE SMOKER 12/22/2007  . RESTLESS LEGS SYNDROME 12/22/2007  . Glaucoma 12/22/2007  . DIVERTICULOSIS OF COLON 12/22/2007  . HYPERCHOLESTEROLEMIA 11/25/2007  . Essential hypertension 11/25/2007  . Osteoarthritis 11/25/2007    Current Outpatient Medications on File Prior to Visit  Medication Sig Dispense Refill  . apixaban (ELIQUIS) 2.5 MG TABS tablet Take 1 tablet (2.5 mg total) by mouth 2 (two) times daily. 180 tablet 1  . atorvastatin (LIPITOR) 20 MG tablet take 1 tablet by mouth AT 6 PM 30 tablet 11  . brimonidine (ALPHAGAN) 0.2 % ophthalmic solution PLACE 1 DROP INTO THE LEFT EYE 2 TIMES DAILY.  11  . calcium-vitamin D (OSCAL WITH D) 500-200 MG-UNIT tablet Take 1 tablet by mouth 2 (two) times daily.    . Cholecalciferol (VITAMIN D) 2000 UNITS CAPS Take 1 capsule by mouth daily as needed. Reported on 08/17/2015    . dorzolamide-timolol (COSOPT) 22.3-6.8 MG/ML ophthalmic solution Place 1 drop into both eyes 2 (two) times daily.    Marland Kitchen latanoprost (XALATAN) 0.005 % ophthalmic solution Place 1 drop into both eyes at bedtime.     Marland Kitchen losartan-hydrochlorothiazide (HYZAAR) 50-12.5 MG tablet Take 1 tablet by mouth daily. 90 tablet 1  . metoprolol tartrate (LOPRESSOR)  50 MG tablet take 1 tablet by mouth twice a day 180 tablet 3  . pilocarpine (PILOCAR) 1 % ophthalmic solution Place 1 drop into the left eye 2 (two) times daily.    . potassium chloride (K-DUR,KLOR-CON) 10 MEQ tablet take 1 tablet by mouth once daily 90 tablet 1  . [DISCONTINUED] potassium chloride (K-DUR) 10 MEQ tablet Take 1 tablet (10 mEq total) by mouth daily. 30 tablet 2   No current facility-administered medications on file prior to visit.     Past Medical History:  Diagnosis Date  . Anxiety   . Atrial fibrillation ([place])   .  Cigarette smoker   . Depression   . Diverticulosis of colon   . DJD (degenerative joint disease)   . Family history of colon cancer   . Glaucoma   . Hemorrhoids   . History of sebaceous cyst    R posterior scalp - observation elected 01/2015 after surgical eval  . Hyperlipidemia   . Hypertension   . Osteopenia   . Renal cyst   . Restless leg syndrome   . Vitamin D deficiency     Past Surgical History:  Procedure Laterality Date  . CARDIOVERSION  04/23/2012   Procedure: CARDIOVERSION;  Surgeon: Hillary Bow, MD;  Location: [facility] ENDOSCOPY;  Service: Cardiovascular;  Laterality: N/A;  . CATARACT EXTRACTION    . COLONOSCOPY    . retina surgery x 2  2013  . TOTAL HIP ARTHROPLASTY  2/05   right by Dr. Tonita Cong  . UPPER GASTROINTESTINAL ENDOSCOPY      Social History   Socioeconomic History  . Marital status: Widowed    Spouse name: Not on file  . Number of children: 0  . Years of education: Not on file  . Highest education level: Not on file  Occupational History  . Occupation: Retired  Scientific laboratory technician  . Financial resource strain: Not on file  . Food insecurity:    Worry: Not on file    Inability: Not on file  . Transportation needs:    Medical: Not on file    Non-medical: Not on file  Tobacco Use  . Smoking status: Light Tobacco Smoker    Packs/day: 0.30  . Smokeless tobacco: Never Used  . Tobacco comment: 6 cig a day  Substance and Sexual Activity  . Alcohol use: No    Alcohol/week: 0.0 oz  . Drug use: No  . Sexual activity: Not Currently  Lifestyle  . Physical activity:    Days per week: Not on file    Minutes per session: Not on file  . Stress: Not on file  Relationships  . Social connections:    Talks on phone: Not on file    Gets together: Not on file    Attends religious service: Not on file    Active member of club or organization: Not on file    Attends meetings of clubs or organizations: Not on file    Relationship status: Not on file  Other Topics  Concern  . Not on file  Social History Narrative   Exercises 2 times per week   Caffeine use: 1 cup coffee per day   Lives alone, widowed 1978    Family History  Problem Relation Age of Onset  . Hypertension Mother   . Diabetes Mother   . Colon cancer Brother   . Prostate cancer Father   . Breast cancer Other        niece  . Diabetes Brother   .  Diabetes Sister     Review of Systems  Constitutional: Negative for chills and fever.  Respiratory: Negative for cough, shortness of breath and wheezing.   Cardiovascular: Positive for palpitations (sometimes). Negative for chest pain and leg swelling.  Musculoskeletal: Positive for back pain (lower back in morning only). Negative for neck pain.  Neurological: Positive for weakness (in legs all the time, mild weakness in hands) and numbness (in arms and legs - intermittent). Negative for light-headedness and headaches.       Objective:   Vitals:   03/25/18 1111  BP: 134/88  Pulse: 96  SpO2: 97%   BP Readings from Last 3 Encounters:  03/25/18 134/88  09/25/17 126/88  06/19/17 120/78   Wt Readings from Last 3 Encounters:  03/25/18 130 lb (59 kg)  09/25/17 131 lb (59.4 kg)  06/19/17 129 lb (58.5 kg)   Body mass index is 20.98 kg/m.   Physical Exam    Constitutional: Appears well-developed and well-nourished. No distress.  HENT:  Head: Normocephalic and atraumatic.  Neck: Neck supple. No tracheal deviation present. No thyromegaly present.  No cervical lymphadenopathy Cardiovascular: Normal rate, slightly irregular rhythm and normal heart sounds.   No murmur heard. No carotid bruit .  No edema Pulmonary/Chest: Effort normal and breath sounds normal. No respiratory distress. No has no wheezes. No rales.  Neurological: Normal sensation with light touch all extremities Skin: Skin is warm and dry. Not diaphoretic.  Psychiatric: Normal mood and affect. Behavior is normal.      Assessment & Plan:    See Problem List  for Assessment and Plan of chronic medical problems.

## 2018-03-25 ENCOUNTER — Ambulatory Visit (INDEPENDENT_AMBULATORY_CARE_PROVIDER_SITE_OTHER): Payer: Medicare Other | Admitting: Internal Medicine

## 2018-03-25 ENCOUNTER — Encounter: Payer: Self-pay | Admitting: Internal Medicine

## 2018-03-25 VITALS — BP 134/88 | HR 96 | Ht 66.0 in | Wt 130.0 lb

## 2018-03-25 DIAGNOSIS — I10 Essential (primary) hypertension: Secondary | ICD-10-CM | POA: Diagnosis not present

## 2018-03-25 DIAGNOSIS — I482 Chronic atrial fibrillation: Secondary | ICD-10-CM

## 2018-03-25 DIAGNOSIS — K623 Rectal prolapse: Secondary | ICD-10-CM | POA: Insufficient documentation

## 2018-03-25 DIAGNOSIS — R200 Anesthesia of skin: Secondary | ICD-10-CM | POA: Insufficient documentation

## 2018-03-25 DIAGNOSIS — R202 Paresthesia of skin: Secondary | ICD-10-CM

## 2018-03-25 DIAGNOSIS — E7800 Pure hypercholesterolemia, unspecified: Secondary | ICD-10-CM | POA: Diagnosis not present

## 2018-03-25 DIAGNOSIS — R29898 Other symptoms and signs involving the musculoskeletal system: Secondary | ICD-10-CM | POA: Insufficient documentation

## 2018-03-25 DIAGNOSIS — I4821 Permanent atrial fibrillation: Secondary | ICD-10-CM

## 2018-03-25 NOTE — Assessment & Plan Note (Signed)
Occasional palpitations Pulse on higher side, but less than 100 Continue metoprolol Following with cardiology Taking Eliquis CBC, CMP

## 2018-03-25 NOTE — Assessment & Plan Note (Signed)
Expensing numbness/tingling in hands and feet Check B12 level Also experiencing leg weakness and achiness Balance is getting worse Sedentary Deferred physical therapy Will refer to neurology for further evaluation

## 2018-03-25 NOTE — Patient Instructions (Addendum)
  Test(s) ordered today. Your results will be released to [place] (or called to you) after review, usually within 72hours after test completion. If any changes need to be made, you will be notified at that same time.   Medications reviewed and updated.   No changes recommended at this time.   A referral was ordered for neurology  Please followup in 6 months

## 2018-03-25 NOTE — Assessment & Plan Note (Signed)
Continue atorvastatin Check lipid panel, CMP 

## 2018-03-25 NOTE — Assessment & Plan Note (Signed)
Blood pressure adequately controlled Continue current medications at current doses CMP

## 2018-03-25 NOTE — Assessment & Plan Note (Signed)
Experiencing worsening leg weakness associated with poor balance and numbness/tingling in hands and feet/legs Check B12 level Balance is getting worse Sedentary Deferred physical therapy Will refer to neurology for further evaluation

## 2018-03-26 ENCOUNTER — Other Ambulatory Visit: Payer: Self-pay

## 2018-03-26 ENCOUNTER — Other Ambulatory Visit (INDEPENDENT_AMBULATORY_CARE_PROVIDER_SITE_OTHER): Payer: Medicare Other

## 2018-03-26 DIAGNOSIS — I10 Essential (primary) hypertension: Secondary | ICD-10-CM

## 2018-03-26 DIAGNOSIS — R202 Paresthesia of skin: Secondary | ICD-10-CM | POA: Diagnosis not present

## 2018-03-26 DIAGNOSIS — E7800 Pure hypercholesterolemia, unspecified: Secondary | ICD-10-CM | POA: Diagnosis not present

## 2018-03-26 DIAGNOSIS — I482 Chronic atrial fibrillation: Secondary | ICD-10-CM

## 2018-03-26 DIAGNOSIS — R200 Anesthesia of skin: Secondary | ICD-10-CM

## 2018-03-26 DIAGNOSIS — I4821 Permanent atrial fibrillation: Secondary | ICD-10-CM

## 2018-03-26 LAB — COMPREHENSIVE METABOLIC PANEL
ALK PHOS: 65 U/L (ref 39–117)
ALT: 15 U/L (ref 0–35)
AST: 16 U/L (ref 0–37)
Albumin: 3.9 g/dL (ref 3.5–5.2)
BILIRUBIN TOTAL: 0.9 mg/dL (ref 0.2–1.2)
BUN: 13 mg/dL (ref 6–23)
CALCIUM: 9.3 mg/dL (ref 8.4–10.5)
CO2: 30 mEq/L (ref 19–32)
Chloride: 108 mEq/L (ref 96–112)
Creatinine, Ser: 0.93 mg/dL (ref 0.40–1.20)
GFR: 74.16 mL/min (ref >60.00)
GLUCOSE: 69 mg/dL — AB (ref 70–99)
Potassium: 3.6 mEq/L (ref 3.5–5.1)
Sodium: 142 mEq/L (ref 135–145)
TOTAL PROTEIN: 6.4 g/dL (ref 6.0–8.3)

## 2018-03-26 LAB — LIPID PANEL
Cholesterol: 140 mg/dL (ref 0–200)
HDL: 62.5 mg/dL (ref >39.00)
LDL Cholesterol: 64 mg/dL (ref 0–99)
NonHDL: 77.12
Total CHOL/HDL Ratio: 2
Triglycerides: 68 mg/dL (ref 0.0–149.0)
VLDL: 13.6 mg/dL (ref 0.0–40.0)

## 2018-03-26 LAB — CBC WITH DIFFERENTIAL/PLATELET
Basophils Absolute: 0 K/uL (ref 0.0–0.1)
Basophils Relative: 0.9 % (ref 0.0–3.0)
Eosinophils Absolute: 0.1 K/uL (ref 0.0–0.7)
Eosinophils Relative: 2.9 % (ref 0.0–5.0)
HCT: 42.4 % (ref 36.0–46.0)
Hemoglobin: 14.1 g/dL (ref 12.0–15.0)
Lymphocytes Relative: 30.7 % (ref 12.0–46.0)
Lymphs Abs: 1.3 K/uL (ref 0.7–4.0)
MCHC: 33.3 g/dL (ref 30.0–36.0)
MCV: 92.9 fl (ref 78.0–100.0)
Monocytes Absolute: 0.5 K/uL (ref 0.1–1.0)
Monocytes Relative: 11.7 % (ref 3.0–12.0)
Neutro Abs: 2.3 K/uL (ref 1.4–7.7)
Neutrophils Relative %: 53.8 % (ref 43.0–77.0)
Platelets: 141 K/uL — ABNORMAL LOW (ref 150.0–400.0)
RBC: 4.56 Mil/uL (ref 3.87–5.11)
RDW: 13.6 % (ref 11.5–15.5)
WBC: 4.3 K/uL (ref 4.0–10.5)

## 2018-03-26 LAB — VITAMIN B12: VITAMIN B 12: 500 pg/mL (ref 211–911)

## 2018-04-05 ENCOUNTER — Other Ambulatory Visit: Payer: Self-pay | Admitting: Endocrinology

## 2018-04-05 DIAGNOSIS — E041 Nontoxic single thyroid nodule: Secondary | ICD-10-CM

## 2018-04-24 ENCOUNTER — Other Ambulatory Visit: Payer: Self-pay | Admitting: Internal Medicine

## 2018-05-12 DIAGNOSIS — Z23 Encounter for immunization: Secondary | ICD-10-CM | POA: Diagnosis not present

## 2018-05-12 DIAGNOSIS — E041 Nontoxic single thyroid nodule: Secondary | ICD-10-CM | POA: Diagnosis not present

## 2018-05-23 ENCOUNTER — Ambulatory Visit
Admission: RE | Admit: 2018-05-23 | Discharge: 2018-05-23 | Disposition: A | Discharge status: Home / Self Care | Payer: Medicare Other | Source: Ambulatory Visit | Attending: Endocrinology | Admitting: Endocrinology

## 2018-05-23 DIAGNOSIS — E041 Nontoxic single thyroid nodule: Secondary | ICD-10-CM | POA: Diagnosis not present

## 2018-05-26 DIAGNOSIS — H401122 Primary open-angle glaucoma, left eye, moderate stage: Secondary | ICD-10-CM | POA: Diagnosis not present

## 2018-05-26 DIAGNOSIS — H401113 Primary open-angle glaucoma, right eye, severe stage: Secondary | ICD-10-CM | POA: Diagnosis not present

## 2018-06-03 ENCOUNTER — Encounter

## 2018-06-03 ENCOUNTER — Ambulatory Visit (INDEPENDENT_AMBULATORY_CARE_PROVIDER_SITE_OTHER): Payer: Medicare Other | Admitting: Neurology

## 2018-06-03 ENCOUNTER — Telehealth: Payer: Self-pay | Admitting: Neurology

## 2018-06-03 ENCOUNTER — Encounter: Payer: Self-pay | Admitting: Neurology

## 2018-06-03 VITALS — BP 118/71 | HR 88 | Ht 66.0 in | Wt 130.0 lb

## 2018-06-03 DIAGNOSIS — R202 Paresthesia of skin: Secondary | ICD-10-CM | POA: Diagnosis not present

## 2018-06-03 DIAGNOSIS — M545 Low back pain, unspecified: Secondary | ICD-10-CM

## 2018-06-03 DIAGNOSIS — R269 Unspecified abnormalities of gait and mobility: Secondary | ICD-10-CM | POA: Insufficient documentation

## 2018-06-03 MED ORDER — GABAPENTIN 300 MG PO CAPS: 300.0000 mg | ORAL_CAPSULE | Freq: Three times a day (TID) | ORAL | 11 refills | Status: DC

## 2018-06-03 NOTE — Telephone Encounter (Signed)
Medicare/[employer] Josem Kaufmann; [employer] via [facility] website order sent to [facility]

## 2018-06-03 NOTE — Progress Notes (Signed)
PATIENT: [name] DOB: 1934/07/29  Chief Complaint  Patient presents with  . New Patient (Initial Visit)    PCP: Dr. Billey Gosling     HISTORICAL  [name] is 84 years old female, seen in request by her primary care physician Dr. Quay Burow, Mar[place]etta Board for evaluation of achiness in her leg, initial evaluation was on June 03, 2017.  I have reviewed and summarized the referring note from the referring physician.  She had a past medical history of hyperlipidemia, hypertension, atrial fibrillation, on chronic Eliquis treatment.  Her neighbor drove her to clinic today, but she is alone at today's interview, she reported history of chronic right hip pain, had a right hip replacement many years ago, but has been ambulate with a cane, she lives by herself, able to keep up with her housework, she complains few months history of worsening right hip discomfort, low back pain, intermittent bilateral lower extremity pain, radiating pain and paresthesia to right lower extremity.  She also complains of intermittent bilateral hands paresthesia, woke up from sleep felt stiffness of bilateral hands, she denies bowel and bladder incontinence, she denies significant neck pain,  REVIEW OF SYSTEMS: Full 14 system review of systems performed and notable only for weight gain, weight loss, fatigue, hearing loss, blurred vision, cramps, aching muscles, headaches, numbness, weakness, dizziness, restless leg, anxiety, too much sleep, decreased energy, change in appetite, racing thoughts. All other review of systems were negative.  ALLERGIES: No Known Allergies  HOME MEDICATIONS: Current Outpatient Medications  Medication Sig Dispense Refill  . apixaban (ELIQUIS) 2.5 MG TABS tablet Take 1 tablet (2.5 mg total) by mouth 2 (two) times daily. 180 tablet 1  . atorvastatin (LIPITOR) 20 MG tablet TAKE 1 TABLET BY MOUTH DAILY AT 6PM 90 tablet 1  . brimonidine (ALPHAGAN) 0.2 % ophthalmic solution PLACE 1 DROP  INTO THE LEFT EYE 2 TIMES DAILY.  11  . dorzolamide-timolol (COSOPT) 22.3-6.8 MG/ML ophthalmic solution Place 1 drop into both eyes 2 (two) times daily.    Marland Kitchen KLOR-CON M10 10 MEQ tablet TAKE ONE TABLET BY MOUTH EVERY DAY 90 tablet 0  . latanoprost (XALATAN) 0.005 % ophthalmic solution Place 1 drop into both eyes at bedtime.     Marland Kitchen losartan-hydrochlorothiazide (HYZAAR) 50-12.5 MG tablet Take 1 tablet by mouth daily. 90 tablet 1  . metoprolol tartrate (LOPRESSOR) 100 MG tablet Take 100 mg by mouth 2 (two) times daily.    . pilocarpine (PILOCAR) 1 % ophthalmic solution Place 1 drop into the left eye 2 (two) times daily.     No current facility-administered medications for this visit.     PAST MEDICAL HISTORY: Past Medical History:  Diagnosis Date  . Anxiety   . Atrial fibrillation ([place])   . Cigarette smoker   . Depression   . Diverticulosis of colon   . DJD (degenerative joint disease)   . Family history of colon cancer   . Glaucoma   . Hemorrhoids   . History of sebaceous cyst    R posterior scalp - observation elected 01/2015 after surgical eval  . Hyperlipidemia   . Hypertension   . Osteopenia   . Renal cyst   . Restless leg syndrome   . Vitamin D deficiency     PAST SURGICAL HISTORY: Past Surgical History:  Procedure Laterality Date  . CARDIOVERSION  04/23/2012   Procedure: CARDIOVERSION;  Surgeon: Hillary Bow, MD;  Location: [facility] ENDOSCOPY;  Service: Cardiovascular;  Laterality: N/A;  . CATARACT  EXTRACTION    . COLONOSCOPY    . retina surgery x 2  2013  . TOTAL HIP ARTHROPLASTY  2/05   right by Dr. Tonita Cong  . UPPER GASTROINTESTINAL ENDOSCOPY      FAMILY HISTORY: Family History  Problem Relation Age of Onset  . Hypertension Mother   . Diabetes Mother   . Colon cancer Brother   . Prostate cancer Father   . Breast cancer Other        niece  . Diabetes Brother   . Diabetes Sister     SOCIAL HISTORY: Social History   Socioeconomic History  . Marital status:  Widowed    Spouse name: Not on file  . Number of children: 0  . Years of education: Not on file  . Highest education level: Not on file  Occupational History  . Occupation: Retired  Scientific laboratory technician  . Financial resource strain: Not on file  . Food insecurity:    Worry: Not on file    Inability: Not on file  . Transportation needs:    Medical: Not on file    Non-medical: Not on file  Tobacco Use  . Smoking status: Light Tobacco Smoker    Packs/day: 0.30  . Smokeless tobacco: Never Used  . Tobacco comment: 6 cig a day  Substance and Sexual Activity  . Alcohol use: No    Alcohol/week: 0.0 standard drinks  . Drug use: No  . Sexual activity: Not Currently  Lifestyle  . Physical activity:    Days per week: Not on file    Minutes per session: Not on file  . Stress: Not on file  Relationships  . Social connections:    Talks on phone: Not on file    Gets together: Not on file    Attends religious service: Not on file    Active member of club or organization: Not on file    Attends meetings of clubs or organizations: Not on file    Relationship status: Not on file  . Intimate partner violence:    Fear of current or ex partner: Not on file    Emotionally abused: Not on file    Physically abused: Not on file    Forced sexual activity: Not on file  Other Topics Concern  . Not on file  Social History Narrative   Exercises 2 times per week   Caffeine use: 1 cup coffee per day   Lives alone, widowed [place]:   06/03/18 0943  BP: 118/71  Pulse: 88  Weight: 130 lb (59 kg)  Height: 5\' 6"  (1.676 m)    Not recorded      Body mass index is 20.98 kg/m.  PHYSICAL EXAMNIATION:  Gen: NAD, conversant, well nourised, obese, well groomed                     Cardiovascular: Regular rate rhythm, no peripheral edema, warm, nontender. Eyes: Conjunctivae clear without exudates or hemorrhage Neck: Supple, no carotid bruits. Pulmonary: Clear to auscultation  bilaterally   NEUROLOGICAL EXAM:  MENTAL STATUS: Speech:    Speech is normal; fluent and spontaneous with normal comprehension.  Cognition:     Orientation to time, place and person     Normal recent and remote memory     Normal Attention span and concentration     Normal Language, naming, repeating,spontaneous speech     Fund of knowledge   CRANIAL NERVES: CN  II: Visual fields are full to confrontation. Fundoscopic exam is normal with sharp discs and no vascular changes. Pupils are round equal and briskly reactive to light. CN III, IV, VI: extraocular movement are normal. No ptosis. CN V: Facial sensation is intact to pinprick in all 3 divisions bilaterally. Corneal responses are intact.  CN VII: Face is symmetric with normal eye closure and smile. CN VIII: Hearing is normal to rubbing fingers CN IX, X: Palate elevates symmetrically. Phonation is normal. CN XI: Head turning and shoulder shrug are intact CN XII: Tongue is midline with normal movements and no atrophy.  MOTOR: There is no pronator drift of out-stretched arms. Muscle bulk and tone are normal. Muscle strength is normal.  REFLEXES: Reflexes are 2+ and symmetric at the biceps, triceps, knees, and ankles. Plantar responses are flexor.  SENSORY: Length dependent decreased light touch, pinprick, vibratory sensation to bilateral knee level.  COORDINATION: Rapid alternating movements and fine finger movements are intact. There is no dysmetria on finger-to-nose and heel-knee-shin.    GAIT/STANCE: She needs pushed up to get up from seated position, antalgic, unsteady rely on her cane   DIAGNOSTIC DATA (LABS, IMAGING, TESTING) - I reviewed patient records, labs, notes, testing and imaging myself where available.   ASSESSMENT AND PLAN  [name] is a 84 y.o. female   Intermittent bilateral lower extremity paresthesia, right worse than left, Chronic low back pain, right hip pain  Differentiation diagnosis  including peripheral neuropathy, right lumbosacral radiculopathy  Proceed with MRI of lumbar  EMG nerve conduction study  Gabapentin 300 mg 3 times a day  Marcial Pacas, M.D. Ph.D.  [facility] [address], [place], [place] [address] Ph: [phone] Fax: [phone]  CC: Binnie Rail, MD

## 2018-06-03 NOTE — Telephone Encounter (Signed)
Left voicemail for patient to be aware of the order going to GI I left their number of [phone] just incase she hasn't heard in the next 2-3 days

## 2018-06-12 ENCOUNTER — Ambulatory Visit
Admission: RE | Admit: 2018-06-12 | Discharge: 2018-06-12 | Disposition: A | Discharge status: Home / Self Care | Payer: Medicare Other | Source: Ambulatory Visit | Attending: Neurology | Admitting: Neurology

## 2018-06-12 DIAGNOSIS — M545 Low back pain, unspecified: Secondary | ICD-10-CM

## 2018-06-12 DIAGNOSIS — R200 Anesthesia of skin: Secondary | ICD-10-CM | POA: Diagnosis not present

## 2018-06-12 DIAGNOSIS — R202 Paresthesia of skin: Secondary | ICD-10-CM

## 2018-06-12 DIAGNOSIS — R269 Unspecified abnormalities of gait and mobility: Secondary | ICD-10-CM

## 2018-06-13 ENCOUNTER — Telehealth: Payer: Self-pay | Admitting: Neurology

## 2018-06-13 NOTE — Telephone Encounter (Signed)
Please call patient, MRI of the lumbar showed multiple level degenerative changes, most significant abnormality at the lower lumbar region, L4-5, L5-S1, evidence of mild spinal canal stenosis, with variable degree of foraminal narrowing, will review MRI films with her at next visit on July 02, 2018   IMPRESSION: This MRI of the lumbar spine shows multilevel degenerative changes as detailed above.  Most significant findings are: 1.  At L1-L2, there is minimal retrolisthesis associated with disc protrusion, mild facet hypertrophy and ligamenta flava hypertrophy.  There is mild bilateral foraminal narrowing and mild left lateral recess but no nerve root compression. 2.   At L3-L4, there are degenerative changes and narrowing the central canal but not enough to be considered spinal stenosis.  There is moderate left foraminal and lateral recess stenosis but no definite nerve root compression. 3.    At L4-L5, there is mild spinal stenosis, moderate right foraminal narrowing and moderately severe left lateral recess stenosis.  There is potential left L5 nerve root compression. 4.    At L5-S1, there is a left paramedian disc herniation and other degenerative changes causing moderately severe left foraminal narrowing and moderate left lateral recess stenosis.  There is potential for left L5 nerve root compression.

## 2018-06-13 NOTE — Telephone Encounter (Addendum)
Spoke to patient - she is aware of her results and will keep her NCV/EMG appt on 07/02/18.

## 2018-06-16 DIAGNOSIS — H401122 Primary open-angle glaucoma, left eye, moderate stage: Secondary | ICD-10-CM | POA: Diagnosis not present

## 2018-06-16 DIAGNOSIS — H401113 Primary open-angle glaucoma, right eye, severe stage: Secondary | ICD-10-CM | POA: Diagnosis not present

## 2018-06-25 ENCOUNTER — Other Ambulatory Visit: Payer: Self-pay | Admitting: Cardiology

## 2018-06-25 ENCOUNTER — Other Ambulatory Visit: Payer: Self-pay | Admitting: Neurology

## 2018-06-25 NOTE — Telephone Encounter (Signed)
Rx request sent to pharmacy.  

## 2018-06-30 ENCOUNTER — Other Ambulatory Visit: Payer: Self-pay | Admitting: Internal Medicine

## 2018-07-02 ENCOUNTER — Ambulatory Visit (INDEPENDENT_AMBULATORY_CARE_PROVIDER_SITE_OTHER): Payer: Medicare Other | Admitting: Neurology

## 2018-07-02 DIAGNOSIS — R202 Paresthesia of skin: Secondary | ICD-10-CM

## 2018-07-02 DIAGNOSIS — M545 Low back pain, unspecified: Secondary | ICD-10-CM

## 2018-07-02 DIAGNOSIS — R269 Unspecified abnormalities of gait and mobility: Secondary | ICD-10-CM | POA: Diagnosis not present

## 2018-07-02 NOTE — Progress Notes (Signed)
PATIENT: [name] DOB: [birth year]  HISTORICAL  [name] is 84 years old female, seen in request by her primary care physician Dr. Quay Burow, [place]etta Board for evaluation of achiness in her leg, initial evaluation was on June 03, 2017.  I have reviewed and summarized the referring note from the referring physician.  She had a past medical history of hyperlipidemia, hypertension, atrial fibrillation, on chronic Eliquis treatment.  Her neighbor drove her to clinic today, Karen she is alone at today's interview, she reported history of chronic right hip pain, had a right hip replacement many years ago, Karen has been ambulate with a cane, she lives by herself, able to keep up with her housework, she complains few months history of worsening right hip discomfort, low back pain, intermittent bilateral lower extremity pain, radiating pain and paresthesia to right lower extremity.  She also complains of intermittent bilateral hands paresthesia, woke up from sleep felt stiffness of bilateral hands, she denies bowel and bladder incontinence, she denies significant neck pain,  UPDATE Nov 6th 2019: She is accompanied by her friend at visit, continue have significant low back pain, right knee pain, radiating pain from right knee to right lateral spine, gait abnormality, rely on her walker,  I have personally reviewed MRI of lumbar on June 12, 2018: multilevel degenerative changes, most significant abnormalities at L4-5, with mild spinal canal stenosis, moderate right foraminal narrowing, moderate severe left lateral recess stenosis potential left L5 nerve root compression, L5-S1, left paramedian disc herniation, and other degenerative changes cause moderate severe left foraminal narrowing, lateral recess stenosis, potential left L5 nerve root compression,  Today's EMG nerve conduction study showed evidence of length dependent axonal sensorimotor polyneuropathy, mild to moderate, in addition, there is  evidence of chronic neuropathic changes involving bilateral L4, V nerve roots, consistent with chronic lumbosacral radiculopathy, there is no evidence of active process.   REVIEW OF SYSTEMS: Full 14 system review of systems performed and notable only for weight gain, weight loss, fatigue, hearing loss, blurred vision, cramps, aching muscles, headaches, numbness, weakness, dizziness, restless leg, anxiety, too much sleep, decreased energy, change in appetite, racing thoughts. All other review of systems were negative.  ALLERGIES: No Known Allergies  HOME MEDICATIONS: Current Outpatient Medications  Medication Sig Dispense Refill  . apixaban (ELIQUIS) 2.5 MG TABS tablet Take 1 tablet (2.5 mg total) by mouth 2 (two) times daily. Please schedule appointment for refills. 180 tablet 0  . atorvastatin (LIPITOR) 20 MG tablet TAKE 1 TABLET BY MOUTH DAILY AT 6PM 90 tablet 1  . brimonidine (ALPHAGAN) 0.2 % ophthalmic solution PLACE 1 DROP INTO THE LEFT EYE 2 TIMES DAILY.  11  . dorzolamide-timolol (COSOPT) 22.3-6.8 MG/ML ophthalmic solution Place 1 drop into both eyes 2 (two) times daily.    Marland Kitchen gabapentin (NEURONTIN) 300 MG capsule TAKE 1 CAPSULE BY MOUTH THREE TIMES A DAY 270 capsule 3  . KLOR-CON M10 10 MEQ tablet TAKE ONE TABLET BY MOUTH EVERY DAY 90 tablet 0  . latanoprost (XALATAN) 0.005 % ophthalmic solution Place 1 drop into both eyes at bedtime.     Marland Kitchen losartan-hydrochlorothiazide (HYZAAR) 50-12.5 MG tablet TAKE 1 TABLET BY MOUTH EVERY DAY 90 tablet 1  . metoprolol tartrate (LOPRESSOR) 100 MG tablet Take 100 mg by mouth 2 (two) times daily.    . pilocarpine (PILOCAR) 1 % ophthalmic solution Place 1 drop into the left eye 2 (two) times daily.     No current facility-administered medications for this visit.  PAST MEDICAL HISTORY: Past Medical History:  Diagnosis Date  . Anxiety   . Atrial fibrillation ([place])   . Cigarette smoker   . Depression   . Diverticulosis of colon   . DJD  (degenerative joint disease)   . Family history of colon cancer   . Glaucoma   . Hemorrhoids   . History of sebaceous cyst    R posterior scalp - observation elected 01/2015 after surgical eval  . Hyperlipidemia   . Hypertension   . Osteopenia   . Renal cyst   . Restless leg syndrome   . Vitamin D deficiency     PAST SURGICAL HISTORY: Past Surgical History:  Procedure Laterality Date  . CARDIOVERSION  04/23/2012   Procedure: CARDIOVERSION;  Surgeon: Hillary Bow, MD;  Location: [facility] ENDOSCOPY;  Service: Cardiovascular;  Laterality: N/A;  . CATARACT EXTRACTION    . COLONOSCOPY    . retina surgery x 2  2013  . TOTAL HIP ARTHROPLASTY  2/05   right by Dr. Tonita Cong  . UPPER GASTROINTESTINAL ENDOSCOPY      FAMILY HISTORY: Family History  Problem Relation Age of Onset  . Hypertension Mother   . Diabetes Mother   . Colon cancer Brother   . Prostate cancer Father   . Breast cancer Other        niece  . Diabetes Brother   . Diabetes Sister     SOCIAL HISTORY: Social History   Socioeconomic History  . Marital status: Widowed    Spouse name: Not on file  . Number of children: 0  . Years of education: Not on file  . Highest education level: Not on file  Occupational History  . Occupation: Retired  Scientific laboratory technician  . Financial resource strain: Not on file  . Food insecurity:    Worry: Not on file    Inability: Not on file  . Transportation needs:    Medical: Not on file    Non-medical: Not on file  Tobacco Use  . Smoking status: Light Tobacco Smoker    Packs/day: 0.30  . Smokeless tobacco: Never Used  . Tobacco comment: 6 cig a day  Substance and Sexual Activity  . Alcohol use: No    Alcohol/week: 0.0 standard drinks  . Drug use: No  . Sexual activity: Not Currently  Lifestyle  . Physical activity:    Days per week: Not on file    Minutes per session: Not on file  . Stress: Not on file  Relationships  . Social connections:    Talks on phone: Not on file     Gets together: Not on file    Attends religious service: Not on file    Active member of club or organization: Not on file    Attends meetings of clubs or organizations: Not on file    Relationship status: Not on file  . Intimate partner violence:    Fear of current or ex partner: Not on file    Emotionally abused: Not on file    Physically abused: Not on file    Forced sexual activity: Not on file  Other Topics Concern  . Not on file  Social History Narrative   Exercises 2 times per week   Caffeine use: 1 cup coffee per day   Lives alone, widowed [place]   There were no vitals filed for this visit.  Not recorded      There is no height or weight  on file to calculate BMI.  PHYSICAL EXAMNIATION:  Gen: NAD, conversant, well nourised, obese, well groomed                     Cardiovascular: Regular rate rhythm, no peripheral edema, warm, nontender. Eyes: Conjunctivae clear without exudates or hemorrhage Neck: Supple, no carotid bruits. Pulmonary: Clear to auscultation bilaterally   NEUROLOGICAL EXAM:  MENTAL STATUS: Speech:    Speech is normal; fluent and spontaneous with normal comprehension.  Cognition:     Orientation to time, place and person     Normal recent and remote memory     Normal Attention span and concentration     Normal Language, naming, repeating,spontaneous speech     Fund of knowledge   CRANIAL NERVES: CN II: Visual fields are full to confrontation.  Pupils are round equal and briskly reactive to light. CN III, IV, VI: extraocular movement are normal. No ptosis. CN V: Facial sensation is intact to pinprick in all 3 divisions bilaterally. Corneal responses are intact.  CN VII: Face is symmetric with normal eye closure and smile. CN VIII: Hearing is normal to rubbing fingers CN IX, X: Palate elevates symmetrically. Phonation is normal. CN XI: Head turning and shoulder shrug are intact CN XII: Tongue is midline with normal movements  and no atrophy.  MOTOR: Examination of bilateral lower extremities limited because of right knee pain, there is mild bilateral toe extension flexion weakness, Karen there is no significant proximal muscle or ankle muscle weakness,  REFLEXES: Reflexes are 2+ and symmetric at the biceps, triceps, absent at knees, and ankles. Plantar responses are flexor.  SENSORY: Length dependent decreased light touch, pinprick, vibratory sensation to bilateral knee level.  COORDINATION: Rapid alternating movements and fine finger movements are intact. There is no dysmetria on finger-to-nose and heel-knee-shin.    GAIT/STANCE: She needs pushed up to get up from seated position, antalgic, unsteady rely on her cane   DIAGNOSTIC DATA (LABS, IMAGING, TESTING) - I reviewed patient records, labs, notes, testing and imaging myself where available.   ASSESSMENT AND PLAN  AULANI SHIPTON is a 84 y.o. female   Gait abnormality  Multifactorial, this including peripheral neuropathy, chronic mild lumbar radiculopathy,  right knee pain  MRI of lumbar showed multilevel degenerative changes, most significant at at L4-5, L5-S1, with evidence of foraminal narrowing, moderate severe on the left side, Karen does not correlate with her significant complaining of right leg, knee pain,  I have referred her to home physical therapy,  Right knee pain plays a major role in her complaints of worsening gait abnormality, she may consider orthopedic evaluation,  Marcial Pacas, M.D. Ph.D.  [facility] [address], [place], [place] [address] Ph: [phone] Fax: [phone]  CC: Binnie Rail, MD

## 2018-07-03 ENCOUNTER — Encounter: Payer: Self-pay | Admitting: Internal Medicine

## 2018-07-03 DIAGNOSIS — G629 Polyneuropathy, unspecified: Secondary | ICD-10-CM | POA: Insufficient documentation

## 2018-07-03 NOTE — Procedures (Signed)
Full Name: [name] Gender: Female MRN #: [id number] Date of Birth: 08/20/1934    Visit Date: 07/02/2018 10:47 Age: 84 Years 78 Months Old Examining Physician: Marcial Pacas, MD  Referring Physician: Krista Blue, MD History: 84 years old female, complains of progressive bilateral feet paresthesia, low back pain, right knee pain, worsening gait abnormality.  Summary of the tests:  Nerve conduction study:  Bilateral sural, superficial peroneal sensory responses were absent. Bilateral peroneal to EDB, and tibial motor responses showed moderate prolonged distal latency, mild to moderate decreased C map amplitude, with mild to moderate decreased conduction velocity.  Bilateral tibial F-wave latency were also moderately prolonged.  Right ulnar sensory and motor responses were normal.  Electromyography: Selective needle examination were performed at bilateral lower extremity muscles, there is evidence of mild chronic neuropathic changes involving bilateral tibialis anterior, peroneal longus, medial gastrocnemius, mainly L5, L4 myotomes.  There is no evidence of active process.  There is no evidence of active denervation in the bilateral lumbosacral paraspinal muscles.   Conclusion:  This is an abnormal study.  There is electrodiagnostic evidence of length dependent axonal sensorimotor polyneuropathy.  There is also evidence of chronic bilateral lumbosacral radiculopathy, involving bilateral L4-5 myotomes.   ------------------------------- Marcial Pacas, M.D. PhD.  [facility] [place], [place] [address] Tel: [phone] Fax: [phone]        [facility]    Nerve / Sites Muscle Latency Ref. Amplitude Ref. Rel Amp Segments Distance Velocity Ref. Area    ms ms mV mV %  cm m/s m/s mVms  R Ulnar - ADM     Wrist ADM 3.1 ?3.3 10.4 ?6.0 100 Wrist - ADM 7   30.2     B.Elbow ADM 7.6  9.4  90.9 B.Elbow - Wrist 22 49 ?49 29.5     A.Elbow ADM 9.6  9.4  100  A.Elbow - B.Elbow 10 49 ?49 27.9         A.Elbow - Wrist      R Peroneal - EDB     Ankle EDB 7.6 ?6.5 1.7 ?2.0 100 Ankle - EDB 9   5.7     Fib head EDB 17.0  1.6  94.6 Fib head - Ankle 31 33 ?44 5.0     Pop fossa EDB 19.8  1.0  60.9 Pop fossa - Fib head 10 36 ?44 3.3         Pop fossa - Ankle      L Peroneal - EDB     Ankle EDB 6.9 ?6.5 0.9 ?2.0 100 Ankle - EDB 9   2.2     Fib head EDB 15.6  0.5  55.9 Fib head - Ankle 31 36 ?44 2.4     Pop fossa EDB 18.1  0.3  64.7 Pop fossa - Fib head 10 39 ?44 1.2         Pop fossa - Ankle      R Tibial - AH     Ankle AH 6.1 ?5.8 2.7 ?4.0 100 Ankle - AH 9   8.0     Pop fossa AH 17.7  1.6  61.6 Pop fossa - Ankle 42 36 ?41 11.1  L Tibial - AH     Ankle AH 7.7 ?5.8 2.6 ?4.0 100 Ankle - AH 9   8.1     Pop fossa AH 19.6  1.5  59.3 Pop fossa - Ankle 42 35 ?41 5.3  [place]    Nerve / Sites Rec. Site Peak Lat Ref.  Amp Ref. Segments Distance    ms ms V V  cm  R Sural - Ankle (Calf)     Calf Ankle NR ?4.4 NR ?6 Calf - Ankle 14  L Sural - Ankle (Calf)     Calf Ankle NR ?4.4 NR ?6 Calf - Ankle 14  R Superficial peroneal - Ankle     Lat leg Ankle NR ?4.4 NR ?6 Lat leg - Ankle 14  L Superficial peroneal - Ankle     Lat leg Ankle NR ?4.4 NR ?6 Lat leg - Ankle 14  R Ulnar - Orthodromic, (Dig V, Mid palm)     Dig V Wrist 3.1 ?3.1 7 ?5 Dig V - Wrist 50                F  Wave    Nerve F Lat Ref.   ms ms  R Tibial - AH 66.9 ?56.0  L Tibial - AH 67.1 ?56.0  R Ulnar - ADM 31.9 ?32.0           EMG full       EMG Summary Table    Spontaneous MUAP Recruitment  Muscle IA Fib PSW Fasc Other Amp Dur. Poly Pattern  R. Tibialis anterior Increased None None None _______ Normal Normal Normal Reduced  R. Tibialis posterior Normal None None None _______ Normal Normal Normal Reduced  R. Peroneus longus Normal None None None _______ Normal Normal Normal Reduced  R. Gastrocnemius (Medial head) Normal None None None _______ Normal Normal Normal Normal  R.  Vastus lateralis Normal None None None _______ Normal Normal Normal Normal  L. Tibialis anterior Normal None None None _______ Normal Normal Normal Reduced  L. Tibialis posterior Normal None None None _______ Normal Normal Normal Reduced  L. Peroneus longus Normal None None None _______ Normal Normal Normal Reduced  L. Gastrocnemius (Medial head) Normal None None None _______ Normal Normal Normal Reduced  L. Vastus lateralis Normal None None None _______ Normal Normal Normal Normal  R. Lumbar paraspinals (mid) Normal None None None _______ Normal Normal Normal Normal  R. Lumbar paraspinals (low) Normal None None None _______ Normal Normal Normal Normal  L. Lumbar paraspinals (mid) Normal None None None _______ Normal Normal Normal Normal  L. Lumbar paraspinals (low) Normal None None None _______ Normal Normal Normal Normal

## 2018-08-18 ENCOUNTER — Telehealth: Payer: Self-pay | Admitting: Neurology

## 2018-08-18 DIAGNOSIS — M545 Low back pain, unspecified: Secondary | ICD-10-CM

## 2018-08-18 DIAGNOSIS — G629 Polyneuropathy, unspecified: Secondary | ICD-10-CM

## 2018-08-18 DIAGNOSIS — R2689 Other abnormalities of gait and mobility: Secondary | ICD-10-CM

## 2018-08-18 DIAGNOSIS — R29898 Other symptoms and signs involving the musculoskeletal system: Secondary | ICD-10-CM

## 2018-08-18 DIAGNOSIS — R202 Paresthesia of skin: Secondary | ICD-10-CM

## 2018-08-18 DIAGNOSIS — R269 Unspecified abnormalities of gait and mobility: Secondary | ICD-10-CM

## 2018-08-18 NOTE — Telephone Encounter (Addendum)
Per vo by Dr. Krista Blue, please refer to home health - nursing to access for all disciplines, physical therapy.  Orders placed in Epic.  Returned call to patient.  She is aware to expect a call for scheduling once the services are approved by insurance.

## 2018-08-18 NOTE — Telephone Encounter (Signed)
Patient called today and stated that Dr. Krista Blue referred her for home health care at her last visit in October. She said that [facility] called her about a month ago and told her the referral we put in was wrong and they would call and let us know it needed to be changed. I do not see any record of the referral or any information about home health in the last office visit note? Please advise.

## 2018-08-18 NOTE — Addendum Note (Signed)
Addended by: Noberto Retort C on: 08/18/2018 04:04 PM   Modules accepted: Orders

## 2018-09-08 ENCOUNTER — Other Ambulatory Visit: Payer: Self-pay | Admitting: Cardiology

## 2018-09-16 ENCOUNTER — Other Ambulatory Visit: Payer: Self-pay | Admitting: Cardiology

## 2018-09-17 ENCOUNTER — Telehealth: Payer: Self-pay | Admitting: *Deleted

## 2018-09-17 NOTE — Telephone Encounter (Signed)
LVM for patient to call back in regards to scheduling AWV with our health coach.  

## 2018-09-22 ENCOUNTER — Telehealth: Payer: Self-pay | Admitting: *Deleted

## 2018-09-22 NOTE — Progress Notes (Signed)
Subjective:    Patient ID: [name], female    DOB: 01-23-1934, 85 y.o.   MRN: [id number]  HPI The patient is here for follow up.  Afib, Hypertension: She is taking her medication daily. She is compliant with a low sodium diet.  She denies chest pain, palpitations, edema, shortness of breath and regular headaches. She is not exercising regularly.  She does not monitor her blood pressure at home.    Hyperlipidemia: She is taking her medication daily. She is compliant with a low fat/cholesterol diet. She is not exercising regularly. She denies myalgias.   Poor balance, weakness in legs, neuropathy:  She follows with Dr Krista Blue.  She is taking gabapentin.  She has lower back pain when she first gets up only.  She has neuropathy and knee OA.  She uses a cane. She uses the walker sometimes.   Her balance is not good.  She is doing exercises at home on her own.  She denies falls.       Medications and allergies reviewed with patient and updated if appropriate.  Patient Active Problem List   Diagnosis Date Noted  . Neuropathy 07/03/2018  . Low back pain 06/03/2018  . Rectal prolapse, small 03/25/2018  . Leg weakness, bilateral 03/25/2018  . Hemorrhoids 09/25/2017  . Arthritis of sternoclavicular joint 06/10/2017  . Cervical radiculopathy 06/10/2017  . Thyroid nodule 03/25/2017  . Numbness in both hands 09/24/2016  . Poor balance 09/24/2016  . Irritable bowel syndrome (IBS) 02/24/2015  . Long term current use of anticoagulant therapy 02/07/2012  . Atrial fibrillation ([place]) 02/01/2012  . S/P dilatation of esophageal stricture 12/25/2010  . Osteopenia of the elderly 06/23/2010  . Vitamin D deficiency 06/20/2009  . RENAL CYST 12/20/2008  . CIGARETTE SMOKER 12/22/2007  . RESTLESS LEGS SYNDROME 12/22/2007  . Glaucoma 12/22/2007  . DIVERTICULOSIS OF COLON 12/22/2007  . HYPERCHOLESTEROLEMIA 11/25/2007  . Essential hypertension 11/25/2007  . Osteoarthritis 11/25/2007    Current  Outpatient Medications on File Prior to Visit  Medication Sig Dispense Refill  . atorvastatin (LIPITOR) 20 MG tablet TAKE 1 TABLET BY MOUTH DAILY AT 6PM 90 tablet 1  . brimonidine (ALPHAGAN) 0.2 % ophthalmic solution PLACE 1 DROP INTO THE LEFT EYE 2 TIMES DAILY.  11  . dorzolamide-timolol (COSOPT) 22.3-6.8 MG/ML ophthalmic solution Place 1 drop into both eyes 2 (two) times daily.    Marland Kitchen ELIQUIS 2.5 MG TABS tablet TAKE 1 TABLET (2.5 MG TOTAL) BY MOUTH 2 (TWO) TIMES DAILY. PLEASE SCHEDULE APPOINTMENT FOR REFILLS. 180 tablet 0  . gabapentin (NEURONTIN) 300 MG capsule TAKE 1 CAPSULE BY MOUTH THREE TIMES A DAY 270 capsule 3  . KLOR-CON M10 10 MEQ tablet TAKE ONE TABLET BY MOUTH EVERY DAY 90 tablet 0  . latanoprost (XALATAN) 0.005 % ophthalmic solution Place 1 drop into both eyes at bedtime.     Marland Kitchen losartan-hydrochlorothiazide (HYZAAR) 50-12.5 MG tablet TAKE 1 TABLET BY MOUTH EVERY DAY 90 tablet 1  . metoprolol tartrate (LOPRESSOR) 100 MG tablet Take 100 mg by mouth 2 (two) times daily.    . metoprolol tartrate (LOPRESSOR) 50 MG tablet TAKE 1 TABLET BY MOUTH TWICE A DAY 180 tablet 2  . pilocarpine (PILOCAR) 1 % ophthalmic solution Place 1 drop into the left eye 2 (two) times daily.    . [DISCONTINUED] potassium chloride (K-DUR) 10 MEQ tablet Take 1 tablet (10 mEq total) by mouth daily. 30 tablet 2   No current facility-administered medications on file prior to  visit.     Past Medical History:  Diagnosis Date  . Anxiety   . Atrial fibrillation ([place])   . Cigarette smoker   . Depression   . Diverticulosis of colon   . DJD (degenerative joint disease)   . Family history of colon cancer   . Glaucoma   . Hemorrhoids   . History of sebaceous cyst    R posterior scalp - observation elected 01/2015 after surgical eval  . Hyperlipidemia   . Hypertension   . Osteopenia   . Renal cyst   . Restless leg syndrome   . Vitamin D deficiency     Past Surgical History:  Procedure Laterality Date  .  CARDIOVERSION  04/23/2012   Procedure: CARDIOVERSION;  Surgeon: Hillary Bow, MD;  Location: [facility] ENDOSCOPY;  Service: Cardiovascular;  Laterality: N/A;  . CATARACT EXTRACTION    . COLONOSCOPY    . retina surgery x 2  2013  . TOTAL HIP ARTHROPLASTY  2/05   right by Dr. Tonita Cong  . UPPER GASTROINTESTINAL ENDOSCOPY      Social History   Socioeconomic History  . Marital status: Widowed    Spouse name: Not on file  . Number of children: 0  . Years of education: Not on file  . Highest education level: Not on file  Occupational History  . Occupation: Retired  Scientific laboratory technician  . Financial resource strain: Not on file  . Food insecurity:    Worry: Not on file    Inability: Not on file  . Transportation needs:    Medical: Not on file    Non-medical: Not on file  Tobacco Use  . Smoking status: Light Tobacco Smoker    Packs/day: 0.30  . Smokeless tobacco: Never Used  . Tobacco comment: 6 cig a day  Substance and Sexual Activity  . Alcohol use: No    Alcohol/week: 0.0 standard drinks  . Drug use: No  . Sexual activity: Not Currently  Lifestyle  . Physical activity:    Days per week: Not on file    Minutes per session: Not on file  . Stress: Not on file  Relationships  . Social connections:    Talks on phone: Not on file    Gets together: Not on file    Attends religious service: Not on file    Active member of club or organization: Not on file    Attends meetings of clubs or organizations: Not on file    Relationship status: Not on file  Other Topics Concern  . Not on file  Social History Narrative   Exercises 2 times per week   Caffeine use: 1 cup coffee per day   Lives alone, widowed 1978    Family History  Problem Relation Age of Onset  . Hypertension Mother   . Diabetes Mother   . Colon cancer Brother   . Prostate cancer Father   . Breast cancer Other        niece  . Diabetes Brother   . Diabetes Sister     Review of Systems  Constitutional: Negative for  chills and fever.  Respiratory: Positive for cough (from PND) and shortness of breath (at times). Negative for wheezing.   Cardiovascular: Negative for chest pain, palpitations and leg swelling.  Neurological: Negative for dizziness, light-headedness and headaches.       Objective:   Vitals:   09/23/18 1101  BP: (!) 148/72  Pulse: 84  Resp: 16  Temp: 98.5 F (36.9  C)  SpO2: 97%   BP Readings from Last 3 Encounters:  09/23/18 (!) 148/72  06/03/18 118/71  03/25/18 134/88   Wt Readings from Last 3 Encounters:  09/23/18 136 lb (61.7 kg)  06/03/18 130 lb (59 kg)  03/25/18 130 lb (59 kg)   Body mass index is 21.95 kg/m.   Physical Exam    Constitutional: Appears well-developed and well-nourished. No distress.  HENT:  Head: Normocephalic and atraumatic.  Neck: Neck supple. No tracheal deviation present. No thyromegaly present.  No cervical lymphadenopathy Cardiovascular: Normal rate, regular rhythm and normal heart sounds.   No murmur heard. No carotid bruit .  No edema Pulmonary/Chest: Effort normal and breath sounds normal. No respiratory distress. No has no wheezes. No rales.  Skin: Skin is warm and dry. Not diaphoretic.  Psychiatric: Normal mood and affect. Behavior is normal.      Assessment & Plan:    See Problem List for Assessment and Plan of chronic medical problems.

## 2018-09-22 NOTE — Telephone Encounter (Signed)
Called patient to schedule AWV, patient declined.  

## 2018-09-23 ENCOUNTER — Ambulatory Visit (INDEPENDENT_AMBULATORY_CARE_PROVIDER_SITE_OTHER): Payer: Medicare Other | Admitting: Internal Medicine

## 2018-09-23 ENCOUNTER — Encounter: Payer: Self-pay | Admitting: Internal Medicine

## 2018-09-23 ENCOUNTER — Other Ambulatory Visit (INDEPENDENT_AMBULATORY_CARE_PROVIDER_SITE_OTHER): Payer: Medicare Other

## 2018-09-23 VITALS — BP 148/72 | HR 84 | Temp 98.5°F | Resp 16 | Ht 66.0 in | Wt 136.0 lb

## 2018-09-23 DIAGNOSIS — I4821 Permanent atrial fibrillation: Secondary | ICD-10-CM | POA: Diagnosis not present

## 2018-09-23 DIAGNOSIS — R2689 Other abnormalities of gait and mobility: Secondary | ICD-10-CM

## 2018-09-23 DIAGNOSIS — E7800 Pure hypercholesterolemia, unspecified: Secondary | ICD-10-CM | POA: Diagnosis not present

## 2018-09-23 DIAGNOSIS — I10 Essential (primary) hypertension: Secondary | ICD-10-CM

## 2018-09-23 DIAGNOSIS — M545 Low back pain, unspecified: Secondary | ICD-10-CM

## 2018-09-23 LAB — CBC WITH DIFFERENTIAL/PLATELET
BASOS PCT: 0.7 % (ref 0.0–3.0)
Basophils Absolute: 0 10*3/uL (ref 0.0–0.1)
EOS PCT: 3.2 % (ref 0.0–5.0)
Eosinophils Absolute: 0.1 10*3/uL (ref 0.0–0.7)
HCT: 44.7 % (ref 36.0–46.0)
HEMOGLOBIN: 14.7 g/dL (ref 12.0–15.0)
LYMPHS ABS: 1.2 10*3/uL (ref 0.7–4.0)
Lymphocytes Relative: 32.2 % (ref 12.0–46.0)
MCHC: 32.8 g/dL (ref 30.0–36.0)
MCV: 93.2 fl (ref 78.0–100.0)
MONO ABS: 0.4 10*3/uL (ref 0.1–1.0)
Monocytes Relative: 10.9 % (ref 3.0–12.0)
Neutro Abs: 1.9 10*3/uL (ref 1.4–7.7)
Neutrophils Relative %: 53 % (ref 43.0–77.0)
Platelets: 133 10*3/uL — ABNORMAL LOW (ref 150.0–400.0)
RBC: 4.79 Mil/uL (ref 3.87–5.11)
RDW: 13.4 % (ref 11.5–15.5)
WBC: 3.6 10*3/uL — AB (ref 4.0–10.5)

## 2018-09-23 LAB — LIPID PANEL
CHOLESTEROL: 151 mg/dL (ref 0–200)
HDL: 62.3 mg/dL (ref >39.00)
LDL CALC: 76 mg/dL (ref 0–99)
NonHDL: 88.68
Total CHOL/HDL Ratio: 2
Triglycerides: 65 mg/dL (ref 0.0–149.0)
VLDL: 13 mg/dL (ref 0.0–40.0)

## 2018-09-23 LAB — COMPREHENSIVE METABOLIC PANEL
ALBUMIN: 4.1 g/dL (ref 3.5–5.2)
ALT: 19 U/L (ref 0–35)
AST: 20 U/L (ref 0–37)
Alkaline Phosphatase: 75 U/L (ref 39–117)
BUN: 18 mg/dL (ref 6–23)
CALCIUM: 9.6 mg/dL (ref 8.4–10.5)
CHLORIDE: 108 meq/L (ref 96–112)
CO2: 28 mEq/L (ref 19–32)
Creatinine, Ser: 0.96 mg/dL (ref 0.40–1.20)
GFR: 67.19 mL/min (ref >60.00)
Glucose, Bld: 72 mg/dL (ref 70–99)
Potassium: 4 mEq/L (ref 3.5–5.1)
Sodium: 144 mEq/L (ref 135–145)
Total Bilirubin: 0.8 mg/dL (ref 0.2–1.2)
Total Protein: 6.4 g/dL (ref 6.0–8.3)

## 2018-09-23 MED ORDER — LOSARTAN POTASSIUM-HCTZ 50-12.5 MG PO TABS: 1.0000 | ORAL_TABLET | Freq: Every day | ORAL | 1 refills | Status: DC

## 2018-09-23 MED ORDER — ATORVASTATIN CALCIUM 20 MG PO TABS: ORAL_TABLET | ORAL | 1 refills | Status: DC

## 2018-09-23 NOTE — Assessment & Plan Note (Signed)
Asymptomatic Rate controlled On metoprolol, Eliquis CBC, CMP

## 2018-09-23 NOTE — Assessment & Plan Note (Signed)
Check lipid panel  Continue daily statin Regular exercise and healthy diet encouraged  

## 2018-09-23 NOTE — Assessment & Plan Note (Signed)
Has seen neurology, Dr. Krista Blue Poor balance likely related to peripheral neuropathy, chronic lumbar radiculopathy and knee osteoarthritis Not eligible for home PT and defer to outpatient PT She is doing exercises at home and will continue Taking gabapentin Using a cane or walker Denies recent falls

## 2018-09-23 NOTE — Assessment & Plan Note (Signed)
Overall blood pressure well controlled Continue current medications at current doses CMP

## 2018-09-23 NOTE — Assessment & Plan Note (Signed)
She does have some lower back pain and generalized achiness Advised Tylenol twice daily

## 2018-09-23 NOTE — Patient Instructions (Addendum)
Try taking Tylenol - you can take two tabs twice a day.     Tests ordered today. Your results will be released to [facility] (or called to you) after review, usually within 72hours after test completion. If any changes need to be made, you will be notified at that same time.  Medications reviewed and updated.  Changes include :  none   Your prescription(s) have been submitted to your pharmacy. Please take as directed and contact our office if you believe you are having problem(s) with the medication(s).    Please followup in 6 months

## 2018-10-13 DIAGNOSIS — H401122 Primary open-angle glaucoma, left eye, moderate stage: Secondary | ICD-10-CM | POA: Diagnosis not present

## 2018-10-13 DIAGNOSIS — H401113 Primary open-angle glaucoma, right eye, severe stage: Secondary | ICD-10-CM | POA: Diagnosis not present

## 2018-12-03 ENCOUNTER — Other Ambulatory Visit: Payer: Self-pay | Admitting: Internal Medicine

## 2018-12-15 ENCOUNTER — Other Ambulatory Visit: Payer: Self-pay | Admitting: Cardiology

## 2018-12-19 ENCOUNTER — Telehealth: Payer: Self-pay

## 2018-12-19 NOTE — Telephone Encounter (Signed)
Left a detailed message for the patient about her upcoming in office visit on 12/29/2018 at 10AM with Dr. Stanford Breed and switching it to a video or telephone visit for the same date and time if possible and she can call the office back and someone will take care of rescheduling the appointment

## 2018-12-24 NOTE — Progress Notes (Signed)
Virtual Visit via Video Note changed to phone visit as patient did not have a smart phone.   This visit type was conducted due to national recommendations for restrictions regarding the COVID-19 Pandemic (e.g. social distancing) in an effort to limit this patient's exposure and mitigate transmission in our community.  Due to her co-morbid illnesses, this patient is at least at moderate risk for complications without adequate follow up.  This format is felt to be most appropriate for this patient at this time.  All issues noted in this document were discussed and addressed.  A limited physical exam was performed with this format.  Please refer to the patient's chart for her consent to telehealth for [facility].   Evaluation Performed:  Follow-up visit  Date:  12/29/2018   ID:  [name], DOB [birth year], MRN [id number]  Patient Location: Home Provider Location: Home  PCP:  Binnie Rail, MD  Cardiologist:  Dr Stanford Breed  Chief Complaint:  FU Atrial fibrillation  History of Present Illness:    FU atrial fibrillation. She has a hx of s/p failed DCCV in 8/13, HTN, HL, tobacco abuse. Echo 01/2012: EF 55-65%, normal wall motion, grade 2 diastolic dysfunction, mild MR. Holter 10/14 showed afib, rate controlled. Since last seen,  she has some dyspnea on exertion but no orthopnea, PND, pedal edema, chest pain, palpitations, syncope or bleeding.  The patient does not have symptoms concerning for COVID-19 infection (fever, chills, cough, or new shortness of breath).    Past Medical History:  Diagnosis Date  . Anxiety   . Atrial fibrillation ([place])   . Cigarette smoker   . Depression   . Diverticulosis of colon   . DJD (degenerative joint disease)   . Family history of colon cancer   . Glaucoma   . Hemorrhoids   . History of sebaceous cyst    R posterior scalp - observation elected 01/2015 after surgical eval  . Hyperlipidemia   . Hypertension   . Osteopenia   . Renal cyst   .  Restless leg syndrome   . Vitamin D deficiency    Past Surgical History:  Procedure Laterality Date  . CARDIOVERSION  04/23/2012   Procedure: CARDIOVERSION;  Surgeon: Hillary Bow, MD;  Location: [facility] ENDOSCOPY;  Service: Cardiovascular;  Laterality: N/A;  . CATARACT EXTRACTION    . COLONOSCOPY    . retina surgery x 2  2013  . TOTAL HIP ARTHROPLASTY  2/05   right by Dr. Tonita Cong  . UPPER GASTROINTESTINAL ENDOSCOPY       Current Meds  Medication Sig  . apixaban (ELIQUIS) 2.5 MG TABS tablet Take 1 tablet (2.5 mg total) by mouth 2 (two) times daily.  Marland Kitchen atorvastatin (LIPITOR) 20 MG tablet TAKE 1 TABLET BY MOUTH DAILY AT 6PM  . brimonidine (ALPHAGAN) 0.2 % ophthalmic solution PLACE 1 DROP INTO THE LEFT EYE 2 TIMES DAILY.  Marland Kitchen dorzolamide-timolol (COSOPT) 22.3-6.8 MG/ML ophthalmic solution Place 1 drop into both eyes 2 (two) times daily.  Marland Kitchen gabapentin (NEURONTIN) 300 MG capsule TAKE 1 CAPSULE BY MOUTH THREE TIMES A DAY  . KLOR-CON M10 10 MEQ tablet TAKE ONE TABLET BY MOUTH EVERY DAY  . latanoprost (XALATAN) 0.005 % ophthalmic solution Place 1 drop into both eyes at bedtime.   Marland Kitchen losartan-hydrochlorothiazide (HYZAAR) 50-12.5 MG tablet Take 1 tablet by mouth daily.  . metoprolol tartrate (LOPRESSOR) 50 MG tablet TAKE 1 TABLET BY MOUTH TWICE A DAY  . pilocarpine (PILOCAR) 1 % ophthalmic solution Place  1 drop into the left eye 2 (two) times daily.     Allergies:   Patient has no known allergies.   Social History   Tobacco Use  . Smoking status: Light Tobacco Smoker    Packs/day: 0.30  . Smokeless tobacco: Never Used  . Tobacco comment: 6 cig a day  Substance Use Topics  . Alcohol use: No    Alcohol/week: 0.0 standard drinks  . Drug use: No     Family Hx: The patient's family history includes Breast cancer in an other family member; Colon cancer in her brother; Diabetes in her brother, mother, and sister; Hypertension in her mother; Prostate cancer in her father.  ROS:   Please see  the history of present illness.   Some fatigue Patient denies fevers, chills or productive cough. All other systems reviewed and are negative.   Recent Labs: 09/23/2018: ALT 19; BUN 18; Creatinine, Ser 0.96; Hemoglobin 14.7; Platelets 133.0; Potassium 4.0; Sodium 144   Recent Lipid Panel Lab Results  Component Value Date/Time   CHOL 151 09/23/2018 11:42 AM   TRIG 65.0 09/23/2018 11:42 AM   HDL 62.30 09/23/2018 11:42 AM   CHOLHDL 2 09/23/2018 11:42 AM   LDLCALC 76 09/23/2018 11:42 AM    Wt Readings from Last 3 Encounters:  12/29/18 130 lb (59 kg)  09/23/18 136 lb (61.7 kg)  06/03/18 130 lb (59 kg)     Objective:    Vital Signs:  Wt 130 lb (59 kg)   BMI 20.98 kg/m    VITAL SIGNS:  reviewed  Patient answers questions appropriately. Normal affect. No acute distress. Remainder of physical examination not performed (telehealth visit; coronavirus pandemic)  ASSESSMENT & PLAN:    1. Permanent atrial fibrillation-patient remains asymptomatic and therefore plan will be rate control and anticoagulation.  Continue present dose of beta-blocker as heart rate is controlled.  Continue apixaban.  Laboratories from September 23, 2018 personally reviewed.  Hemoglobin 14.7.  Creatinine 0.96. 2. Hypertension-blood pressure controlled.  Continue present medications and follow.   3. Hyperlipidemia-continue statin.   4. Tobacco abuse-patient counseled on discontinuing.  COVID-19 Education: The importance of social distancing was discussed today.  Time:   Today, I have spent 15  minutes with the patient with telehealth technology discussing the above problems.     Medication Adjustments/Labs and Tests Ordered: Current medicines are reviewed at length with the patient today.  Concerns regarding medicines are outlined above.   Tests Ordered: No orders of the defined types were placed in this encounter.   Medication Changes: No orders of the defined types were placed in this encounter.    Disposition:  Follow up in 6 month(s)  Signed, Kirk Ruths, MD  12/29/2018 9:50 AM     [facility]

## 2018-12-26 ENCOUNTER — Telehealth: Payer: Self-pay | Admitting: Cardiology

## 2018-12-26 NOTE — Telephone Encounter (Signed)
Home phone/ my chart declined/ consent/ pre reg completed °

## 2018-12-29 ENCOUNTER — Telehealth (INDEPENDENT_AMBULATORY_CARE_PROVIDER_SITE_OTHER): Payer: Medicare Other | Admitting: Cardiology

## 2018-12-29 ENCOUNTER — Encounter: Payer: Self-pay | Admitting: Cardiology

## 2018-12-29 VITALS — BP 119/76 | HR 94 | Wt 130.0 lb

## 2018-12-29 DIAGNOSIS — F17200 Nicotine dependence, unspecified, uncomplicated: Secondary | ICD-10-CM

## 2018-12-29 DIAGNOSIS — I10 Essential (primary) hypertension: Secondary | ICD-10-CM

## 2018-12-29 DIAGNOSIS — I4821 Permanent atrial fibrillation: Secondary | ICD-10-CM

## 2018-12-29 NOTE — Patient Instructions (Signed)

## 2019-01-21 DIAGNOSIS — H401122 Primary open-angle glaucoma, left eye, moderate stage: Secondary | ICD-10-CM | POA: Diagnosis not present

## 2019-01-21 DIAGNOSIS — H401113 Primary open-angle glaucoma, right eye, severe stage: Secondary | ICD-10-CM | POA: Diagnosis not present

## 2019-03-05 ENCOUNTER — Other Ambulatory Visit: Payer: Self-pay | Admitting: Cardiology

## 2019-03-05 NOTE — Telephone Encounter (Signed)
Age 85, weight 59kg, SCr 0.96 on 09/23/18, last OV May 2020, afib indication

## 2019-03-09 ENCOUNTER — Other Ambulatory Visit: Payer: Self-pay | Admitting: Internal Medicine

## 2019-03-16 ENCOUNTER — Other Ambulatory Visit: Payer: Self-pay | Admitting: Internal Medicine

## 2019-03-22 NOTE — Progress Notes (Signed)
Subjective:    Patient ID: [name], female    DOB: 1934-08-14, 85 y.o.   MRN: [id number]  HPI The patient is here for follow up.  Afib, Hypertension: She is taking her medication daily. She is compliant with a low sodium diet.  She denies chest pain, edema and regular headaches. She does not monitor her blood pressure at home.  She is up-to-date with cardiology visits.  Hyperlipidemia: She is taking her medication daily. She is compliant with a low fat/cholesterol diet. She denies myalgias.   Poor balance, weakness, neuropathy:  She follows with neurology and is taking gabapentin.  She has done home PT in the past and did not find it helpful.  She is doing some leg exercises and walking around her house.  She uses a cane or walker.  She denies feet numbness.  She does feel her balance is poor and feels her legs are getting weaker.    Medications and allergies reviewed with patient and updated if appropriate.  Patient Active Problem List   Diagnosis Date Noted  . Neuropathy 07/03/2018  . Low back pain 06/03/2018  . Rectal prolapse, small 03/25/2018  . Leg weakness, bilateral 03/25/2018  . Hemorrhoids 09/25/2017  . Arthritis of sternoclavicular joint 06/10/2017  . Cervical radiculopathy 06/10/2017  . Thyroid nodule 03/25/2017  . Numbness in both hands 09/24/2016  . Poor balance 09/24/2016  . Irritable bowel syndrome (IBS) 02/24/2015  . Long term current use of anticoagulant therapy 02/07/2012  . Atrial fibrillation ([place]) 02/01/2012  . S/P dilatation of esophageal stricture 12/25/2010  . Osteopenia of the elderly 06/23/2010  . Vitamin D deficiency 06/20/2009  . RENAL CYST 12/20/2008  . CIGARETTE SMOKER 12/22/2007  . RESTLESS LEGS SYNDROME 12/22/2007  . Glaucoma 12/22/2007  . DIVERTICULOSIS OF COLON 12/22/2007  . HYPERCHOLESTEROLEMIA 11/25/2007  . Essential hypertension 11/25/2007  . Osteoarthritis 11/25/2007    Current Outpatient Medications on File Prior to  Visit  Medication Sig Dispense Refill  . atorvastatin (LIPITOR) 20 MG tablet TAKE 1 TABLET BY MOUTH DAILY AT 6PM 90 tablet 1  . brimonidine (ALPHAGAN) 0.2 % ophthalmic solution PLACE 1 DROP INTO THE LEFT EYE 2 TIMES DAILY.  11  . dorzolamide-timolol (COSOPT) 22.3-6.8 MG/ML ophthalmic solution Place 1 drop into both eyes 2 (two) times daily.    Marland Kitchen ELIQUIS 2.5 MG TABS tablet TAKE 1 TABLET BY MOUTH TWICE A DAY 180 tablet 1  . gabapentin (NEURONTIN) 300 MG capsule TAKE 1 CAPSULE BY MOUTH THREE TIMES A DAY 270 capsule 3  . KLOR-CON M10 10 MEQ tablet TAKE 1 TABLET BY MOUTH EVERY DAY 90 tablet 0  . latanoprost (XALATAN) 0.005 % ophthalmic solution Place 1 drop into both eyes at bedtime.     Marland Kitchen losartan-hydrochlorothiazide (HYZAAR) 50-12.5 MG tablet TAKE 1 TABLET BY MOUTH EVERY DAY 90 tablet 1  . metoprolol tartrate (LOPRESSOR) 50 MG tablet TAKE 1 TABLET BY MOUTH TWICE A DAY 180 tablet 2  . pilocarpine (PILOCAR) 1 % ophthalmic solution Place 1 drop into the left eye 2 (two) times daily.    . [DISCONTINUED] potassium chloride (K-DUR) 10 MEQ tablet Take 1 tablet (10 mEq total) by mouth daily. 30 tablet 2   No current facility-administered medications on file prior to visit.     Past Medical History:  Diagnosis Date  . Anxiety   . Atrial fibrillation ([place])   . Cigarette smoker   . Depression   . Diverticulosis of colon   . DJD (degenerative joint  disease)   . Family history of colon cancer   . Glaucoma   . Hemorrhoids   . History of sebaceous cyst    R posterior scalp - observation elected 01/2015 after surgical eval  . Hyperlipidemia   . Hypertension   . Osteopenia   . Renal cyst   . Restless leg syndrome   . Vitamin D deficiency     Past Surgical History:  Procedure Laterality Date  . CARDIOVERSION  04/23/2012   Procedure: CARDIOVERSION;  Surgeon: Hillary Bow, MD;  Location: [facility] ENDOSCOPY;  Service: Cardiovascular;  Laterality: N/A;  . CATARACT EXTRACTION    . COLONOSCOPY    .  retina surgery x 2  2013  . TOTAL HIP ARTHROPLASTY  2/05   right by Dr. Tonita Cong  . UPPER GASTROINTESTINAL ENDOSCOPY      Social History   Socioeconomic History  . Marital status: Widowed    Spouse name: Not on file  . Number of children: 0  . Years of education: Not on file  . Highest education level: Not on file  Occupational History  . Occupation: Retired  Scientific laboratory technician  . Financial resource strain: Not on file  . Food insecurity    Worry: Not on file    Inability: Not on file  . Transportation needs    Medical: Not on file    Non-medical: Not on file  Tobacco Use  . Smoking status: Light Tobacco Smoker    Packs/day: 0.30  . Smokeless tobacco: Never Used  . Tobacco comment: 6 cig a day  Substance and Sexual Activity  . Alcohol use: No    Alcohol/week: 0.0 standard drinks  . Drug use: No  . Sexual activity: Not Currently  Lifestyle  . Physical activity    Days per week: Not on file    Minutes per session: Not on file  . Stress: Not on file  Relationships  . Social Herbalist on phone: Not on file    Gets together: Not on file    Attends religious service: Not on file    Active member of club or organization: Not on file    Attends meetings of clubs or organizations: Not on file    Relationship status: Not on file  Other Topics Concern  . Not on file  Social History Narrative   Exercises 2 times per week   Caffeine use: 1 cup coffee per day   Lives alone, widowed 1978    Family History  Problem Relation Age of Onset  . Hypertension Mother   . Diabetes Mother   . Colon cancer Brother   . Prostate cancer Father   . Breast cancer Other        niece  . Diabetes Brother   . Diabetes Sister     Review of Systems  Constitutional: Negative for chills and fever.  Respiratory: Positive for cough and shortness of breath (same - chronic). Negative for wheezing.   Cardiovascular: Positive for palpitations. Negative for chest pain and leg swelling.   Neurological: Positive for headaches (occ). Negative for light-headedness.       Objective:   Vitals:   03/24/19 1053  BP: (!) 152/82  Pulse: 87  Resp: 15  Temp: 98.4 F (36.9 C)  SpO2: 96%   BP Readings from Last 3 Encounters:  03/24/19 (!) 152/82  12/29/18 119/76  09/23/18 (!) 148/72   Wt Readings from Last 3 Encounters:  03/24/19 129 lb (58.5 kg)  12/29/18  130 lb (59 kg)  09/23/18 136 lb (61.7 kg)   Body mass index is 20.82 kg/m.   Physical Exam    Constitutional: Appears well-developed and well-nourished. No distress.  HENT:  Head: Normocephalic and atraumatic.  Neck: Neck supple. No tracheal deviation present. No thyromegaly present.  No cervical lymphadenopathy Cardiovascular: Normal rate, irregular rhythm and normal heart sounds.  No murmur heard.   No edema Pulmonary/Chest: Effort normal and breath sounds normal. No respiratory distress. No has no wheezes. No rales.  Skin: Skin is warm and dry. Not diaphoretic.  Psychiatric: Normal mood and affect. Behavior is normal.      Assessment & Plan:    See Problem List for Assessment and Plan of chronic medical problems.

## 2019-03-24 ENCOUNTER — Other Ambulatory Visit (INDEPENDENT_AMBULATORY_CARE_PROVIDER_SITE_OTHER): Payer: Medicare Other

## 2019-03-24 ENCOUNTER — Other Ambulatory Visit: Payer: Self-pay

## 2019-03-24 ENCOUNTER — Encounter: Payer: Self-pay | Admitting: Internal Medicine

## 2019-03-24 ENCOUNTER — Ambulatory Visit (INDEPENDENT_AMBULATORY_CARE_PROVIDER_SITE_OTHER): Payer: Medicare Other | Admitting: Internal Medicine

## 2019-03-24 VITALS — BP 152/82 | HR 87 | Temp 98.4°F | Resp 15 | Ht 66.0 in | Wt 129.0 lb

## 2019-03-24 DIAGNOSIS — R634 Abnormal weight loss: Secondary | ICD-10-CM | POA: Diagnosis not present

## 2019-03-24 DIAGNOSIS — R29898 Other symptoms and signs involving the musculoskeletal system: Secondary | ICD-10-CM

## 2019-03-24 DIAGNOSIS — I10 Essential (primary) hypertension: Secondary | ICD-10-CM | POA: Diagnosis not present

## 2019-03-24 DIAGNOSIS — I4821 Permanent atrial fibrillation: Secondary | ICD-10-CM

## 2019-03-24 DIAGNOSIS — E7800 Pure hypercholesterolemia, unspecified: Secondary | ICD-10-CM | POA: Diagnosis not present

## 2019-03-24 DIAGNOSIS — G629 Polyneuropathy, unspecified: Secondary | ICD-10-CM

## 2019-03-24 LAB — COMPREHENSIVE METABOLIC PANEL
ALT: 12 U/L (ref 0–35)
AST: 18 U/L (ref 0–37)
Albumin: 4.2 g/dL (ref 3.5–5.2)
Alkaline Phosphatase: 68 U/L (ref 39–117)
BUN: 16 mg/dL (ref 6–23)
CO2: 30 mEq/L (ref 19–32)
Calcium: 9.7 mg/dL (ref 8.4–10.5)
Chloride: 107 mEq/L (ref 96–112)
Creatinine, Ser: 0.95 mg/dL (ref 0.40–1.20)
GFR: 67.92 mL/min (ref >60.00)
Glucose, Bld: 78 mg/dL (ref 70–99)
Potassium: 3.5 mEq/L (ref 3.5–5.1)
Sodium: 142 mEq/L (ref 135–145)
Total Bilirubin: 0.7 mg/dL (ref 0.2–1.2)
Total Protein: 6.9 g/dL (ref 6.0–8.3)

## 2019-03-24 LAB — CBC WITH DIFFERENTIAL/PLATELET
Basophils Absolute: 0 10*3/uL (ref 0.0–0.1)
Basophils Relative: 0.6 % (ref 0.0–3.0)
Eosinophils Absolute: 0.1 10*3/uL (ref 0.0–0.7)
Eosinophils Relative: 2.9 % (ref 0.0–5.0)
HCT: 43.9 % (ref 36.0–46.0)
Hemoglobin: 14.4 g/dL (ref 12.0–15.0)
Lymphocytes Relative: 33.9 % (ref 12.0–46.0)
Lymphs Abs: 1.4 10*3/uL (ref 0.7–4.0)
MCHC: 32.7 g/dL (ref 30.0–36.0)
MCV: 93.8 fl (ref 78.0–100.0)
Monocytes Absolute: 0.5 10*3/uL (ref 0.1–1.0)
Monocytes Relative: 13.1 % — ABNORMAL HIGH (ref 3.0–12.0)
Neutro Abs: 2 10*3/uL (ref 1.4–7.7)
Neutrophils Relative %: 49.5 % (ref 43.0–77.0)
Platelets: 152 10*3/uL (ref 150.0–400.0)
RBC: 4.68 Mil/uL (ref 3.87–5.11)
RDW: 13.6 % (ref 11.5–15.5)
WBC: 4.1 10*3/uL (ref 4.0–10.5)

## 2019-03-24 LAB — TSH: TSH: 0.85 u[IU]/mL (ref 0.35–4.50)

## 2019-03-24 LAB — LIPID PANEL
Cholesterol: 143 mg/dL (ref 0–200)
HDL: 62.8 mg/dL (ref >39.00)
LDL Cholesterol: 67 mg/dL (ref 0–99)
NonHDL: 80.37
Total CHOL/HDL Ratio: 2
Triglycerides: 69 mg/dL (ref 0.0–149.0)
VLDL: 13.8 mg/dL (ref 0.0–40.0)

## 2019-03-24 NOTE — Patient Instructions (Signed)
  Tests ordered today. Your results will be released to [place] (or called to you) after review.  If any changes need to be made, you will be notified at that same time.   Medications reviewed and updated.  Changes include :   none    Please followup in 8 months

## 2019-03-24 NOTE — Assessment & Plan Note (Signed)
Since she was here 6 months ago she has lost 7 pounds She does state decreased appetite and is eating less Also discussed other causes-they feel this could be related to her not cooking and because of the [place] pandemic she is not going out to eat and getting full, nutritious meals.  She does do the best she can do.  She does drink Ensure We will continue to monitor

## 2019-03-24 NOTE — Assessment & Plan Note (Signed)
Following with neurology Taking gabapentin-we will continue Discussed PT because she does have poor balance and leg weakness, but she deferred at this time Encouraged her to continue leg exercises and being as active as possible Uses cane or walker

## 2019-03-24 NOTE — Assessment & Plan Note (Addendum)
Permanent A. fib Has occasional palpitations Following with cardiology On metoprolol, Eliquis CBC, CMP, TSH

## 2019-03-24 NOTE — Assessment & Plan Note (Signed)
Blood pressure slightly elevated here today, but overall appears to be well controlled Continue current medications CMP

## 2019-03-24 NOTE — Assessment & Plan Note (Signed)
She feels her legs are getting weaker.  Has seen neurology and this is likely multifactorial-related to chronic back pain, knee arthritis and neuropathy Deferred PT She is doing some exercises at home and will continue-encouraged her to be as active as possible in a safe way Continue gabapentin Continue to use cane or walker

## 2019-05-05 IMAGING — US US THYROID
1 series · 13 of 25 positions shown · non-contrast
Comparison: None.

CLINICAL DATA: Difficulty swallowing. Evaluate for thyroid nodules.

EXAM:
THYROID ULTRASOUND
TECHNIQUE: Ultrasound examination of the thyroid gland and adjacent soft
tissues was performed.

[Series 1: us thyroid · 0.06mm/px · 13 of 65 slices shown]
[im 1/65]
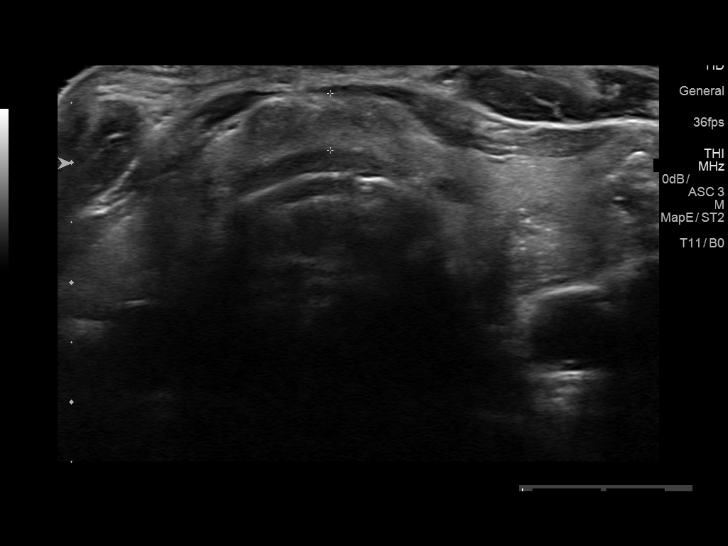
[im 6/65]
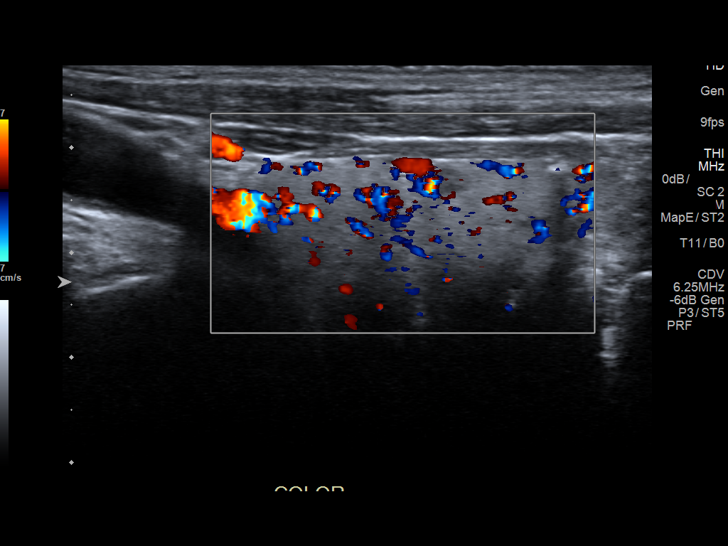
[im 11/65]
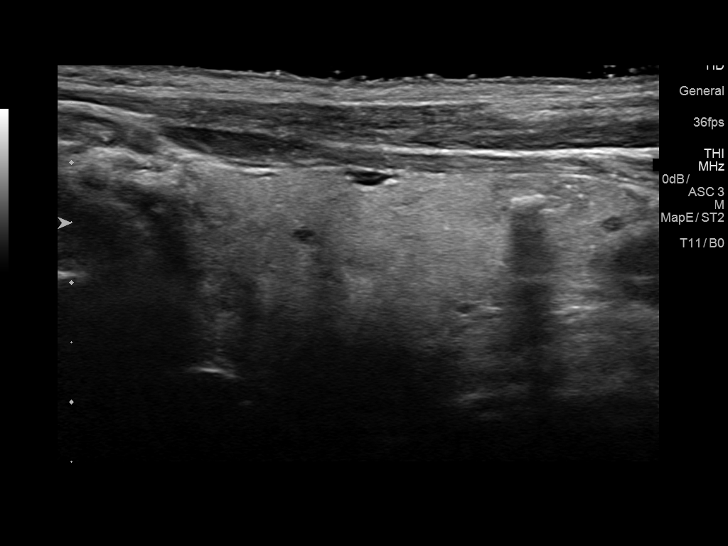
[im 17/65]
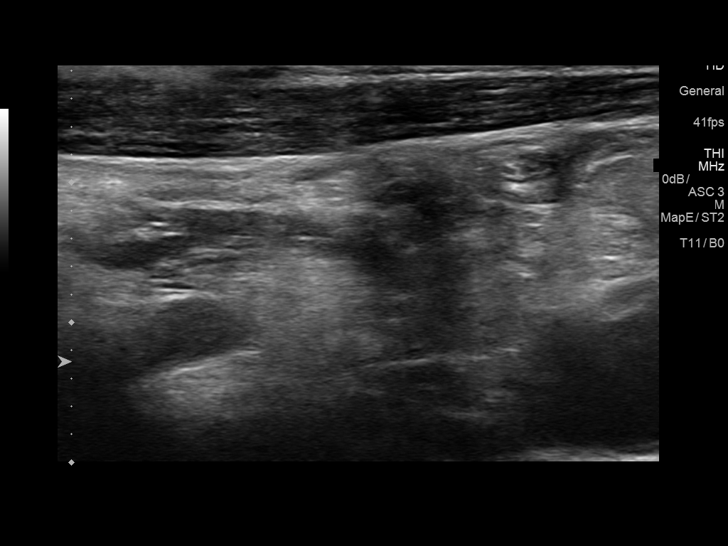
[im 22/65]
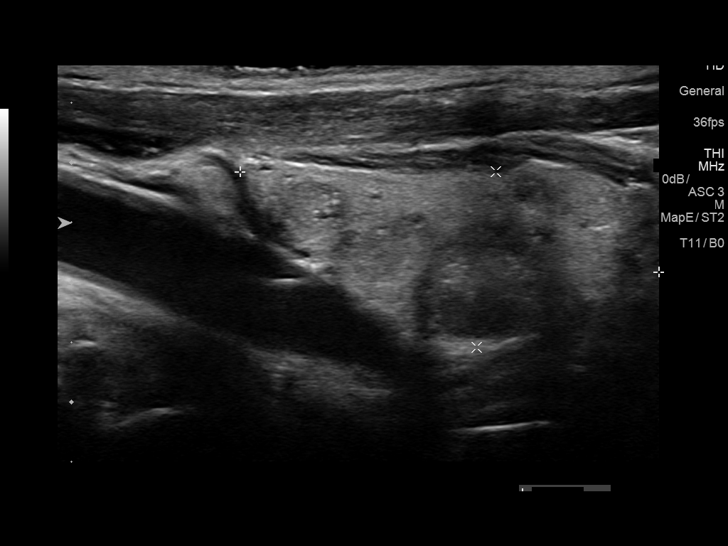
[im 27/65]
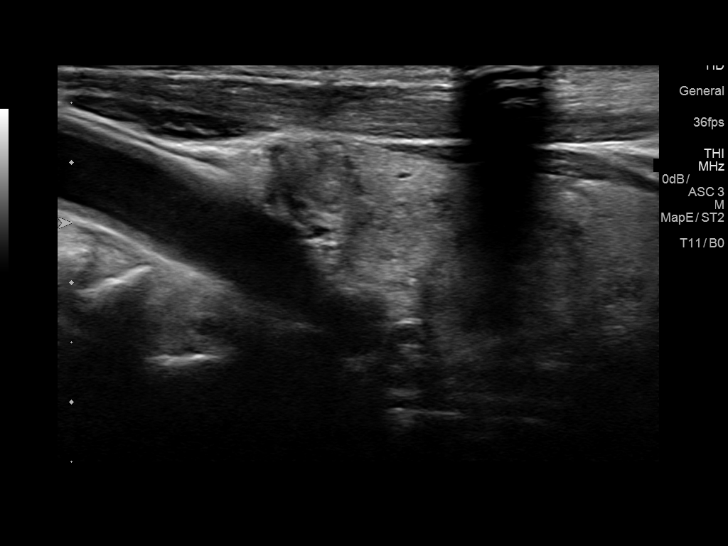
[im 33/65]
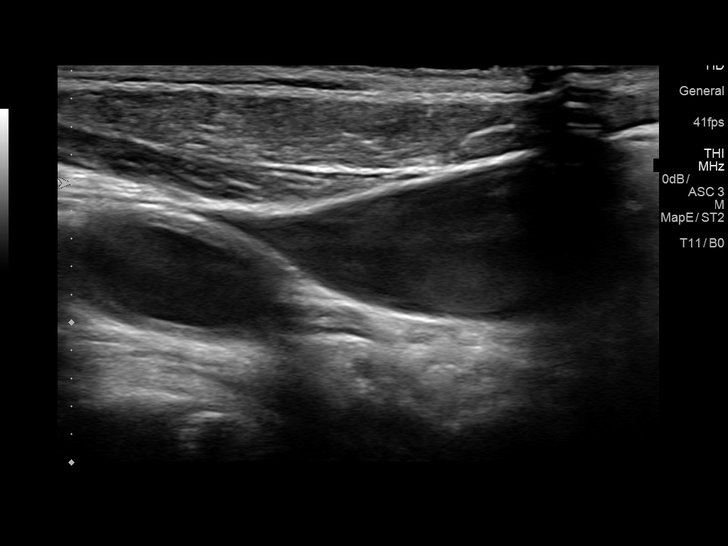
[im 38/65]
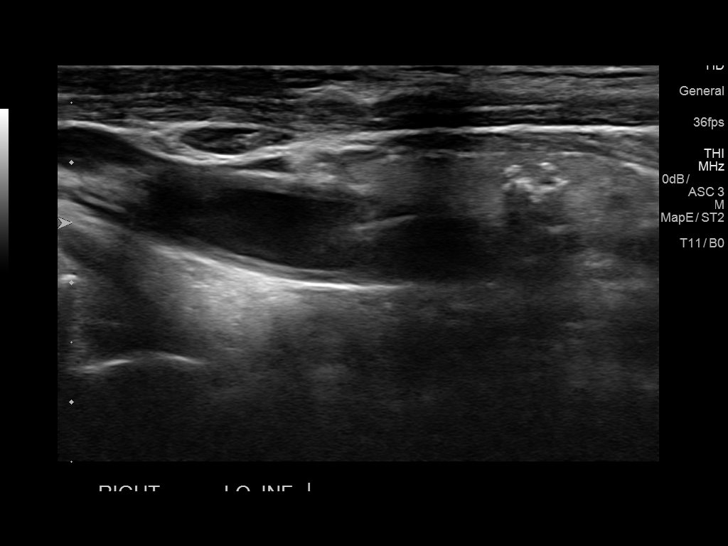
[im 43/65]
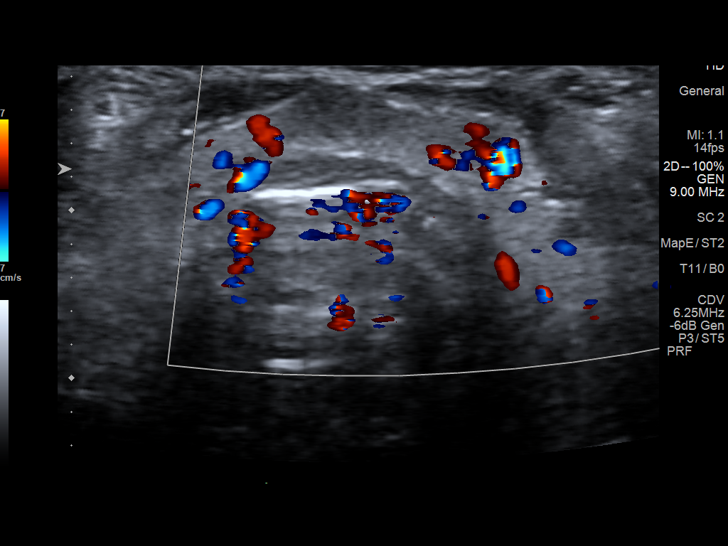
[im 49/65]
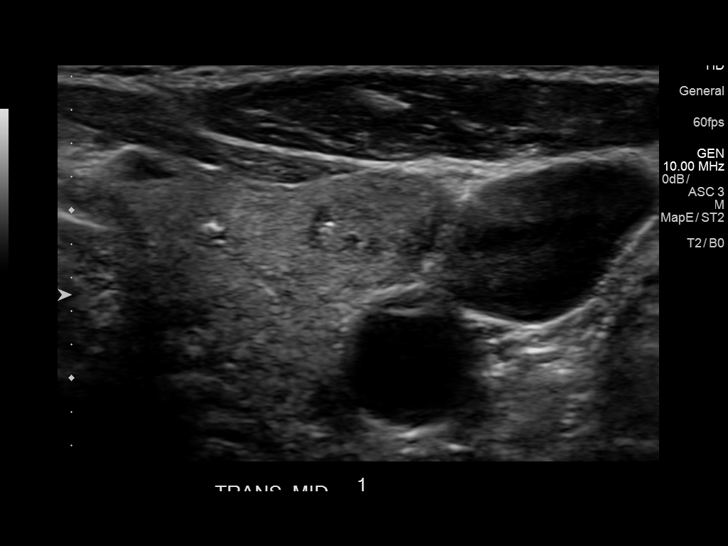
[im 54/65]
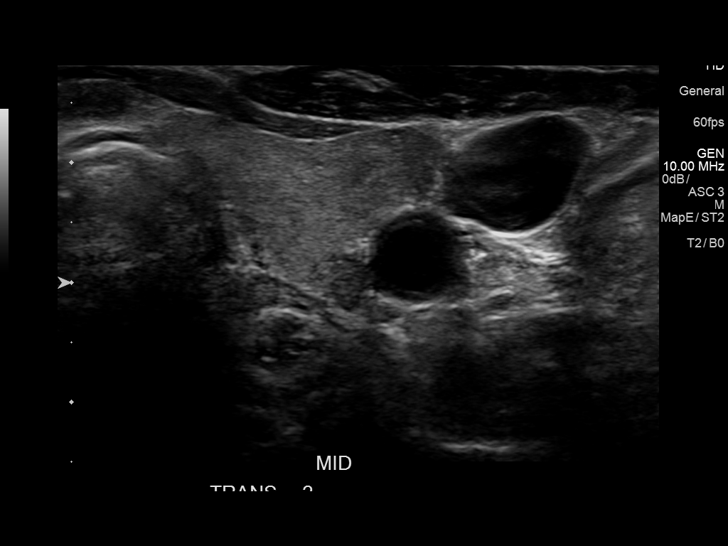
[im 59/65]
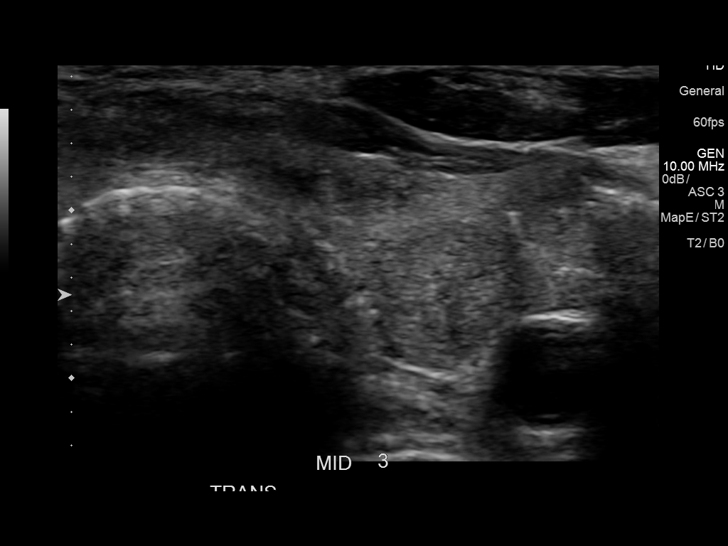
[im 65/65]
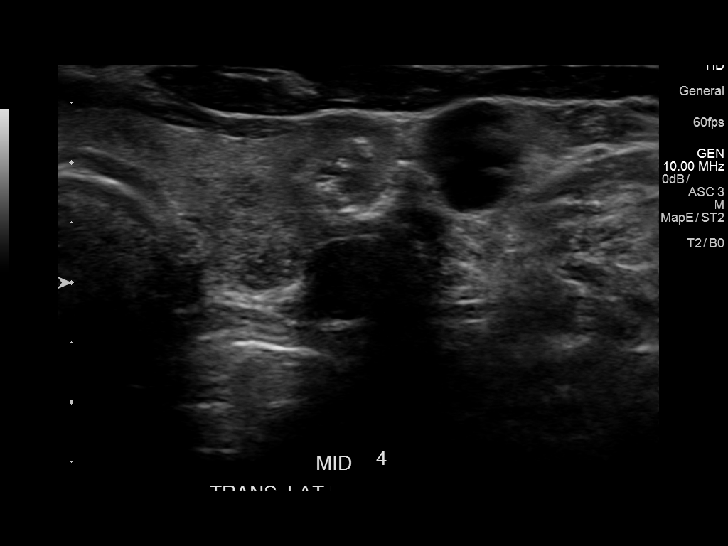

[13 of 25 positions shown; findings below may reference images not displayed]

FINDINGS: Parenchymal Echotexture: Moderately heterogenous

Isthmus: 0.5 cm

Right lobe: 4.0 x 1.4 x 1.4 cm

Left lobe: 3.6 x 1.5 x 2.0 cm

_________________________________________________________

Estimated total number of nodules >/= 1 cm: 2

Number of spongiform nodules >/=  2 cm not described below (TR1): 0

Number of mixed cystic and solid nodules >/= 1.5 cm not described
below (TR2): 0

_________________________________________________________

Nodule # 1:

Location: Right; Inferior

Maximum size: 0.6 cm

Composition: cannot determine (2)

Echogenicity: cannot determine (1)

Shape: not taller-than-wide (0)

Margins: cannot determine (0)

Echogenic foci: peripheral calcifications (2)

ACR TI-RADS total points: 5.

ACR TI-RADS risk category: TR4 (4-6 points).

ACR TI-RADS recommendations:

Given size (<0.9 cm) and appearance, this nodule does NOT meet
TI-RADS criteria for biopsy or dedicated follow-up.

_________________________________________________________

Nodule # 2:

Location: Left; Mid

Maximum size: 1.5 cm; Other 2 dimensions: 0.8 x 0.8 cm

Composition: solid/almost completely solid (2)

Echogenicity: hypoechoic (2)

Shape: not taller-than-wide (0)

Margins: ill-defined (0)

Echogenic foci: none (0)

ACR TI-RADS total points: 4.

ACR TI-RADS risk category: TR4 (4-6 points).

ACR TI-RADS recommendations:

**Given size (>/= 1.5 cm) and appearance, fine needle aspiration of
this moderately suspicious nodule should be considered based on
TI-RADS criteria.

_________________________________________________________

Nodule # 3:

Location: Left; Inferior

Maximum size: 1.5 cm; Other 2 dimensions: 0.8 x 0.8 cm

Composition: solid/almost completely solid (2)

Echogenicity: cannot determine (1)

Shape: not taller-than-wide (0)

Margins: ill-defined (0)

Echogenic foci: macrocalcifications (1)

ACR TI-RADS total points: 4.

ACR TI-RADS risk category: TR4 (4-6 points).

ACR TI-RADS recommendations:

**Given size (>/= 1.5 cm) and appearance, fine needle aspiration of
this moderately suspicious nodule should be considered based on
TI-RADS criteria.

_________________________________________________________

There are additional subcentimeter left thyroid nodules.
IMPRESSION: Multiple thyroid nodules.

Nodule #2 and # 3 in the left thyroid lobe both meet criteria for
ultrasound-guided biopsy.

The above is in keeping with the ACR TI-RADS recommendations - [HOSPITAL] 7998;[DATE].

## 2019-05-14 ENCOUNTER — Other Ambulatory Visit: Payer: Self-pay

## 2019-05-14 ENCOUNTER — Ambulatory Visit (INDEPENDENT_AMBULATORY_CARE_PROVIDER_SITE_OTHER): Payer: Medicare Other

## 2019-05-14 DIAGNOSIS — Z23 Encounter for immunization: Secondary | ICD-10-CM | POA: Diagnosis not present

## 2019-06-11 ENCOUNTER — Other Ambulatory Visit: Payer: Self-pay | Admitting: Cardiology

## 2019-06-29 DIAGNOSIS — H401122 Primary open-angle glaucoma, left eye, moderate stage: Secondary | ICD-10-CM | POA: Diagnosis not present

## 2019-06-29 DIAGNOSIS — H401113 Primary open-angle glaucoma, right eye, severe stage: Secondary | ICD-10-CM | POA: Diagnosis not present

## 2019-08-03 NOTE — Progress Notes (Signed)
HPI: FU atrial fibrillation. She has a hx of s/p failed DCCV in 8/13, HTN, HL, tobacco abuse. Echo 01/2012: EF 55-65%, normal wall motion, grade 2 diastolic dysfunction, mild MR. Holter 10/14 showed afib, rate controlled. Since last seen,patient describes some dizziness with standing and moving.  Also weakness.  She has mild dyspnea on exertion.  She denies chest pain or syncope.  No pedal edema.  Current Outpatient Medications  Medication Sig Dispense Refill  . atorvastatin (LIPITOR) 20 MG tablet TAKE 1 TABLET BY MOUTH DAILY AT 6PM 90 tablet 1  . brimonidine (ALPHAGAN) 0.2 % ophthalmic solution PLACE 1 DROP INTO THE LEFT EYE 2 TIMES DAILY.  11  . dorzolamide-timolol (COSOPT) 22.3-6.8 MG/ML ophthalmic solution Place 1 drop into both eyes 2 (two) times daily.    Marland Kitchen ELIQUIS 2.5 MG TABS tablet TAKE 1 TABLET BY MOUTH TWICE A DAY 180 tablet 1  . gabapentin (NEURONTIN) 300 MG capsule TAKE 1 CAPSULE BY MOUTH THREE TIMES A DAY 270 capsule 3  . KLOR-CON M10 10 MEQ tablet TAKE 1 TABLET BY MOUTH EVERY DAY 90 tablet 0  . latanoprost (XALATAN) 0.005 % ophthalmic solution Place 1 drop into both eyes at bedtime.     Marland Kitchen losartan-hydrochlorothiazide (HYZAAR) 50-12.5 MG tablet TAKE 1 TABLET BY MOUTH EVERY DAY 90 tablet 1  . metoprolol tartrate (LOPRESSOR) 50 MG tablet TAKE 1 TABLET BY MOUTH TWICE A DAY 180 tablet 2  . pilocarpine (PILOCAR) 1 % ophthalmic solution Place 1 drop into the left eye 2 (two) times daily.     No current facility-administered medications for this visit.     Past Medical History:  Diagnosis Date  . Anxiety   . Atrial fibrillation ([place])   . Cigarette smoker   . Depression   . Diverticulosis of colon   . DJD (degenerative joint disease)   . Family history of colon cancer   . Glaucoma   . Hemorrhoids   . History of sebaceous cyst    R posterior scalp - observation elected 01/2015 after surgical eval  . Hyperlipidemia   . Hypertension   . Osteopenia   . Renal cyst   .  Restless leg syndrome   . Vitamin D deficiency     Past Surgical History:  Procedure Laterality Date  . CARDIOVERSION  04/23/2012   Procedure: CARDIOVERSION;  Surgeon: Hillary Bow, MD;  Location: [facility] ENDOSCOPY;  Service: Cardiovascular;  Laterality: N/A;  . CATARACT EXTRACTION    . COLONOSCOPY    . retina surgery x 2  2013  . TOTAL HIP ARTHROPLASTY  2/05   right by Dr. Tonita Cong  . UPPER GASTROINTESTINAL ENDOSCOPY      Social History   Socioeconomic History  . Marital status: Widowed    Spouse name: Not on file  . Number of children: 0  . Years of education: Not on file  . Highest education level: Not on file  Occupational History  . Occupation: Retired  Tobacco Use  . Smoking status: Light Tobacco Smoker    Packs/day: 0.30  . Smokeless tobacco: Never Used  . Tobacco comment: 6 cig a day  Substance and Sexual Activity  . Alcohol use: No    Alcohol/week: 0.0 standard drinks  . Drug use: No  . Sexual activity: Not Currently  Other Topics Concern  . Not on file  Social History Narrative   Exercises 2 times per week   Caffeine use: 1 cup coffee per day   Lives alone,  widowed 1978   Social Determinants of Radio broadcast assistant Strain:   . Difficulty of Paying Living Expenses: Not on file  Food Insecurity:   . Worried About Charity fundraiser in the Last Year: Not on file  . Ran Out of Food in the Last Year: Not on file  Transportation Needs:   . Lack of Transportation (Medical): Not on file  . Lack of Transportation (Non-Medical): Not on file  Physical Activity:   . Days of Exercise per Week: Not on file  . Minutes of Exercise per Session: Not on file  Stress:   . Feeling of Stress : Not on file  Social Connections:   . Frequency of Communication with Friends and Family: Not on file  . Frequency of Social Gatherings with Friends and Family: Not on file  . Attends Religious Services: Not on file  . Active Member of Clubs or Organizations: Not on file  .  Attends Archivist Meetings: Not on file  . Marital Status: Not on file  Intimate Partner Violence:   . Fear of Current or Ex-Partner: Not on file  . Emotionally Abused: Not on file  . Physically Abused: Not on file  . Sexually Abused: Not on file    Family History  Problem Relation Age of Onset  . Hypertension Mother   . Diabetes Mother   . Colon cancer Brother   . Prostate cancer Father   . Breast cancer Other        niece  . Diabetes Brother   . Diabetes Sister     ROS: no fevers or chills, productive cough, hemoptysis, dysphasia, odynophagia, melena, hematochezia, dysuria, hematuria, rash, seizure activity, orthopnea, PND, pedal edema, claudication. Remaining systems are negative.  Physical Exam: Well-developed frail in no acute distress.  Skin is warm and dry.  HEENT is normal.  Neck is supple.  Chest is clear to auscultation with normal expansion.  Cardiovascular exam is irregular Abdominal exam nontender or distended. No masses palpated. Extremities show no edema. neuro grossly intact  ECG-atrial fibrillation at a rate of 89, normal axis, cannot rule out prior septal infarct, inferolateral T wave inversion.  Personally reviewed  A/P  1 permanent atrial fibrillation-plan to continue present dose of beta-blocker for rate control.  Continue apixaban.    2 hypertension-blood pressures is controlled.  However she describes some dizziness at times and generalized weakness.  I wonder if her blood pressure is running low as she does state symptoms are worse with standing and walking.  Discontinue Hyzaar to see if her symptoms improve with higher blood pressure.  Follow blood pressure and adjust medications as needed.  Given we are stopping Hyzaar I will also discontinue potassium.  3 hyperlipidemia-continue statin.    4 tobacco abuse-patient has been counseled on discontinuing.  Kirk Ruths, MD

## 2019-08-06 ENCOUNTER — Encounter (INDEPENDENT_AMBULATORY_CARE_PROVIDER_SITE_OTHER): Payer: Self-pay

## 2019-08-06 ENCOUNTER — Encounter: Payer: Self-pay | Admitting: Cardiology

## 2019-08-06 ENCOUNTER — Other Ambulatory Visit: Payer: Self-pay

## 2019-08-06 ENCOUNTER — Ambulatory Visit (INDEPENDENT_AMBULATORY_CARE_PROVIDER_SITE_OTHER): Payer: Medicare Other | Admitting: Cardiology

## 2019-08-06 VITALS — BP 118/76 | HR 89 | Temp 97.1°F | Ht 66.0 in | Wt 128.0 lb

## 2019-08-06 DIAGNOSIS — F17200 Nicotine dependence, unspecified, uncomplicated: Secondary | ICD-10-CM | POA: Diagnosis not present

## 2019-08-06 DIAGNOSIS — I4821 Permanent atrial fibrillation: Secondary | ICD-10-CM | POA: Diagnosis not present

## 2019-08-06 DIAGNOSIS — I10 Essential (primary) hypertension: Secondary | ICD-10-CM

## 2019-08-06 DIAGNOSIS — E7800 Pure hypercholesterolemia, unspecified: Secondary | ICD-10-CM

## 2019-08-06 NOTE — Patient Instructions (Signed)
Medication Instructions:  STOP LOSARTAN-HCTZ  STOP POTASSIUM  *If you need a refill on your cardiac medications before your next appointment, please call your pharmacy*  Lab Work: If you have labs (blood work) drawn today and your tests are completely normal, you will receive your results only by: Marland Kitchen MyChart Message (if you have MyChart) OR . A paper copy in the mail If you have any lab test that is abnormal or we need to change your treatment, we will call you to review the results.  Follow-Up: At [facility], you and your health needs are our priority.  As part of our continuing mission to provide you with exceptional heart care, we have created designated Provider Care Teams.  These Care Teams include your primary Cardiologist (physician) and Advanced Practice Providers (APPs -  Physician Assistants and Nurse Practitioners) who all work together to provide you with the care you need, when you need it.  Your next appointment:   6 week(s)  The format for your next appointment:   In Person  Provider:   You will see one of the following Advanced Practice Providers on your designated Care Team:    Kerin Ransom, PA-C  [place], [place]  Coletta Memos, [place]   Your physician wants you to follow-up in: [place] will receive a reminder letter in the mail two months in advance. If you don't receive a letter, please call our office to schedule the follow-up appointment.

## 2019-08-31 ENCOUNTER — Other Ambulatory Visit: Payer: Self-pay | Admitting: Internal Medicine

## 2019-09-02 ENCOUNTER — Other Ambulatory Visit: Payer: Self-pay

## 2019-09-02 MED ORDER — APIXABAN 2.5 MG PO TABS: 2.5000 mg | ORAL_TABLET | Freq: Two times a day (BID) | ORAL | 1 refills | Status: DC

## 2019-09-07 DIAGNOSIS — H401113 Primary open-angle glaucoma, right eye, severe stage: Secondary | ICD-10-CM | POA: Diagnosis not present

## 2019-09-07 DIAGNOSIS — H401122 Primary open-angle glaucoma, left eye, moderate stage: Secondary | ICD-10-CM | POA: Diagnosis not present

## 2019-09-13 ENCOUNTER — Other Ambulatory Visit: Payer: Self-pay | Admitting: Internal Medicine

## 2019-09-15 ENCOUNTER — Telehealth: Payer: Self-pay | Admitting: *Deleted

## 2019-09-15 NOTE — Telephone Encounter (Signed)
   [facility] Pre-operative Risk Assessment    Request for surgical clearance:  1. What type of surgery is being performed? Deep cleaning dental, dental extractions, full mouth debridement cleaning   2. When is this surgery scheduled? TBD   3. What type of clearance is required (medical clearance vs. Pharmacy clearance to hold med vs. Both)? pharmacy  4. Are there any medications that need to be held prior to surgery and how long? Eliquis   5. Practice name and name of physician performing surgery? [facility]   6. What is your office phone number [phone]    7.   What is your office fax number [phone]  8.   Anesthesia type (None, local, MAC, general) ? unknown   Michiah Mudry A Jaquetta Currier 09/15/2019, 5:20 PM  _________________________________________________________________

## 2019-09-16 NOTE — Telephone Encounter (Signed)
Patient with diagnosis of afib on Eliquis for anticoagulation.    Procedure:  Deep cleaning dental, dental extractions, full mouth debridement cleaning  Date of procedure: TBD  CHADS2-VASc score of  5 (CHF, HTN, AGE, AGE, female)  CrCl 41 ml/min  Per office protocol, patient can hold Eliquis for 1 day prior to procedure.

## 2019-09-18 ENCOUNTER — Ambulatory Visit: Payer: Medicare Other | Admitting: Medical

## 2019-09-21 ENCOUNTER — Telehealth: Payer: Self-pay | Admitting: Cardiology

## 2019-09-21 NOTE — Telephone Encounter (Signed)
We are recommending the COVID-19 vaccine to all of our patients. Cardiac medications (including blood thinners) should not deter anyone from being vaccinated and there is no need to hold any of those medications prior to vaccine administration.     Currently, there is a hotline to call (active 09/04/19) to schedule vaccination appointments as no walk-ins will be accepted.   Number: [phone].    If an appointment is not available please go to [email or web address] to sign up for notification when additional vaccine appointments are available.   If you have further questions or concerns about the vaccine process, please visit [email or web address] or contact your primary care physician.  Patient verbalized understanding. 

## 2019-09-23 ENCOUNTER — Other Ambulatory Visit: Payer: Self-pay

## 2019-09-23 ENCOUNTER — Ambulatory Visit (INDEPENDENT_AMBULATORY_CARE_PROVIDER_SITE_OTHER): Payer: Medicare Other | Admitting: Family Medicine

## 2019-09-23 ENCOUNTER — Encounter: Payer: Self-pay | Admitting: Cardiology

## 2019-09-23 VITALS — BP 138/88 | HR 84 | Temp 97.2°F | Ht 66.0 in | Wt 130.6 lb

## 2019-09-23 DIAGNOSIS — I10 Essential (primary) hypertension: Secondary | ICD-10-CM | POA: Diagnosis not present

## 2019-09-23 DIAGNOSIS — F17200 Nicotine dependence, unspecified, uncomplicated: Secondary | ICD-10-CM

## 2019-09-23 DIAGNOSIS — I4821 Permanent atrial fibrillation: Secondary | ICD-10-CM | POA: Diagnosis not present

## 2019-09-23 DIAGNOSIS — E7800 Pure hypercholesterolemia, unspecified: Secondary | ICD-10-CM | POA: Diagnosis not present

## 2019-09-23 NOTE — Patient Instructions (Signed)
Medication Instructions:  Your physician recommends that you continue on your current medications as directed. Please refer to the Current Medication list given to you today. *If you need a refill on your cardiac medications before your next appointment, please call your pharmacy*  Lab Work: None  If you have labs (blood work) drawn today and your tests are completely normal, you will receive your results only by: Marland Kitchen MyChart Message (if you have MyChart) OR . A paper copy in the mail If you have any lab test that is abnormal or we need to change your treatment, we will call you to review the results.  Testing/Procedures: none  Follow-Up: At [facility], you and your health needs are our priority.  As part of our continuing mission to provide you with exceptional heart care, we have created designated Provider Care Teams.  These Care Teams include your primary Cardiologist (physician) and Advanced Practice Providers (APPs -  Physician Assistants and Nurse Practitioners) who all work together to provide you with the care you need, when you need it.  Your next appointment:   3 month(s)  The format for your next appointment:   In Person  Provider:   Kirk Ruths, MD  Other Instructions: Check your blood pressure three times a week if your top number is above 140/90 please contact the office

## 2019-09-23 NOTE — Progress Notes (Signed)
Cardiology Office Note  Date: 09/23/2019   ID: [name], DOB 02/06/1934, MRN [id number]  PCP:  Binnie Rail, MD  Cardiologist:  Kirk Ruths, MD Electrophysiologist:  None   No chief complaint on file.   History of Present Illness: [name] is a 86 y.o. female last encounter with Dr. Stanford Breed August 06, 2019 for follow-up related to atrial fibrillation.  History of failed DCCV in 2013.  Other history includes hypertension, hyperlipidemia, tobacco abuse, previous echo in 2013 showed an ejection fraction of 55 to 65%, normal wall motion, grade 2 DD, mild MR, previous Holter monitor 2014 showed atrial fibrillation controlled rate.  At visit with Dr. Stanford Breed patient complained of some dizziness with standing and moving.  Also complained of mild dyspnea on exertion.  She had no complaints of chest pain or near syncope.  No lower extremity edema.  As a result of the complaint of dizziness upon standing and moving Dr. Stanford Breed discontinued Hyzaar in anticipation that symptoms would improve.  She was counseled on on tobacco cessation at that visit.  Medications reviewed today.  Current medications include;, atorvastatin 20 mg daily, Eliquis 2.5 mg twice daily, Klor-Con 10 mEq daily, metoprolol 50 mg p.o. twice daily.  Patient states her dizziness, lightheadedness, balance issues are better since stopping Hyzaar.  Blood pressure elevated on arrival today at 169/99.  Patient states she smoked a cigarette on the way into her appointment and was rushing to get to clinic this morning.  She denies any progressive anginal or exertional symptoms other than some mild dyspnea.  She continues to smoke approximately 8 cigarettes/day.  She admits to smoking for many years at least 40+ years    Past Medical History:  Diagnosis Date  . Anxiety   . Atrial fibrillation ([place])   . Cigarette smoker   . Depression   . Diverticulosis of colon   . DJD (degenerative joint disease)   .  Family history of colon cancer   . Glaucoma   . Hemorrhoids   . History of sebaceous cyst    R posterior scalp - observation elected 01/2015 after surgical eval  . Hyperlipidemia   . Hypertension   . Osteopenia   . Renal cyst   . Restless leg syndrome   . Vitamin D deficiency     Past Surgical History:  Procedure Laterality Date  . CARDIOVERSION  04/23/2012   Procedure: CARDIOVERSION;  Surgeon: Hillary Bow, MD;  Location: [facility] ENDOSCOPY;  Service: Cardiovascular;  Laterality: N/A;  . CATARACT EXTRACTION    . COLONOSCOPY    . retina surgery x 2  2013  . TOTAL HIP ARTHROPLASTY  2/05   right by Dr. Tonita Cong  . UPPER GASTROINTESTINAL ENDOSCOPY      Current Outpatient Medications  Medication Sig Dispense Refill  . atorvastatin (LIPITOR) 20 MG tablet TAKE 1 TABLET BY MOUTH DAILY AT 6PM 90 tablet 0  . apixaban (ELIQUIS) 2.5 MG TABS tablet Take 1 tablet (2.5 mg total) by mouth 2 (two) times daily. 180 tablet 1  . brimonidine (ALPHAGAN) 0.2 % ophthalmic solution PLACE 1 DROP INTO THE LEFT EYE 2 TIMES DAILY.  11  . dorzolamide-timolol (COSOPT) 22.3-6.8 MG/ML ophthalmic solution Place 1 drop into both eyes 2 (two) times daily.    Marland Kitchen gabapentin (NEURONTIN) 300 MG capsule TAKE 1 CAPSULE BY MOUTH THREE TIMES A DAY 270 capsule 0  . KLOR-CON M10 10 MEQ tablet TAKE 1 TABLET BY MOUTH EVERY DAY 90 tablet 0  .  latanoprost (XALATAN) 0.005 % ophthalmic solution Place 1 drop into both eyes at bedtime.     . metoprolol tartrate (LOPRESSOR) 50 MG tablet TAKE 1 TABLET BY MOUTH TWICE A DAY 180 tablet 2  . pilocarpine (PILOCAR) 1 % ophthalmic solution Place 1 drop into the left eye 2 (two) times daily.     No current facility-administered medications for this visit.   Allergies:  Patient has no known allergies.   Social History: The patient  reports that she has been smoking. She has been smoking about 0.30 packs per day. She has never used smokeless tobacco. She reports that she does not drink alcohol or  use drugs.   Family History: The patient's family history includes Breast cancer in an other family member; Colon cancer in her brother; Diabetes in her brother, mother, and sister; Hypertension in her mother; Prostate cancer in her father.   ROS:  Please see the history of present illness. Otherwise, complete review of systems is positive for none.  All other systems are reviewed and negative.   Physical Exam: VS:  There were no vitals taken for this visit., BMI There is no height or weight on file to calculate BMI.  Wt Readings from Last 3 Encounters:  08/06/19 128 lb (58.1 kg)  03/24/19 129 lb (58.5 kg)  12/29/18 130 lb (59 kg)    General: Patient appears comfortable at rest. Neck: Supple, no elevated JVP or carotid bruits, no thyromegaly. Lungs: Clear to auscultation, prolonged expiratory phase, nonlabored breathing at rest. Cardiac: Irregularly irregular rate and rhythm, no S3 or significant systolic murmur, no pericardial rub. Extremities: No pitting edema, distal pulses 2+. Skin: Warm and dry. Neuropsychiatric: Alert and oriented x3, affect grossly appropriate.  ECG:  An ECG dated September 23, 2019 was personally reviewed today and demonstrated:  Atrial fibrillation rate of 84, T wave abnormality, consider lateral ischemia.  Recent Labwork: 03/24/2019: ALT 12; AST 18; BUN 16; Creatinine, Ser 0.95; Hemoglobin 14.4; Platelets 152.0; Potassium 3.5; Sodium 142; TSH 0.85     Component Value Date/Time   CHOL 143 03/24/2019 1137   TRIG 69.0 03/24/2019 1137   HDL 62.80 03/24/2019 1137   CHOLHDL 2 03/24/2019 1137   VLDL 13.8 03/24/2019 1137   LDLCALC 67 03/24/2019 1137    Other Studies Reviewed Today:  Echocardiogram February 07, 2012 Study Conclusions   - Left ventricle: The cavity size was normal. Wall thickness  was normal. Systolic function was normal. The estimated  ejection fraction was in the range of 55% to 65%. Wall  motion was normal; there were no regional wall  motion  abnormalities. Features are consistent with a pseudonormal  left ventricular filling pattern, with concomitant  abnormal relaxation and increased filling pressure (grade  2 diastolic dysfunction).  - Mitral valve: Mild regurgitation.  - Atrial septum: No defect or patent foramen ovale was  identified.  Transthoracic echocardiography. M-mode, complete 2D,  spectral Doppler, and color Doppler. Height: Height:  170.2cm. Height: 67in. Weight: Weight: 59kg. Weight:  129.7lb. Body mass index: BMI: 20.4kg/m^2. Body surface  area:  BSA: 1.39m^2. Blood pressure:   138/64. Patient  status: Outpatient.    Assessment and Plan:  1. Permanent atrial fibrillation ([place])   2. Essential hypertension   3. CIGARETTE SMOKER   4. HYPERCHOLESTEROLEMIA    1. Permanent atrial fibrillation ([facility]) Atrial fibrillation is controlled today with a rate of 84.  Patient denies any sensation of palpitations or arrhythmias other than when increasing activity she notices her heart rate  increasing.  Denies any blood in urine or stool.  Continue Eliquis 2.5 mg p.o. twice daily, metoprolol 50 mg p.o. twice daily  2. Essential hypertension Initial blood pressure today on arrival 169/99.  Recheck in left arm 138/88.  Patient states she smoked a cigarette prior to arrival and was in a rush to get to the clinic.  Advised patient to start checking her blood pressure at least 3 times a week.  Call clinic if blood pressure sustained is at or above 140/90.  Advised her to make a log of blood pressures.  If blood pressure sustained at or above 140/90 may need to add low-dose amlodipine.  Patient states she would rather not start another blood pressure medication if possible.  3. CIGARETTE SMOKER Patient is a long-term smoker for 40+ years.  She continues to smoke approximately 8 cigarettes/day.  Highly advised cessation.  4. HYPERCHOLESTEROLEMIA Continue Lipitor 20 mg daily.  Recent lipid panel  showed total cholesterol 143, triglycerides 69, HDL 62.8, VLDL 13.8, LDL 67.  Continue Lipitor  Medication Adjustments/Labs and Tests Ordered: Current medicines are reviewed at length with the patient today.  Concerns regarding medicines are outlined above.    There are no Patient Instructions on file for this visit.       Signed, Levell July, NP 09/23/2019 8:59 AM    [facility] [place], [place], [place] [address] Phone: [phone]; Fax: [phone]

## 2019-11-22 NOTE — Patient Instructions (Addendum)
  Blood work was ordered.     Medications reviewed and updated.  Changes include :   Try Anoro - inhaler once a day for your shortness of breath  Your prescription(s) have been submitted to your pharmacy. Please take as directed and contact our office if you believe you are having problem(s) with the medication(s).    Please followup in 6 months

## 2019-11-22 NOTE — Progress Notes (Signed)
Subjective:    Patient ID: [name], female    DOB: May 20, 1934, 86 y.o.   MRN: [id number]  HPI The patient is here for follow up of their chronic medical problems, including Afib, hypertension, hyperlipidemia, neuropathy/weakness/poor balance.  She is here alone.  She is taking all of her medications as prescribed.    She states she is slowing down.  She thinks that is because she is getting older.  She does use a cane to ambulate because of her poor balance and neuropathy.  She has chronic shortness of breath with exertion.  She is still smoking and states she does not think she really wants to quit.  She does not smoke a lot.  Medications and allergies reviewed with patient and updated if appropriate.  Patient Active Problem List   Diagnosis Date Noted  . DOE (dyspnea on exertion) 11/23/2019  . Neuropathy 07/03/2018  . Low back pain 06/03/2018  . Rectal prolapse, small 03/25/2018  . Leg weakness, bilateral 03/25/2018  . Hemorrhoids 09/25/2017  . Arthritis of sternoclavicular joint 06/10/2017  . Cervical radiculopathy 06/10/2017  . Thyroid nodule 03/25/2017  . Numbness in both hands 09/24/2016  . Poor balance 09/24/2016  . Irritable bowel syndrome (IBS) 02/24/2015  . Long term current use of anticoagulant therapy 02/07/2012  . Atrial fibrillation ([place]) 02/01/2012  . S/P dilatation of esophageal stricture 12/25/2010  . Osteopenia of the elderly 06/23/2010  . Vitamin D deficiency 06/20/2009  . RENAL CYST 12/20/2008  . CIGARETTE SMOKER 12/22/2007  . RESTLESS LEGS SYNDROME 12/22/2007  . Glaucoma 12/22/2007  . DIVERTICULOSIS OF COLON 12/22/2007  . HYPERCHOLESTEROLEMIA 11/25/2007  . Essential hypertension 11/25/2007  . Osteoarthritis 11/25/2007    Current Outpatient Medications on File Prior to Visit  Medication Sig Dispense Refill  . apixaban (ELIQUIS) 2.5 MG TABS tablet Take 1 tablet (2.5 mg total) by mouth 2 (two) times daily. 180 tablet 1  . atorvastatin  (LIPITOR) 20 MG tablet TAKE 1 TABLET BY MOUTH DAILY AT 6PM 90 tablet 0  . brimonidine (ALPHAGAN) 0.2 % ophthalmic solution PLACE 1 DROP INTO THE LEFT EYE 2 TIMES DAILY.  11  . dorzolamide-timolol (COSOPT) 22.3-6.8 MG/ML ophthalmic solution Place 1 drop into both eyes 2 (two) times daily.    Marland Kitchen gabapentin (NEURONTIN) 300 MG capsule TAKE 1 CAPSULE BY MOUTH THREE TIMES A DAY 270 capsule 0  . KLOR-CON M10 10 MEQ tablet TAKE 1 TABLET BY MOUTH EVERY DAY 90 tablet 0  . latanoprost (XALATAN) 0.005 % ophthalmic solution Place 1 drop into both eyes at bedtime.     . metoprolol tartrate (LOPRESSOR) 50 MG tablet TAKE 1 TABLET BY MOUTH TWICE A DAY 180 tablet 2  . pilocarpine (PILOCAR) 1 % ophthalmic solution Place 1 drop into the left eye 2 (two) times daily.    . [DISCONTINUED] potassium chloride (K-DUR) 10 MEQ tablet Take 1 tablet (10 mEq total) by mouth daily. 30 tablet 2   No current facility-administered medications on file prior to visit.    Past Medical History:  Diagnosis Date  . Anxiety   . Atrial fibrillation ([place])   . Cigarette smoker   . Depression   . Diverticulosis of colon   . DJD (degenerative joint disease)   . Family history of colon cancer   . Glaucoma   . Hemorrhoids   . History of sebaceous cyst    R posterior scalp - observation elected 01/2015 after surgical eval  . Hyperlipidemia   . Hypertension   .  Osteopenia   . Renal cyst   . Restless leg syndrome   . Vitamin D deficiency     Past Surgical History:  Procedure Laterality Date  . CARDIOVERSION  04/23/2012   Procedure: CARDIOVERSION;  Surgeon: Hillary Bow, MD;  Location: [facility] ENDOSCOPY;  Service: Cardiovascular;  Laterality: N/A;  . CATARACT EXTRACTION    . COLONOSCOPY    . retina surgery x 2  2013  . TOTAL HIP ARTHROPLASTY  2/05   right by Dr. Tonita Cong  . UPPER GASTROINTESTINAL ENDOSCOPY      Social History   Socioeconomic History  . Marital status: Widowed    Spouse name: Not on file  . Number of  children: 0  . Years of education: Not on file  . Highest education level: Not on file  Occupational History  . Occupation: Retired  Tobacco Use  . Smoking status: Light Tobacco Smoker    Packs/day: 0.30  . Smokeless tobacco: Never Used  . Tobacco comment: 6 cig a day  Substance and Sexual Activity  . Alcohol use: No    Alcohol/week: 0.0 standard drinks  . Drug use: No  . Sexual activity: Not Currently  Other Topics Concern  . Not on file  Social History Narrative   Exercises 2 times per week   Caffeine use: 1 cup coffee per day   Lives alone, widowed 1978   Social Determinants of Health   Financial Resource Strain:   . Difficulty of Paying Living Expenses:   Food Insecurity:   . Worried About Charity fundraiser in the Last Year:   . Arboriculturist in the Last Year:   Transportation Needs:   . Film/video editor (Medical):   Marland Kitchen Lack of Transportation (Non-Medical):   Physical Activity:   . Days of Exercise per Week:   . Minutes of Exercise per Session:   Stress:   . Feeling of Stress :   Social Connections:   . Frequency of Communication with Friends and Family:   . Frequency of Social Gatherings with Friends and Family:   . Attends Religious Services:   . Active Member of Clubs or Organizations:   . Attends Archivist Meetings:   Marland Kitchen Marital Status:     Family History  Problem Relation Age of Onset  . Hypertension Mother   . Diabetes Mother   . Colon cancer Brother   . Prostate cancer Father   . Breast cancer Other        niece  . Diabetes Brother   . Diabetes Sister     Review of Systems  Constitutional: Negative for chills and fever.  Respiratory: Positive for shortness of breath (with activity). Negative for cough and wheezing.   Cardiovascular: Positive for leg swelling (minimal). Negative for chest pain and palpitations.  Musculoskeletal: Positive for arthralgias and back pain.  Neurological: Negative for light-headedness and  headaches.  Hematological: Does not bruise/bleed easily.       Objective:   Vitals:   11/23/19 1056 11/23/19 1135  BP: (!) 170/102 (!) 150/96  Pulse: 84   Resp: 18   Temp: 98 F (36.7 C)   SpO2: 98%    BP Readings from Last 3 Encounters:  11/23/19 (!) 150/96  09/23/19 138/88  08/06/19 118/76   Wt Readings from Last 3 Encounters:  11/23/19 128 lb (58.1 kg)  09/23/19 130 lb 9.6 oz (59.2 kg)  08/06/19 128 lb (58.1 kg)   Body mass index is 20.66 kg/m.  Physical Exam    Constitutional: Appears well-developed and well-nourished. No distress.  HENT:  Head: Normocephalic and atraumatic.  Neck: Neck supple. No tracheal deviation present. No thyromegaly present.  No cervical lymphadenopathy Cardiovascular: Normal rate, irregular rhythm and normal heart sounds.   No murmur heard. No carotid bruit .  Mild bilateral ankle edema Pulmonary/Chest: Effort normal and breath sounds normal. No respiratory distress. No has no wheezes. No rales.  Skin: Skin is warm and dry. Not diaphoretic.  Psychiatric: Normal mood and affect. Behavior is normal.      Assessment & Plan:    See Problem List for Assessment and Plan of chronic medical problems.    This visit occurred during the SARS-CoV-2 public health emergency.  Safety protocols were in place, including screening questions prior to the visit, additional usage of staff PPE, and extensive cleaning of exam room while observing appropriate contact time as indicated for disinfecting solutions.

## 2019-11-23 ENCOUNTER — Encounter: Payer: Self-pay | Admitting: Internal Medicine

## 2019-11-23 ENCOUNTER — Other Ambulatory Visit: Payer: Self-pay

## 2019-11-23 ENCOUNTER — Ambulatory Visit (INDEPENDENT_AMBULATORY_CARE_PROVIDER_SITE_OTHER): Payer: Medicare Other | Admitting: Internal Medicine

## 2019-11-23 VITALS — BP 150/96 | HR 84 | Temp 98.0°F | Resp 18 | Ht 66.0 in | Wt 128.0 lb

## 2019-11-23 DIAGNOSIS — G629 Polyneuropathy, unspecified: Secondary | ICD-10-CM | POA: Diagnosis not present

## 2019-11-23 DIAGNOSIS — R0600 Dyspnea, unspecified: Secondary | ICD-10-CM

## 2019-11-23 DIAGNOSIS — I4821 Permanent atrial fibrillation: Secondary | ICD-10-CM | POA: Diagnosis not present

## 2019-11-23 DIAGNOSIS — I10 Essential (primary) hypertension: Secondary | ICD-10-CM | POA: Diagnosis not present

## 2019-11-23 DIAGNOSIS — R0609 Other forms of dyspnea: Secondary | ICD-10-CM

## 2019-11-23 DIAGNOSIS — E7800 Pure hypercholesterolemia, unspecified: Secondary | ICD-10-CM

## 2019-11-23 LAB — CBC WITH DIFFERENTIAL/PLATELET
Basophils Absolute: 0 10*3/uL (ref 0.0–0.1)
Basophils Relative: 0.7 % (ref 0.0–3.0)
Eosinophils Absolute: 0.1 10*3/uL (ref 0.0–0.7)
Eosinophils Relative: 2.4 % (ref 0.0–5.0)
HCT: 40.1 % (ref 36.0–46.0)
Hemoglobin: 13.2 g/dL (ref 12.0–15.0)
Lymphocytes Relative: 28.8 % (ref 12.0–46.0)
Lymphs Abs: 1.1 10*3/uL (ref 0.7–4.0)
MCHC: 33 g/dL (ref 30.0–36.0)
MCV: 93.2 fl (ref 78.0–100.0)
Monocytes Absolute: 0.4 10*3/uL (ref 0.1–1.0)
Monocytes Relative: 10.9 % (ref 3.0–12.0)
Neutro Abs: 2.1 10*3/uL (ref 1.4–7.7)
Neutrophils Relative %: 57.2 % (ref 43.0–77.0)
Platelets: 132 10*3/uL — ABNORMAL LOW (ref 150.0–400.0)
RBC: 4.3 Mil/uL (ref 3.87–5.11)
RDW: 13.6 % (ref 11.5–15.5)
WBC: 3.7 10*3/uL — ABNORMAL LOW (ref 4.0–10.5)

## 2019-11-23 LAB — COMPREHENSIVE METABOLIC PANEL
ALT: 20 U/L (ref 0–35)
AST: 19 U/L (ref 0–37)
Albumin: 4 g/dL (ref 3.5–5.2)
Alkaline Phosphatase: 69 U/L (ref 39–117)
BUN: 16 mg/dL (ref 6–23)
CO2: 28 mEq/L (ref 19–32)
Calcium: 9.1 mg/dL (ref 8.4–10.5)
Chloride: 109 mEq/L (ref 96–112)
Creatinine, Ser: 0.86 mg/dL (ref 0.40–1.20)
GFR: 76.06 mL/min (ref >60.00)
Glucose, Bld: 83 mg/dL (ref 70–99)
Potassium: 4 mEq/L (ref 3.5–5.1)
Sodium: 142 mEq/L (ref 135–145)
Total Bilirubin: 0.8 mg/dL (ref 0.2–1.2)
Total Protein: 6.5 g/dL (ref 6.0–8.3)

## 2019-11-23 LAB — LIPID PANEL
Cholesterol: 145 mg/dL (ref 0–200)
HDL: 59.7 mg/dL (ref >39.00)
LDL Cholesterol: 74 mg/dL (ref 0–99)
NonHDL: 85.17
Total CHOL/HDL Ratio: 2
Triglycerides: 56 mg/dL (ref 0.0–149.0)
VLDL: 11.2 mg/dL (ref 0.0–40.0)

## 2019-11-23 MED ORDER — UMECLIDINIUM-VILANTEROL 62.5-25 MCG/INH IN AEPB: 1.0000 | INHALATION_SPRAY | Freq: Every day | RESPIRATORY_TRACT | 8 refills | Status: DC

## 2019-11-23 NOTE — Assessment & Plan Note (Signed)
Chronic Following with neurology Taking gabapentin, which she thinks does help Has poor balance-uses a cane to ambulate and often still needs to hang onto things Encouraged as much activity as possible Continue gabapentin at current dose

## 2019-11-23 NOTE — Assessment & Plan Note (Signed)
Chronic Following with cardiology On Eliquis, metoprolol Asymptomatic CBC, CMP

## 2019-11-23 NOTE — Assessment & Plan Note (Signed)
Chronic Blood pressure elevated here today, but her last couple of previous readings were very good Will recheck We will continue current medications at current doses CMP

## 2019-11-23 NOTE — Assessment & Plan Note (Signed)
Chronic She feels her shortness of breath with exertion is stable She is still smoking, but admits she is not 100% ready to quit Trial of Anoro see if this helps Declined a chest x-ray today Will monitor

## 2019-11-23 NOTE — Assessment & Plan Note (Signed)
Chronic Check lipid panel  Continue daily statin Regular exercise and healthy diet encouraged  

## 2019-11-26 ENCOUNTER — Telehealth: Payer: Self-pay | Admitting: Internal Medicine

## 2019-11-26 MED ORDER — SPIRIVA HANDIHALER 18 MCG IN CAPS: 18.0000 ug | ORAL_CAPSULE | Freq: Every day | RESPIRATORY_TRACT | 12 refills | Status: DC

## 2019-11-26 NOTE — Telephone Encounter (Signed)
I sent a different inhaler to her pharmacy.  Hopefully this is cheaper.  Let her know I cannot tell how much it is until it gets to the pharmacy.  If it is too expensive do not pick it up.

## 2019-11-26 NOTE — Telephone Encounter (Signed)
   Pt c/o medication issue:  1. Name of Medication: umeclidinium-vilanterol (ANORO ELLIPTA) 62.5-25 MCG/INH AEPB  2. How are you currently taking this medication (dosage and times per day)? Using sample 3. Are you having a reaction (difficulty breathing--STAT)? no  4. What is your medication issue? Patient states umeclidinium-vilanterol (ANORO ELLIPTA) 62.5-25 MCG/INH AEPB too costly ($200)

## 2019-11-26 NOTE — Telephone Encounter (Signed)
Pt aware.

## 2019-11-30 ENCOUNTER — Telehealth: Payer: Self-pay

## 2019-11-30 DIAGNOSIS — J449 Chronic obstructive pulmonary disease, unspecified: Secondary | ICD-10-CM

## 2019-11-30 NOTE — Telephone Encounter (Signed)
New message  The patient calling inhaler cost is too high, would Dr. Quay Burow recommend any inhaler over the counter due to fixed income.

## 2019-11-30 NOTE — Telephone Encounter (Signed)
There are no otc inhalers.  Does she want to see pulmonary to evaluate her lungs more to see what options there are?

## 2019-12-01 DIAGNOSIS — J449 Chronic obstructive pulmonary disease, unspecified: Secondary | ICD-10-CM | POA: Insufficient documentation

## 2019-12-01 NOTE — Telephone Encounter (Signed)
Will take a referral to pulmonary.

## 2019-12-07 DIAGNOSIS — H401122 Primary open-angle glaucoma, left eye, moderate stage: Secondary | ICD-10-CM | POA: Diagnosis not present

## 2019-12-07 DIAGNOSIS — H401113 Primary open-angle glaucoma, right eye, severe stage: Secondary | ICD-10-CM | POA: Diagnosis not present

## 2019-12-12 ENCOUNTER — Other Ambulatory Visit: Payer: Self-pay | Admitting: Internal Medicine

## 2019-12-16 NOTE — Progress Notes (Signed)
HPI: FU atrial fibrillation. She has a hx of s/p failed DCCV in 8/13, HTN, HL, tobacco abuse. Echo 01/2012: EF 55-65%, normal wall motion, grade 2 diastolic dysfunction, mild MR. Holter 10/14 showed afib, rate controlled. Since last seen, she does have dyspnea on exertion.  Occasional minimal pedal edema.  No chest pain, palpitations or syncope.  No bleeding.  Current Outpatient Medications  Medication Sig Dispense Refill  . apixaban (ELIQUIS) 2.5 MG TABS tablet Take 1 tablet (2.5 mg total) by mouth 2 (two) times daily. 180 tablet 1  . atorvastatin (LIPITOR) 20 MG tablet TAKE 1 TABLET BY MOUTH DAILY AT 6PM 90 tablet 1  . brimonidine (ALPHAGAN) 0.2 % ophthalmic solution Place 1 drop into the left eye 2 times daily.    . dorzolamide-timolol (COSOPT) 22.3-6.8 MG/ML ophthalmic solution Place 1 drop into both eyes 2 times daily.    Marland Kitchen gabapentin (NEURONTIN) 300 MG capsule TAKE 1 CAPSULE BY MOUTH THREE TIMES A DAY 270 capsule 0  . KLOR-CON M10 10 MEQ tablet TAKE 1 TABLET BY MOUTH EVERY DAY 90 tablet 0  . latanoprost (XALATAN) 0.005 % ophthalmic solution Place 1 drop into both eyes nightly.    . metoprolol tartrate (LOPRESSOR) 50 MG tablet TAKE 1 TABLET BY MOUTH TWICE A DAY 180 tablet 2  . pilocarpine (PILOCAR) 4 % ophthalmic solution Place 1 drop into the left eye 3 times daily.    Marland Kitchen tiotropium (SPIRIVA HANDIHALER) 18 MCG inhalation capsule Place 1 capsule (18 mcg total) into inhaler and inhale daily. 30 capsule 12   No current facility-administered medications for this visit.     Past Medical History:  Diagnosis Date  . Anxiety   . Atrial fibrillation ([place])   . Cigarette smoker   . Depression   . Diverticulosis of colon   . DJD (degenerative joint disease)   . Family history of colon cancer   . Glaucoma   . Hemorrhoids   . History of sebaceous cyst    R posterior scalp - observation elected 01/2015 after surgical eval  . Hyperlipidemia   . Hypertension   . Osteopenia   . Renal  cyst   . Restless leg syndrome   . Vitamin D deficiency     Past Surgical History:  Procedure Laterality Date  . CARDIOVERSION  04/23/2012   Procedure: CARDIOVERSION;  Surgeon: Hillary Bow, MD;  Location: [facility] ENDOSCOPY;  Service: Cardiovascular;  Laterality: N/A;  . CATARACT EXTRACTION    . COLONOSCOPY    . retina surgery x 2  2013  . TOTAL HIP ARTHROPLASTY  2/05   right by Dr. Tonita Cong  . UPPER GASTROINTESTINAL ENDOSCOPY      Social History   Socioeconomic History  . Marital status: Widowed    Spouse name: Not on file  . Number of children: 0  . Years of education: Not on file  . Highest education level: Not on file  Occupational History  . Occupation: Retired  Tobacco Use  . Smoking status: Light Tobacco Smoker    Packs/day: 0.30  . Smokeless tobacco: Never Used  . Tobacco comment: 6 cig a day  Substance and Sexual Activity  . Alcohol use: No    Alcohol/week: 0.0 standard drinks  . Drug use: No  . Sexual activity: Not Currently  Other Topics Concern  . Not on file  Social History Narrative   Exercises 2 times per week   Caffeine use: 1 cup coffee per day   Lives alone,  widowed 1978   Social Determinants of Radio broadcast assistant Strain:   . Difficulty of Paying Living Expenses:   Food Insecurity:   . Worried About Charity fundraiser in the Last Year:   . Arboriculturist in the Last Year:   Transportation Needs:   . Film/video editor (Medical):   Marland Kitchen Lack of Transportation (Non-Medical):   Physical Activity:   . Days of Exercise per Week:   . Minutes of Exercise per Session:   Stress:   . Feeling of Stress :   Social Connections:   . Frequency of Communication with Friends and Family:   . Frequency of Social Gatherings with Friends and Family:   . Attends Religious Services:   . Active Member of Clubs or Organizations:   . Attends Archivist Meetings:   Marland Kitchen Marital Status:   Intimate Partner Violence:   . Fear of Current or  Ex-Partner:   . Emotionally Abused:   Marland Kitchen Physically Abused:   . Sexually Abused:     Family History  Problem Relation Age of Onset  . Hypertension Mother   . Diabetes Mother   . Colon cancer Brother   . Prostate cancer Father   . Breast cancer Other        niece  . Diabetes Brother   . Diabetes Sister     ROS: no fevers or chills, productive cough, hemoptysis, dysphasia, odynophagia, melena, hematochezia, dysuria, hematuria, rash, seizure activity, orthopnea, PND, claudication. Remaining systems are negative.  Physical Exam: Well-developed well-nourished in no acute distress.  Skin is warm and dry.  HEENT is normal.  Neck is supple.  Chest diminished breath sounds throughout. Cardiovascular exam is irregular Abdominal exam nontender or distended. No masses palpated. Extremities show no edema. neuro grossly intact  A/P  1 permanent atrial fibrillation-patient doing well from a symptomatic standpoint.  Continue metoprolol at present dose.  Continue apixaban.  2 hypertension-blood pressure is elevated; add amlodipine 5 mg daily and follow.  3 hyperlipidemia-continue statin.  4 tobacco abuse-patient counseled on discontinuing.  Kirk Ruths, MD

## 2019-12-17 ENCOUNTER — Telehealth: Payer: Self-pay

## 2019-12-17 NOTE — Telephone Encounter (Signed)
New message    The patient picks up her medication yesterday atorvastatin (LIPITOR) 20 MG tablet on the bottle is listed calcium.   The patient voiced she has been getting this medication for years and never seen it like this before, wants to know is it safe to take.   The patient spoke with the pharmacy they advised to preserve the pill.   please advise

## 2019-12-17 NOTE — Telephone Encounter (Signed)
Advised per Dr. Quay Burow that medication was ok to take. It was the same medication. Pt expressed understanding.

## 2019-12-22 ENCOUNTER — Ambulatory Visit (INDEPENDENT_AMBULATORY_CARE_PROVIDER_SITE_OTHER): Payer: Medicare Other | Admitting: Cardiology

## 2019-12-22 ENCOUNTER — Encounter: Payer: Self-pay | Admitting: Cardiology

## 2019-12-22 ENCOUNTER — Other Ambulatory Visit: Payer: Self-pay

## 2019-12-22 VITALS — BP 160/100 | HR 67 | Temp 98.1°F | Ht 66.0 in | Wt 125.6 lb

## 2019-12-22 DIAGNOSIS — I10 Essential (primary) hypertension: Secondary | ICD-10-CM

## 2019-12-22 DIAGNOSIS — F17200 Nicotine dependence, unspecified, uncomplicated: Secondary | ICD-10-CM | POA: Diagnosis not present

## 2019-12-22 DIAGNOSIS — E7800 Pure hypercholesterolemia, unspecified: Secondary | ICD-10-CM | POA: Diagnosis not present

## 2019-12-22 DIAGNOSIS — I4821 Permanent atrial fibrillation: Secondary | ICD-10-CM

## 2019-12-22 MED ORDER — AMLODIPINE BESYLATE 5 MG PO TABS: 5.0000 mg | ORAL_TABLET | Freq: Every day | ORAL | 3 refills | Status: DC

## 2019-12-22 NOTE — Patient Instructions (Signed)
Medication Instructions:  START AMLODIPINE 5MG  DAILY.   *If you need a refill on your cardiac medications before your next appointment, please call your pharmacy*   Lab Work: NONE. If you have labs (blood work) drawn today and your tests are completely normal, you will receive your results only by: Marland Kitchen MyChart Message (if you have MyChart) OR . A paper copy in the mail If you have any lab test that is abnormal or we need to change your treatment, we will call you to review the results.   Follow-Up: At [facility], you and your health needs are our priority.  As part of our continuing mission to provide you with exceptional heart care, we have created designated Provider Care Teams.  These Care Teams include your primary Cardiologist (physician) and Advanced Practice Providers (APPs -  Physician Assistants and Nurse Practitioners) who all work together to provide you with the care you need, when you need it.  We recommend signing up for the patient portal called "MyChart".  Sign up information is provided on this After Visit Summary.  MyChart is used to connect with patients for Virtual Visits (Telemedicine).  Patients are able to view lab/test results, encounter notes, upcoming appointments, etc.  Non-urgent messages can be sent to your provider as well.   To learn more about what you can do with MyChart, go to [email or web address].    Your next appointment:   1 year(s)  The format for your next appointment:   In Person  Provider:   Kirk Ruths, MD

## 2019-12-29 ENCOUNTER — Ambulatory Visit (INDEPENDENT_AMBULATORY_CARE_PROVIDER_SITE_OTHER): Payer: Medicare Other

## 2019-12-29 ENCOUNTER — Other Ambulatory Visit: Payer: Self-pay

## 2019-12-29 ENCOUNTER — Encounter: Payer: Self-pay | Admitting: Pulmonary Disease

## 2019-12-29 ENCOUNTER — Ambulatory Visit (INDEPENDENT_AMBULATORY_CARE_PROVIDER_SITE_OTHER): Payer: Medicare Other | Admitting: Pulmonary Disease

## 2019-12-29 VITALS — BP 126/80 | HR 80 | Temp 98.2°F | Ht 66.0 in | Wt 125.0 lb

## 2019-12-29 DIAGNOSIS — R0602 Shortness of breath: Secondary | ICD-10-CM | POA: Diagnosis not present

## 2019-12-29 DIAGNOSIS — J449 Chronic obstructive pulmonary disease, unspecified: Secondary | ICD-10-CM

## 2019-12-29 NOTE — Progress Notes (Signed)
[name]    [id number]    07-27-1934  Primary Care Physician:Burns, Claudina Lick, MD  Referring Physician: Binnie Rail, MD [place],  [place] [address]  Chief complaint: Evaluation for COPD  HPI: 86 year old smoker with history of hypertension, hyperlipidemia.    Complains of mild dyspnea on exertion, chest congestion early in the morning. Given a sample of Anoro inhaler but she cannot use it due to lack of dexterity and also cost of medication.  Cannot tell if it is helping with her breathing Referred to pulmonary for further evaluation and management  Pets: No pets Occupation: Used to work in Sales executive for [employer] Exposures: No known exposures.  No mold, hot tub, Jacuzzi Smoking history: 20-30-pack-year smoker.  Still smokes 5 cigarettes a day Travel history: Originally from [place].  No significant recent travel Relevant family history: No significant family history of lung disease  Outpatient Encounter Medications as of 12/29/2019  Medication Sig  . amLODipine (NORVASC) 5 MG tablet Take 1 tablet (5 mg total) by mouth daily.  Marland Kitchen apixaban (ELIQUIS) 2.5 MG TABS tablet Take 1 tablet (2.5 mg total) by mouth 2 (two) times daily.  Marland Kitchen atorvastatin (LIPITOR) 20 MG tablet TAKE 1 TABLET BY MOUTH DAILY AT 6PM  . brimonidine (ALPHAGAN) 0.2 % ophthalmic solution Place 1 drop into the left eye 2 times daily.  . dorzolamide-timolol (COSOPT) 22.3-6.8 MG/ML ophthalmic solution Place 1 drop into both eyes 2 times daily.  Marland Kitchen gabapentin (NEURONTIN) 300 MG capsule TAKE 1 CAPSULE BY MOUTH THREE TIMES A DAY  . KLOR-CON M10 10 MEQ tablet TAKE 1 TABLET BY MOUTH EVERY DAY  . latanoprost (XALATAN) 0.005 % ophthalmic solution Place 1 drop into both eyes nightly.  . metoprolol tartrate (LOPRESSOR) 50 MG tablet TAKE 1 TABLET BY MOUTH TWICE A DAY  . pilocarpine (PILOCAR) 4 % ophthalmic solution Place 1 drop into the left eye 3 times daily.  Marland Kitchen tiotropium (SPIRIVA  HANDIHALER) 18 MCG inhalation capsule Place 1 capsule (18 mcg total) into inhaler and inhale daily.  . [DISCONTINUED] potassium chloride (K-DUR) 10 MEQ tablet Take 1 tablet (10 mEq total) by mouth daily.   No facility-administered encounter medications on file as of 12/29/2019.    Allergies as of 12/29/2019  . (No Known Allergies)    Past Medical History:  Diagnosis Date  . Anxiety   . Atrial fibrillation ([place])   . Cigarette smoker   . Depression   . Diverticulosis of colon   . DJD (degenerative joint disease)   . Family history of colon cancer   . Glaucoma   . Hemorrhoids   . History of sebaceous cyst    R posterior scalp - observation elected 01/2015 after surgical eval  . Hyperlipidemia   . Hypertension   . Osteopenia   . Renal cyst   . Restless leg syndrome   . Vitamin D deficiency     Past Surgical History:  Procedure Laterality Date  . CARDIOVERSION  04/23/2012   Procedure: CARDIOVERSION;  Surgeon: Hillary Bow, MD;  Location: [facility] ENDOSCOPY;  Service: Cardiovascular;  Laterality: N/A;  . CATARACT EXTRACTION    . COLONOSCOPY    . retina surgery x 2  2013  . TOTAL HIP ARTHROPLASTY  2/05   right by Dr. Tonita Cong  . UPPER GASTROINTESTINAL ENDOSCOPY      Family History  Problem Relation Age of Onset  . Hypertension Mother   . Diabetes  Mother   . Colon cancer Brother   . Prostate cancer Father   . Breast cancer Other        niece  . Diabetes Brother   . Diabetes Sister     Social History   Socioeconomic History  . Marital status: Widowed    Spouse name: Not on file  . Number of children: 0  . Years of education: Not on file  . Highest education level: Not on file  Occupational History  . Occupation: Retired  Tobacco Use  . Smoking status: Light Tobacco Smoker    Packs/day: 0.30  . Smokeless tobacco: Never Used  . Tobacco comment: 4-5 cig a day  Substance and Sexual Activity  . Alcohol use: No    Alcohol/week: 0.0 standard drinks  . Drug use: No  .  Sexual activity: Not Currently  Other Topics Concern  . Not on file  Social History Narrative   Exercises 2 times per week   Caffeine use: 1 cup coffee per day   Lives alone, widowed 1978   Social Determinants of Health   Financial Resource Strain:   . Difficulty of Paying Living Expenses:   Food Insecurity:   . Worried About Charity fundraiser in the Last Year:   . Arboriculturist in the Last Year:   Transportation Needs:   . Film/video editor (Medical):   Marland Kitchen Lack of Transportation (Non-Medical):   Physical Activity:   . Days of Exercise per Week:   . Minutes of Exercise per Session:   Stress:   . Feeling of Stress :   Social Connections:   . Frequency of Communication with Friends and Family:   . Frequency of Social Gatherings with Friends and Family:   . Attends Religious Services:   . Active Member of Clubs or Organizations:   . Attends Archivist Meetings:   Marland Kitchen Marital Status:   Intimate Partner Violence:   . Fear of Current or Ex-Partner:   . Emotionally Abused:   Marland Kitchen Physically Abused:   . Sexually Abused:     Review of systems: Review of Systems  Constitutional: Negative for fever and chills.  HENT: Negative.   Eyes: Negative for blurred vision.  Respiratory: as per HPI  Cardiovascular: Negative for chest pain and palpitations.  Gastrointestinal: Negative for vomiting, diarrhea, blood per rectum. Genitourinary: Negative for dysuria, urgency, frequency and hematuria.  Musculoskeletal: Negative for myalgias, back pain and joint pain.  Skin: Negative for itching and rash.  Neurological: Negative for dizziness, tremors, focal weakness, seizures and loss of consciousness.  Endo/Heme/Allergies: Negative for environmental allergies.  Psychiatric/Behavioral: Negative for depression, suicidal ideas and hallucinations.  All other systems reviewed and are negative.  Physical Exam: Blood pressure 126/80, pulse 80, temperature 98.2 F (36.8 C),  temperature source Temporal, height 5\' 6"  (1.676 m), weight 125 lb (56.7 kg), SpO2 98 %. Gen:      No acute distress HEENT:  EOMI, sclera anicteric Neck:     No masses; no thyromegaly Lungs:    Clear to auscultation bilaterally; normal respiratory effort CV:         Regular rate and rhythm; no murmurs Abd:      + bowel sounds; soft, non-tender; no palpable masses, no distension Ext:    No edema; adequate peripheral perfusion Skin:      Warm and dry; no rash Neuro: alert and oriented x 3 Psych: normal mood and affect  Data Reviewed: Imaging: Chest x-ray 12/24/2013-no  acute cardiopulmonary disease.  I have reviewed the images personally.  Labs: CBC 11/23/2019-WBC 3.7, eos 2.4%, absolute eosinophil count 89  Assessment:  COPD She has mild symptoms am not sure if she needs a regular controller medication as she has issues with cost and inhaler technique due to dexterity. We will schedule CXR and pulmonary function test for better evaluation . If there is significant obstruction then we can retry inhaler therapy with LABA/LAMA with assistance of our pharmacist to help with technique and cost of medication.  Plan/Recommendations: Schedule PFTs, chest x-ray Return to clinic in 1 to 2 months.  Marshell Garfinkel MD [facility] 12/29/2019, 11:47 AM  CC: Binnie Rail, MD

## 2019-12-29 NOTE — Patient Instructions (Signed)
We will schedule pulmonary function test and chest x-ray for better evaluation of your lungs Based on these results we will determine if you need to be on an inhaler Schedule follow-up co- visit along with pharmacist in 1 to 2 months.

## 2020-01-04 ENCOUNTER — Other Ambulatory Visit: Payer: Self-pay | Admitting: Cardiology

## 2020-01-18 ENCOUNTER — Telehealth: Payer: Self-pay | Admitting: Internal Medicine

## 2020-01-18 DIAGNOSIS — I4821 Permanent atrial fibrillation: Secondary | ICD-10-CM

## 2020-01-18 NOTE — Progress Notes (Signed)
  Chronic Care Management   Note  01/18/2020 Name: KHAIYA MOBILIA MRN: [id number] DOB: Nov 14, 1933  [name] is a 86 y.o. year old female who is a primary care patient of Burns, Claudina Lick, MD. I reached out to [name] by phone today in response to a referral sent by Ms. Carmie Kanner PCP, Binnie Rail, MD.   Ms. Swartzfager was given information about Chronic Care Management services today including:  1. CCM service includes personalized support from designated clinical staff supervised by her physician, including individualized plan of care and coordination with other care providers 2. 24/7 contact phone numbers for assistance for urgent and routine care needs. 3. Service will only be billed when office clinical staff spend 20 minutes or more in a month to coordinate care. 4. Only one practitioner may furnish and bill the service in a calendar month. 5. The patient may stop CCM services at any time (effective at the end of the month) by phone call to the office staff.   Patient agreed to services and verbal consent obtained.   This note is not being shared with the patient for the following reason: To respect privacy (The patient or proxy has requested that the information not be shared).  Follow up plan:   Earney [place] [facility] Scheduler

## 2020-01-25 ENCOUNTER — Other Ambulatory Visit: Payer: Self-pay | Admitting: Internal Medicine

## 2020-02-08 ENCOUNTER — Other Ambulatory Visit (HOSPITAL_COMMUNITY)
Admission: RE | Admit: 2020-02-08 | Discharge: 2020-02-08 | Disposition: A | Discharge status: Home / Self Care | Payer: Medicare Other | Source: Ambulatory Visit | Attending: Pulmonary Disease | Admitting: Pulmonary Disease

## 2020-02-08 DIAGNOSIS — Z20822 Contact with and (suspected) exposure to covid-19: Secondary | ICD-10-CM | POA: Insufficient documentation

## 2020-02-08 DIAGNOSIS — Z01812 Encounter for preprocedural laboratory examination: Secondary | ICD-10-CM | POA: Diagnosis present

## 2020-02-08 LAB — SARS CORONAVIRUS 2 (TAT 6-24 HRS): SARS Coronavirus 2: NEGATIVE

## 2020-02-11 ENCOUNTER — Other Ambulatory Visit: Payer: Self-pay

## 2020-02-11 ENCOUNTER — Telehealth: Payer: Self-pay | Admitting: Pulmonary Disease

## 2020-02-11 ENCOUNTER — Ambulatory Visit (INDEPENDENT_AMBULATORY_CARE_PROVIDER_SITE_OTHER): Payer: Medicare Other | Admitting: Pulmonary Disease

## 2020-02-11 ENCOUNTER — Other Ambulatory Visit: Payer: Medicare Other

## 2020-02-11 DIAGNOSIS — J449 Chronic obstructive pulmonary disease, unspecified: Secondary | ICD-10-CM | POA: Diagnosis not present

## 2020-02-11 LAB — PULMONARY FUNCTION TEST
DL/VA % pred: 105 %
DL/VA: 4.19 ml/min/mmHg/L
DLCO cor % pred: 78 %
DLCO cor: 15.47 ml/min/mmHg
DLCO unc % pred: 78 %
DLCO unc: 15.47 ml/min/mmHg
FEF 25-75 Post: 0.7 L/sec
FEF 25-75 Pre: 0.79 L/sec
FEF2575-%Change-Post: -11 %
FEF2575-%Pred-Post: 53 %
FEF2575-%Pred-Pre: 60 %
FEV1-%Change-Post: -3 %
FEV1-%Pred-Post: 80 %
FEV1-%Pred-Pre: 84 %
FEV1-Post: 1.32 L
FEV1-Pre: 1.37 L
FEV1FVC-%Change-Post: 1 %
FEV1FVC-%Pred-Pre: 87 %
FEV6-%Change-Post: -12 %
FEV6-%Pred-Post: 90 %
FEV6-%Pred-Pre: 104 %
FEV6-Post: 1.83 L
FEV6-Pre: 2.09 L
FEV6FVC-%Change-Post: 0 %
FEV6FVC-%Pred-Post: 104 %
FEV6FVC-%Pred-Pre: 104 %
FVC-%Change-Post: -5 %
FVC-%Pred-Post: 94 %
FVC-%Pred-Pre: 99 %
FVC-Post: 1.98 L
FVC-Pre: 2.09 L
Post FEV1/FVC ratio: 67 %
Post FEV6/FVC ratio: 100 %
Pre FEV1/FVC ratio: 65 %
Pre FEV6/FVC Ratio: 100 %
RV % pred: 132 %
RV: 3.4 L
TLC % pred: 99 %
TLC: 5.32 L

## 2020-02-11 NOTE — Progress Notes (Signed)
Full PFT performed today. °

## 2020-02-11 NOTE — Telephone Encounter (Signed)
Dr. Vaughan Browner, please advise on this.

## 2020-02-12 NOTE — Telephone Encounter (Signed)
PFTs shows mild COPD. We will discuss at time of clinic visit. Nothing further needed

## 2020-02-12 NOTE — Telephone Encounter (Signed)
Called and spoke with pt letting her know the results of the PFT stated by Dr. Vaughan Browner and she verbalized understanding. Nothing further needed.

## 2020-02-28 ENCOUNTER — Other Ambulatory Visit: Payer: Self-pay | Admitting: Cardiology

## 2020-03-14 DIAGNOSIS — H401113 Primary open-angle glaucoma, right eye, severe stage: Secondary | ICD-10-CM | POA: Diagnosis not present

## 2020-03-14 DIAGNOSIS — H401122 Primary open-angle glaucoma, left eye, moderate stage: Secondary | ICD-10-CM | POA: Diagnosis not present

## 2020-03-24 ENCOUNTER — Ambulatory Visit (INDEPENDENT_AMBULATORY_CARE_PROVIDER_SITE_OTHER): Payer: Medicare Other | Admitting: Pulmonary Disease

## 2020-03-24 ENCOUNTER — Encounter: Payer: Self-pay | Admitting: Pulmonary Disease

## 2020-03-24 ENCOUNTER — Other Ambulatory Visit: Payer: Self-pay

## 2020-03-24 VITALS — BP 124/78 | HR 90 | Temp 98.0°F | Ht 66.0 in | Wt 125.0 lb

## 2020-03-24 DIAGNOSIS — J449 Chronic obstructive pulmonary disease, unspecified: Secondary | ICD-10-CM

## 2020-03-24 NOTE — Progress Notes (Signed)
[name]    [id number]    [birth year]  Primary Care Physician:Burns, Claudina Lick, MD  Referring Physician: Binnie Rail, MD [place],  [place] [address]  Chief complaint: Follow-up for COPD  HPI: 86 year old smoker with history of hypertension, hyperlipidemia.    Complains of mild dyspnea on exertion, chest congestion early in the morning. Given a sample of Anoro inhaler but she cannot use it due to lack of dexterity and also cost of medication.  Cannot tell if it is helping with her breathing Referred to pulmonary for further evaluation and management  Pets: No pets Occupation: Used to work in Sales executive for [employer] Exposures: No known exposures.  No mold, hot tub, Jacuzzi Smoking history: 20-30-pack-year smoker.  Still smokes 5 cigarettes a day Travel history: Originally from [place].  No significant recent travel Relevant family history: No significant family history of lung disease  Outpatient Encounter Medications as of 03/24/2020  Medication Sig  . amLODipine (NORVASC) 5 MG tablet Take 1 tablet (5 mg total) by mouth daily.  Marland Kitchen atorvastatin (LIPITOR) 20 MG tablet TAKE 1 TABLET BY MOUTH DAILY AT 6PM  . brimonidine (ALPHAGAN) 0.2 % ophthalmic solution Place 1 drop into the left eye 2 times daily.  . dorzolamide-timolol (COSOPT) 22.3-6.8 MG/ML ophthalmic solution Place 1 drop into both eyes 2 times daily.  Marland Kitchen ELIQUIS 2.5 MG TABS tablet TAKE 1 TABLET BY MOUTH TWICE A DAY  . gabapentin (NEURONTIN) 300 MG capsule TAKE 1 CAPSULE BY MOUTH THREE TIMES A DAY  . KLOR-CON M10 10 MEQ tablet TAKE 1 TABLET BY MOUTH EVERY DAY  . latanoprost (XALATAN) 0.005 % ophthalmic solution Place 1 drop into both eyes nightly.  . metoprolol tartrate (LOPRESSOR) 50 MG tablet TAKE 1 TABLET BY MOUTH TWICE A DAY  . pilocarpine (PILOCAR) 4 % ophthalmic solution Place 1 drop into the left eye 3 times daily.  Marland Kitchen tiotropium (SPIRIVA HANDIHALER) 18 MCG inhalation  capsule Place 1 capsule (18 mcg total) into inhaler and inhale daily. (Patient not taking: Reported on 03/24/2020)  . [DISCONTINUED] potassium chloride (K-DUR) 10 MEQ tablet Take 1 tablet (10 mEq total) by mouth daily.   No facility-administered encounter medications on file as of 03/24/2020.    Allergies as of 03/24/2020  . (No Known Allergies)    Past Medical History:  Diagnosis Date  . Anxiety   . Atrial fibrillation ([place])   . Cigarette smoker   . Depression   . Diverticulosis of colon   . DJD (degenerative joint disease)   . Family history of colon cancer   . Glaucoma   . Hemorrhoids   . History of sebaceous cyst    R posterior scalp - observation elected 01/2015 after surgical eval  . Hyperlipidemia   . Hypertension   . Osteopenia   . Renal cyst   . Restless leg syndrome   . Vitamin D deficiency     Past Surgical History:  Procedure Laterality Date  . CARDIOVERSION  04/23/2012   Procedure: CARDIOVERSION;  Surgeon: Hillary Bow, MD;  Location: [facility] ENDOSCOPY;  Service: Cardiovascular;  Laterality: N/A;  . CATARACT EXTRACTION    . COLONOSCOPY    . retina surgery x 2  2013  . TOTAL HIP ARTHROPLASTY  2/05   right by Dr. Tonita Cong  . UPPER GASTROINTESTINAL ENDOSCOPY      Family History  Problem Relation Age of Onset  . Hypertension Mother   .  Diabetes Mother   . Colon cancer Brother   . Prostate cancer Father   . Breast cancer Other        niece  . Diabetes Brother   . Diabetes Sister     Social History   Socioeconomic History  . Marital status: Widowed    Spouse name: Not on file  . Number of children: 0  . Years of education: Not on file  . Highest education level: Not on file  Occupational History  . Occupation: Retired  Tobacco Use  . Smoking status: Light Tobacco Smoker    Packs/day: 0.30  . Smokeless tobacco: Never Used  . Tobacco comment: 4-5 cig a day  Substance and Sexual Activity  . Alcohol use: No    Alcohol/week: 0.0 standard drinks  .  Drug use: No  . Sexual activity: Not Currently  Other Topics Concern  . Not on file  Social History Narrative   Exercises 2 times per week   Caffeine use: 1 cup coffee per day   Lives alone, widowed 1978   Social Determinants of Health   Financial Resource Strain:   . Difficulty of Paying Living Expenses:   Food Insecurity:   . Worried About Charity fundraiser in the Last Year:   . Arboriculturist in the Last Year:   Transportation Needs:   . Film/video editor (Medical):   Marland Kitchen Lack of Transportation (Non-Medical):   Physical Activity:   . Days of Exercise per Week:   . Minutes of Exercise per Session:   Stress:   . Feeling of Stress :   Social Connections:   . Frequency of Communication with Friends and Family:   . Frequency of Social Gatherings with Friends and Family:   . Attends Religious Services:   . Active Member of Clubs or Organizations:   . Attends Archivist Meetings:   Marland Kitchen Marital Status:   Intimate Partner Violence:   . Fear of Current or Ex-Partner:   . Emotionally Abused:   Marland Kitchen Physically Abused:   . Sexually Abused:     Review of systems: Review of Systems  Constitutional: Negative for fever and chills.  HENT: Negative.   Eyes: Negative for blurred vision.  Respiratory: as per HPI  Cardiovascular: Negative for chest pain and palpitations.  Gastrointestinal: Negative for vomiting, diarrhea, blood per rectum. Genitourinary: Negative for dysuria, urgency, frequency and hematuria.  Musculoskeletal: Negative for myalgias, back pain and joint pain.  Skin: Negative for itching and rash.  Neurological: Negative for dizziness, tremors, focal weakness, seizures and loss of consciousness.  Endo/Heme/Allergies: Negative for environmental allergies.  Psychiatric/Behavioral: Negative for depression, suicidal ideas and hallucinations.  All other systems reviewed and are negative.  Physical Exam: Blood pressure 126/80, pulse 80, temperature 98.2 F  (36.8 C), temperature source Temporal, height 5\' 6"  (1.676 m), weight 125 lb (56.7 kg), SpO2 98 %. Gen:      No acute distress HEENT:  EOMI, sclera anicteric Neck:     No masses; no thyromegaly Lungs:    Clear to auscultation bilaterally; normal respiratory effort CV:         Regular rate and rhythm; no murmurs Abd:      + bowel sounds; soft, non-tender; no palpable masses, no distension Ext:    No edema; adequate peripheral perfusion Skin:      Warm and dry; no rash Neuro: alert and oriented x 3 Psych: normal mood and affect  Data Reviewed: Imaging: Chest x-ray  12/24/2013-no acute cardiopulmonary disease.  I have reviewed the images personally.  PFTs:  02/11/2020 FVC 1.98 [94%], FEV1 1.32 [80%], F/F 67, TLC 5.32 [9 9%], DLCO 15.47 [78%] Mild obstructive airway disease, minimal diffusion defect  Labs: CBC 11/23/2019-WBC 3.7, eos 2.4%, absolute eosinophil count 89  Assessment:  COPD She has mild symptoms am not sure if she needs a regular controller medication as she has issues with cost and inhaler technique due to dexterity.  Discussed with patient in clinic today. Will reevaluate in 1 year  Plan/Recommendations: Observe off inhalers Reevaluate in 1 year  Marshell Garfinkel MD [facility] 03/24/2020, 11:32 AM  CC: Binnie Rail, MD

## 2020-03-24 NOTE — Patient Instructions (Signed)
I have reviewed your PFTs which show mild COPD Since your breathing is doing okay we will observe you off inhalers  Follow-up in 1 year

## 2020-03-30 ENCOUNTER — Other Ambulatory Visit: Payer: Self-pay | Admitting: Cardiology

## 2020-03-30 NOTE — Chronic Care Management (AMB) (Signed)
Chronic Care Management Pharmacy  Name: [name]  MRN: [id number] DOB: [birth year]   Chief Complaint/ HPI  [name],  86 y.o. , female presents for their Initial CCM visit with the clinical pharmacist via telephone due to COVID-19 Pandemic.  PCP : Binnie Rail, MD Patient Care Team: Binnie Rail, MD as PCP - General (Internal Medicine) Stanford Breed Denice Bors, MD as PCP - Cardiology (Cardiology) Lafayette Dragon, MD (Inactive) (Gastroenterology) Stanford Breed Denice Bors, MD (Cardiology) Susa Day, MD (Orthopedic Surgery) Bond, Tracie Harrier, MD (Ophthalmology) Erroll Luna, MD (General Surgery) Charlton Haws, [facility] as Pharmacist (Pharmacist)  Their chronic conditions include: Hypertension, Hyperlipidemia, Atrial Fibrillation, COPD, Osteopenia, Osteoarthritis, Tobacco use and IBS, Neuropathy, RLS, Glaucoma  Ms Rampy is originally from [place] but spent most of her life in [place]. Patient lives alone, she gets help from neighbor/caregiver who drives her to doctors visits, picks up medications for her, and cooks some meals for her. She has limited mobility, uses a can to get around, and spends most of her time doing household chores and watching TV.  Office Visits: 11/23/19 Dr Quay Burow OV: DOE is stable, still smoking, not ready to quit. Trial Anoro samples.  Consult Visit: 03/24/20 Dr Vaughan Browner (pulmonary): cannot use Ellipta d/t lack of dexterity, cost. PFTs - mild COPD, Mild sx, not sure she needs regular controller inhaler. Plan to observe off inhalers.  03/14/20 Dr Edilia Bo (eye): f/u for glaucoma.  12/22/19 Dr Stanford Breed (cardiology): added amlodipine 5 mg for elevated BP in clinic.  No Known Allergies  Medications: Outpatient Encounter Medications as of 03/31/2020  Medication Sig  . amLODipine (NORVASC) 5 MG tablet Take 1 tablet (5 mg total) by mouth daily.  Marland Kitchen atorvastatin (LIPITOR) 20 MG tablet TAKE 1 TABLET BY MOUTH DAILY AT 6PM  . brimonidine (ALPHAGAN)  0.2 % ophthalmic solution Place 1 drop into the left eye 2 times daily.  . Calcium Carb-Cholecalciferol (CALCIUM-VITAMIN D3) 600-400 MG-UNIT CAPS Take 1 tablet by mouth every other day.  . dorzolamide-timolol (COSOPT) 22.3-6.8 MG/ML ophthalmic solution Place 1 drop into both eyes 2 times daily.  Marland Kitchen ELIQUIS 2.5 MG TABS tablet TAKE 1 TABLET BY MOUTH TWICE A DAY  . gabapentin (NEURONTIN) 300 MG capsule TAKE 1 CAPSULE BY MOUTH THREE TIMES A DAY  . latanoprost (XALATAN) 0.005 % ophthalmic solution Place 1 drop into both eyes nightly.  . metoprolol tartrate (LOPRESSOR) 50 MG tablet TAKE 1 TABLET BY MOUTH TWICE A DAY  . pilocarpine (PILOCAR) 4 % ophthalmic solution Place 1 drop into the left eye 3 times daily.  . Vitamin D, Cholecalciferol, 50 MCG (2000 UT) CAPS Take 1 tablet by mouth every other day.  Marland Kitchen KLOR-CON M10 10 MEQ tablet TAKE 1 TABLET BY MOUTH EVERY DAY (Patient not taking: Reported on 03/31/2020)  . tiotropium (SPIRIVA HANDIHALER) 18 MCG inhalation capsule Place 1 capsule (18 mcg total) into inhaler and inhale daily. (Patient not taking: Reported on 03/24/2020)  . [DISCONTINUED] potassium chloride (K-DUR) 10 MEQ tablet Take 1 tablet (10 mEq total) by mouth daily.   No facility-administered encounter medications on file as of 03/31/2020.     Current Diagnosis/Assessment:  SDOH Interventions     Most Recent Value  SDOH Interventions  Financial Strain Interventions Intervention Not Indicated      Goals Addressed            This Visit's Progress   . Pharmacy Care Plan       CARE PLAN ENTRY (see longitudinal plan  of care for additional care plan information)  Current Barriers:  . Chronic Disease Management support, education, and care coordination needs related to Hypertension, Hyperlipidemia, and Atrial Fibrillation   Hypertension BP Readings from Last 3 Encounters:  03/24/20 124/78  12/29/19 126/80  12/22/19 (!) 160/100 .  Pharmacist Clinical Goal(s): o Over the next 180 days,  patient will work with PharmD and providers to maintain BP goal <130/80 . Current regimen:  . Amlodipine 5 mg daily . Metoprolol tartrate 50 mg daily . Interventions: o Discussed BP goals and benefits of medication for prevention of heart attack / stroke . Patient self care activities - Over the next 180 days, patient will: o Check BP as needed, document, and provide at future appointments o Ensure daily salt intake < 2300 mg/day  Hyperlipidemia Lab Results  Component Value Date/Time   LDLCALC 74 11/23/2019 11:37 AM .  Pharmacist Clinical Goal(s): o Over the next 180 days, patient will work with PharmD and providers to maintain LDL goal < 100 . Current regimen:  o Atorvastatin 20 mg daily . Interventions: o Discussed cholesterol goals and benefits of medication for prevention of heart attack / stroke . Patient self care activities - Over the next 180 days, patient will: o Continue medication as prescribed  Atrial Fibrillation . Pharmacist Clinical Goal(s) o Over the next 180 days, patient will work with PharmD and providers to optimize therapy . Current regimen:  o Metoprolol tartrate 50 mg BID o Eliquis 2.5 mg BID . Interventions: o Discussed common side effects of Eliquis: bleeding and bruising . Patient self care activities - Over the next 180 days, patient will: o Monitor for signs of bleeding o Avoid Advil, Motrin, Aleve due to bleeding risk  Medication management . Pharmacist Clinical Goal(s): o Over the next 180 days, patient will work with PharmD and providers to maintain optimal medication adherence . [facility] . Interventions o Comprehensive medication review performed. o Continue current medication management strategy . Patient self care activities - Over the next 180 days, patient will: o Focus on medication adherence by fill date o Take medications as prescribed o Report any questions or concerns to PharmD and/or provider(s)  Initial goal  documentation       Hypertension   BP goal is:  <130/80  Office blood pressures are  BP Readings from Last 3 Encounters:  03/24/20 124/78  12/29/19 126/80  12/22/19 (!) 160/100   Patient checks BP at home several times per month Patient home BP readings are ranging: 120s/80s  Patient has failed these meds in the past: n/a Patient is currently controlled on the following medications:  . Amlodipine 5 mg daily . Metoprolol tartrate 50 mg daily  We discussed BP goal, benefits of medications; patient denies side effects or issues; she does not she is unsteady on her feets, moves slowly and uses cane to try to prevent falls.  Plan  Continue current medications   AFIB   Patient is currently rate controlled. Office heart rates are  Pulse Readings from Last 3 Encounters:  03/24/20 90  12/29/19 80  12/22/19 67   CHA2DS2-VASc Score = 3  The patient's score is based upon: CHF History: 0 HTN History: 1 Age : 2 Diabetes History: 0 Stroke History: 0 Vascular Disease History: 0 Gender: 0  Patient has failed these meds in past: n/a Patient is currently controlled on the following medications:  Marland Kitchen Metoprolol tartrate 50 mg BID . Eliquis 2.5 mg BID  We discussed: purpose of  medications; bleeding risk with Eliquis; pt denies bleeding/bruising.  Plan  Continue current medications  Hyperlipidemia   LDL goal < 100  Lipid Panel     Component Value Date/Time   CHOL 145 11/23/2019 1137   TRIG 56.0 11/23/2019 1137   HDL 59.70 11/23/2019 1137   LDLCALC 74 11/23/2019 1137    Hepatic Function Latest Ref Rng & Units 11/23/2019 03/24/2019 09/23/2018  Total Protein 6.0 - 8.3 g/dL 6.5 6.9 6.4  Albumin 3.5 - 5.2 g/dL 4.0 4.2 4.1  AST 0 - 37 U/L _0 ALT 0 - 35 U/L _1 Alk Phosphatase 39 - 117 U/L 69 68 75  Total Bilirubin 0.2 - 1.2 mg/dL 0.8 0.7 0.8  Bilirubin, Direct 0.0 - 0.3 mg/dL - - -     The ASCVD Risk score (Badger Lee., et al., 2013) failed to calculate  for the following reasons:   The 2013 ASCVD risk score is only valid for ages 51 to 95   Patient has failed these meds in past: n/a Patient is currently controlled on the following medications:  . Atorvastatin 20 mg daily  We discussed:  Cholesterol goals; benefits of statin for ASCVD risk reduction  Plan  Continue current medications  COPD   Last spirometry score: n/a  Lab Results  Component Value Date/Time   EOSPCT 2.4 11/23/2019 11:37 AM   EOSABS 0.1 11/23/2019 11:37 AM   Patient has failed these meds in past: Spiriva, Anoro  Patient is currently controlled on the following medications:  . No medications  Using maintenance inhaler regularly? No Frequency of rescue inhaler use:  never  We discussed: Per pulmonary patient is stable off of inhalers; planning to monitor  Plan  Continue to monitor  Tobacco Abuse   Tobacco Status:  Social History   Tobacco Use  Smoking Status Light Tobacco Smoker  . Packs/day: 0.30  Smokeless Tobacco Never Used  Tobacco Comment   4-5 cig a day   Previous quit attempts included: n/a Patient is currently uncontrolled on the following medications:  . No medication  Plan  Continue to assess readiness to quit  Neuropathy / RLS   Patient has failed these meds in past: n/a Patient is currently controlled on the following medications:  . Gabapentin 300 mg TID  We discussed:  Pt reports limited improvement since starting gabapentin; Hands/knees/fingers ache at night; She reports she typically takes gabapentin 1-2 times per day; Encouraged pt to take gabapentin every night at bedtime  Plan  Continue current medications  Glaucoma   Patient has failed these meds in past: n/a Patient is currently controlled on the following medications:  . Brimonidine (Alphagan) 0.2% eye drops - L eye TID . Dorzolamide-timolol (Cosopt) - both eyes BID . Latanoprost (Xalatan) 0.005% eye drops - both eyes HS . Pilocarpine (Pilocar) 4% eye drops  - L eye TID  We discussed:  Pt reports adherence with eye drops as directed. She has regular f/u with ophthalmologist  Plan  Continue current medications  Osteopenia / Osteoporosis   Last DEXA Scan: 09/24/16  T-Score femoral neck: -1.9  T-Score total hip: n/a  T-Score lumbar spine: -1.3  T-Score forearm radius: n/a  10-year probability of major osteoporotic fracture: 6.3%  10-year probability of hip fracture: 2.7%  Vit D, 25-Hydroxy  Date Value Ref Range Status  06/22/2013 33 30 - 89 ng/mL Final    Comment:    This assay accurately quantifies Vitamin D, which is the sum of  the 25-Hydroxy forms of Vitamin D2 and D3.  Studies have shown that the optimum concentration of 25-Hydroxy Vitamin D is 30 ng/mL or higher.  Concentrations of Vitamin D between 20 and 29 ng/mL are considered to be insufficient and concentrations less than 20 ng/mL are considered to be deficient for Vitamin D.    Patient is not a candidate for pharmacologic treatment  Patient has failed these meds in past: n/a Patient is currently controlled on the following medications:  . Calcium 600 mg - Vitamin D3 - QOD . Vitamin D3 2000 IU - QOD  We discussed:  Recommend [phone] units of vitamin D daily. Recommend 1200 mg of calcium daily from dietary and supplemental sources.  Plan  Continue current medications  Health Maintenance   Lab Results  Component Value Date/Time   K 4.0 11/23/2019 11:37 AM  ; Patient is currently controlled on the following medications:  . Klor-Con 20 mEq daily - not taking  We discussed:  Pt reports one of her providers told to stop taking potassium. Most recent K levels were WNL.  Plan  Continue to monitor  Medication Management   [facility] Uses pill box? No - prefers bottles Pt endorses compliance  We discussed: [facility]ations [facility], pt's neighbor typically picks them up for her. Pt denies issues paying for medications.    Plan  Continue current medication management strategy    Follow up: 6 month phone visit  Charlene Brooke, PharmD, [facility] Clinical Pharmacist [facility] [phone]

## 2020-03-31 ENCOUNTER — Ambulatory Visit: Payer: Medicare Other | Admitting: Pharmacist

## 2020-03-31 ENCOUNTER — Other Ambulatory Visit: Payer: Self-pay

## 2020-03-31 DIAGNOSIS — I10 Essential (primary) hypertension: Secondary | ICD-10-CM

## 2020-03-31 DIAGNOSIS — E7800 Pure hypercholesterolemia, unspecified: Secondary | ICD-10-CM

## 2020-03-31 DIAGNOSIS — I4821 Permanent atrial fibrillation: Secondary | ICD-10-CM

## 2020-03-31 NOTE — Addendum Note (Signed)
Addended by: Karle Barr on: 03/31/2020 04:27 PM   Modules accepted: Orders

## 2020-03-31 NOTE — Patient Instructions (Addendum)
Visit Information  Phone number for Pharmacist: [phone]  Thank you for meeting with me to discuss your medications! I look forward to working with you to achieve your health care goals. Below is a summary of what we talked about during the visit:  Goals Addressed            This Visit's Progress   . Pharmacy Care Plan       CARE PLAN ENTRY (see longitudinal plan of care for additional care plan information)  Current Barriers:  . Chronic Disease Management support, education, and care coordination needs related to Hypertension, Hyperlipidemia, and Atrial Fibrillation   Hypertension BP Readings from Last 3 Encounters:  03/24/20 124/78  12/29/19 126/80  12/22/19 (!) 160/100 .  Pharmacist Clinical Goal(s): o Over the next 180 days, patient will work with PharmD and providers to maintain BP goal <130/80 . Current regimen:  . Amlodipine 5 mg daily . Metoprolol tartrate 50 mg daily . Interventions: o Discussed BP goals and benefits of medication for prevention of heart attack / stroke . Patient self care activities - Over the next 180 days, patient will: o Check BP as needed, document, and provide at future appointments o Ensure daily salt intake < 2300 mg/day  Hyperlipidemia Lab Results  Component Value Date/Time   LDLCALC 74 11/23/2019 11:37 AM .  Pharmacist Clinical Goal(s): o Over the next 180 days, patient will work with PharmD and providers to maintain LDL goal < 100 . Current regimen:  o Atorvastatin 20 mg daily . Interventions: o Discussed cholesterol goals and benefits of medication for prevention of heart attack / stroke . Patient self care activities - Over the next 180 days, patient will: o Continue medication as prescribed  Atrial Fibrillation . Pharmacist Clinical Goal(s) o Over the next 180 days, patient will work with PharmD and providers to optimize therapy . Current regimen:  o Metoprolol tartrate 50 mg BID o Eliquis 2.5 mg  BID . Interventions: o Discussed common side effects of Eliquis: bleeding and bruising . Patient self care activities - Over the next 180 days, patient will: o Monitor for signs of bleeding o Avoid Advil, Motrin, Aleve due to bleeding risk  Medication management . Pharmacist Clinical Goal(s): o Over the next 180 days, patient will work with PharmD and providers to maintain optimal medication adherence . [facility] . Interventions o Comprehensive medication review performed. o Continue current medication management strategy . Patient self care activities - Over the next 180 days, patient will: o Focus on medication adherence by fill date o Take medications as prescribed o Report any questions or concerns to PharmD and/or provider(s)  Initial goal documentation       [name] was given information about Chronic Care Management services today including:  1. CCM service includes personalized support from designated clinical staff supervised by her physician, including individualized plan of care and coordination with other care providers 2. 24/7 contact phone numbers for assistance for urgent and routine care needs. 3. Standard insurance, coinsurance, copays and deductibles apply for chronic care management only during months in which we provide at least 20 minutes of these services. Most insurances cover these services at 100%, however patients may be responsible for any copay, coinsurance and/or deductible if applicable. This service may help you avoid the need for more expensive face-to-face services. 4. Only one practitioner may furnish and bill the service in a calendar month. 5. The patient may stop CCM services at any time (effective at the  end of the month) by phone call to the office staff.  Patient agreed to services and verbal consent obtained.   Patient verbalizes understanding of instructions provided today.  Telephone follow up appointment with pharmacy team  member scheduled for: 6 months  Charlene Brooke, PharmD Clinical Pharmacist [facility] After Age 47 After age 48, you are at a higher risk for certain long-term diseases and infections as well as injuries from falls. Falls are a major cause of broken bones and head injuries in people who are older than age 24. Getting regular preventive care can help to keep you healthy and well. Preventive care includes getting regular testing and making lifestyle changes as recommended by your health care provider. Talk with your health care provider about:  Which screenings and tests you should have. A screening is a test that checks for a disease when you have no symptoms.  A diet and exercise plan that is right for you. What should I know about screenings and tests to prevent falls? Screening and testing are the best ways to find a health problem early. Early diagnosis and treatment give you the best chance of managing medical conditions that are common after age 58. Certain conditions and lifestyle choices may make you more likely to have a fall. Your health care provider may recommend:  Regular vision checks. Poor vision and conditions such as cataracts can make you more likely to have a fall. If you wear glasses, make sure to get your prescription updated if your vision changes.  Medicine review. Work with your health care provider to regularly review all of the medicines you are taking, including over-the-counter medicines. Ask your health care provider about any side effects that may make you more likely to have a fall. Tell your health care provider if any medicines that you take make you feel dizzy or sleepy.  Osteoporosis screening. Osteoporosis is a condition that causes the bones to get weaker. This can make the bones weak and cause them to break more easily.  Blood pressure screening. Blood pressure changes and medicines to control  blood pressure can make you feel dizzy.  Strength and balance checks. Your health care provider may recommend certain tests to check your strength and balance while standing, walking, or changing positions.  Foot health exam. Foot pain and numbness, as well as not wearing proper footwear, can make you more likely to have a fall.  Depression screening. You may be more likely to have a fall if you have a fear of falling, feel emotionally low, or feel unable to do activities that you used to do.  Alcohol use screening. Using too much alcohol can affect your balance and may make you more likely to have a fall. What actions can I take to lower my risk of falls? General instructions  Talk with your health care provider about your risks for falling. Tell your health care provider if: ? You fall. Be sure to tell your health care provider about all falls, even ones that seem minor. ? You feel dizzy, sleepy, or off-balance.  Take over-the-counter and prescription medicines only as told by your health care provider. These include any supplements.  Eat a healthy diet and maintain a healthy weight. A healthy diet includes low-fat dairy products, low-fat (lean) meats, and fiber from whole grains, beans, and lots of fruits and vegetables. Home safety  Remove any tripping hazards, such as rugs, cords, and clutter.  Install  safety equipment such as grab bars in bathrooms and safety rails on stairs.  Keep rooms and walkways well-lit. Activity   Follow a regular exercise program to stay fit. This will help you maintain your balance. Ask your health care provider what types of exercise are appropriate for you.  If you need a cane or walker, use it as recommended by your health care provider.  Wear supportive shoes that have nonskid soles. Lifestyle  Do not drink alcohol if your health care provider tells you not to drink.  If you drink alcohol, limit how much you have: ? 0-1 drink a day for  women. ? 0-2 drinks a day for men.  Be aware of how much alcohol is in your drink. In the [place] one drink equals one typical bottle of beer (12 oz), one-half glass of wine (5 oz), or one shot of hard liquor (1 oz).  Do not use any products that contain nicotine or tobacco, such as cigarettes and e-cigarettes. If you need help quitting, ask your health care provider. Summary  Having a healthy lifestyle and getting preventive care can help to protect your health and wellness after age 75.  Screening and testing are the best way to find a health problem early and help you avoid having a fall. Early diagnosis and treatment give you the best chance for managing medical conditions that are more common for people who are older than age 52.  Falls are a major cause of broken bones and head injuries in people who are older than age 9. Take precautions to prevent a fall at home.  Work with your health care provider to learn what changes you can make to improve your health and wellness and to prevent falls. This information is not intended to replace advice given to you by your health care provider. Make sure you discuss any questions you have with your health care provider. Document Revised: 12/04/2018 Document Reviewed: 06/26/2017 Elsevier Patient Education  2020 [employer].

## 2020-05-19 DIAGNOSIS — H401122 Primary open-angle glaucoma, left eye, moderate stage: Secondary | ICD-10-CM | POA: Diagnosis not present

## 2020-05-19 DIAGNOSIS — H401113 Primary open-angle glaucoma, right eye, severe stage: Secondary | ICD-10-CM | POA: Diagnosis not present

## 2020-05-25 ENCOUNTER — Ambulatory Visit (INDEPENDENT_AMBULATORY_CARE_PROVIDER_SITE_OTHER): Payer: Medicare Other | Admitting: Internal Medicine

## 2020-05-25 ENCOUNTER — Encounter: Payer: Self-pay | Admitting: Internal Medicine

## 2020-05-25 ENCOUNTER — Other Ambulatory Visit: Payer: Self-pay

## 2020-05-25 VITALS — BP 138/72 | HR 87 | Temp 98.6°F | Wt 125.6 lb

## 2020-05-25 DIAGNOSIS — I10 Essential (primary) hypertension: Secondary | ICD-10-CM | POA: Diagnosis not present

## 2020-05-25 DIAGNOSIS — M8580 Other specified disorders of bone density and structure, unspecified site: Secondary | ICD-10-CM

## 2020-05-25 DIAGNOSIS — Z23 Encounter for immunization: Secondary | ICD-10-CM

## 2020-05-25 DIAGNOSIS — G629 Polyneuropathy, unspecified: Secondary | ICD-10-CM

## 2020-05-25 DIAGNOSIS — R2689 Other abnormalities of gait and mobility: Secondary | ICD-10-CM

## 2020-05-25 DIAGNOSIS — E7800 Pure hypercholesterolemia, unspecified: Secondary | ICD-10-CM

## 2020-05-25 DIAGNOSIS — I4821 Permanent atrial fibrillation: Secondary | ICD-10-CM

## 2020-05-25 MED ORDER — GABAPENTIN 300 MG PO CAPS: ORAL_CAPSULE | ORAL | 3 refills | Status: DC

## 2020-05-25 NOTE — Assessment & Plan Note (Addendum)
Chronic Sees neurology - Dr Krista Blue Taking gabapentin, which helps Uses cane due to poor balance, no falls Deferred physical therapy Continue gabapentin 300 mg TID

## 2020-05-25 NOTE — Assessment & Plan Note (Signed)
Chronic Check lipid panel  Continue atorvastatin 20 mg daily Regular exercise and healthy diet encouraged  

## 2020-05-25 NOTE — Progress Notes (Signed)
Subjective:    Patient ID: [name], female    DOB: [birth year], 86 y.o.   MRN: [id number]  HPI The patient is here for follow up of their chronic medical problems, including Afib, htn, hyperlipidemia, neuropathy/weakness/poor balance.  She uses her cane to ambulate.  Her balance is worse. She denies falls.  She is very careful.  She does not feel that physical therapy would help and deferred any physical therapy.  She does have chronic right knee pain and has had injections in the past.  She has not seen orthopedics in a long time.  She is still smoking.  She does not smoke much and states she is not really working on quitting.   Medications and allergies reviewed with patient and updated if appropriate.  Patient Active Problem List   Diagnosis Date Noted  . COPD (chronic obstructive pulmonary disease) ([place]) 12/01/2019  . DOE (dyspnea on exertion) 11/23/2019  . Neuropathy 07/03/2018  . Low back pain 06/03/2018  . Rectal prolapse, small 03/25/2018  . Leg weakness, bilateral 03/25/2018  . Hemorrhoids 09/25/2017  . Arthritis of sternoclavicular joint 06/10/2017  . Cervical radiculopathy 06/10/2017  . Thyroid nodule 03/25/2017  . Numbness in both hands 09/24/2016  . Poor balance 09/24/2016  . Irritable bowel syndrome (IBS) 02/24/2015  . Long term current use of anticoagulant therapy 02/07/2012  . Atrial fibrillation ([place]) 02/01/2012  . S/P dilatation of esophageal stricture 12/25/2010  . Osteopenia of the elderly 06/23/2010  . Vitamin D deficiency 06/20/2009  . RENAL CYST 12/20/2008  . CIGARETTE SMOKER 12/22/2007  . RESTLESS LEGS SYNDROME 12/22/2007  . Glaucoma 12/22/2007  . DIVERTICULOSIS OF COLON 12/22/2007  . HYPERCHOLESTEROLEMIA 11/25/2007  . Essential hypertension 11/25/2007  . Osteoarthritis 11/25/2007    Current Outpatient Medications on File Prior to Visit  Medication Sig Dispense Refill  . amLODipine (NORVASC) 5 MG tablet Take 1 tablet (5 mg total) by  mouth daily. 90 tablet 3  . atorvastatin (LIPITOR) 20 MG tablet TAKE 1 TABLET BY MOUTH DAILY AT 6PM 90 tablet 1  . brimonidine (ALPHAGAN) 0.2 % ophthalmic solution Place 1 drop into the left eye 2 times daily.    . Calcium Carb-Cholecalciferol (CALCIUM-VITAMIN D3) 600-400 MG-UNIT CAPS Take 1 tablet by mouth every other day.    . dorzolamide-timolol (COSOPT) 22.3-6.8 MG/ML ophthalmic solution Place 1 drop into both eyes 2 times daily.    Marland Kitchen ELIQUIS 2.5 MG TABS tablet TAKE 1 TABLET BY MOUTH TWICE A DAY 180 tablet 1  . gabapentin (NEURONTIN) 300 MG capsule TAKE 1 CAPSULE BY MOUTH THREE TIMES A DAY 270 capsule 0  . KLOR-CON M10 10 MEQ tablet TAKE 1 TABLET BY MOUTH EVERY DAY 90 tablet 0  . latanoprost (XALATAN) 0.005 % ophthalmic solution Place 1 drop into both eyes nightly.    . metoprolol tartrate (LOPRESSOR) 50 MG tablet TAKE 1 TABLET BY MOUTH TWICE A DAY 180 tablet 3  . pilocarpine (PILOCAR) 4 % ophthalmic solution Place 1 drop into the left eye 3 times daily.    Marland Kitchen tiotropium (SPIRIVA HANDIHALER) 18 MCG inhalation capsule Place 1 capsule (18 mcg total) into inhaler and inhale daily. 30 capsule 12  . Vitamin D, Cholecalciferol, 50 MCG (2000 UT) CAPS Take 1 tablet by mouth every other day.    . [DISCONTINUED] potassium chloride (K-DUR) 10 MEQ tablet Take 1 tablet (10 mEq total) by mouth daily. 30 tablet 2   No current facility-administered medications on file prior to visit.    Past  Medical History:  Diagnosis Date  . Anxiety   . Atrial fibrillation ([place])   . Cigarette smoker   . Depression   . Diverticulosis of colon   . DJD (degenerative joint disease)   . Family history of colon cancer   . Glaucoma   . Hemorrhoids   . History of sebaceous cyst    R posterior scalp - observation elected 01/2015 after surgical eval  . Hyperlipidemia   . Hypertension   . Osteopenia   . Renal cyst   . Restless leg syndrome   . Vitamin D deficiency     Past Surgical History:  Procedure Laterality  Date  . CARDIOVERSION  04/23/2012   Procedure: CARDIOVERSION;  Surgeon: Hillary Bow, MD;  Location: [facility] ENDOSCOPY;  Service: Cardiovascular;  Laterality: N/A;  . CATARACT EXTRACTION    . COLONOSCOPY    . retina surgery x 2  2013  . TOTAL HIP ARTHROPLASTY  2/05   right by Dr. Tonita Cong  . UPPER GASTROINTESTINAL ENDOSCOPY      Social History   Socioeconomic History  . Marital status: Widowed    Spouse name: Not on file  . Number of children: 0  . Years of education: Not on file  . Highest education level: Not on file  Occupational History  . Occupation: Retired  Tobacco Use  . Smoking status: Light Tobacco Smoker    Packs/day: 0.30  . Smokeless tobacco: Never Used  . Tobacco comment: 4-5 cig a day  Substance and Sexual Activity  . Alcohol use: No    Alcohol/week: 0.0 standard drinks  . Drug use: No  . Sexual activity: Not Currently  Other Topics Concern  . Not on file  Social History Narrative   Exercises 2 times per week   Caffeine use: 1 cup coffee per day   Lives alone, widowed 1978   Social Determinants of Health   Financial Resource Strain: Low Risk   . Difficulty of Paying Living Expenses: Not very hard  Food Insecurity:   . Worried About Charity fundraiser in the Last Year: Not on file  . Ran Out of Food in the Last Year: Not on file  Transportation Needs:   . Lack of Transportation (Medical): Not on file  . Lack of Transportation (Non-Medical): Not on file  Physical Activity:   . Days of Exercise per Week: Not on file  . Minutes of Exercise per Session: Not on file  Stress:   . Feeling of Stress : Not on file  Social Connections:   . Frequency of Communication with Friends and Family: Not on file  . Frequency of Social Gatherings with Friends and Family: Not on file  . Attends Religious Services: Not on file  . Active Member of Clubs or Organizations: Not on file  . Attends Archivist Meetings: Not on file  . Marital Status: Not on file     Family History  Problem Relation Age of Onset  . Hypertension Mother   . Diabetes Mother   . Colon cancer Brother   . Prostate cancer Father   . Breast cancer Other        niece  . Diabetes Brother   . Diabetes Sister     Review of Systems  Constitutional: Positive for fatigue. Negative for chills and fever.  Respiratory: Positive for shortness of breath (chronic = with exertion). Negative for cough and wheezing.   Cardiovascular: Positive for palpitations (with rushing) and leg swelling. Negative for  chest pain.  Musculoskeletal: Positive for arthralgias (right knee, fingers) and gait problem.  Neurological: Negative for dizziness, light-headedness and headaches.  Hematological: Does not bruise/bleed easily.       Objective:   Vitals:   05/25/20 1108  BP: 138/72  Pulse: 87  Temp: 98.6 F (37 C)  SpO2: 99%   BP Readings from Last 3 Encounters:  05/25/20 138/72  03/24/20 124/78  12/29/19 126/80   Wt Readings from Last 3 Encounters:  05/25/20 125 lb 9.6 oz (57 kg)  03/24/20 125 lb (56.7 kg)  12/29/19 125 lb (56.7 kg)   Body mass index is 20.27 kg/m.   Physical Exam    Constitutional: Appears well-developed and well-nourished. No distress.  HENT:  Head: Normocephalic and atraumatic.  Neck: Neck supple. No tracheal deviation present. No thyromegaly present.  No cervical lymphadenopathy Cardiovascular: Normal rate, regular rhythm and normal heart sounds.  No murmur heard. No carotid bruit .  No edema Pulmonary/Chest: Effort normal and breath sounds normal. No respiratory distress. No has no wheezes. No rales.  Skin: Skin is warm and dry. Not diaphoretic.  Psychiatric: Normal mood and affect. Behavior is normal.      Assessment & Plan:    See Problem List for Assessment and Plan of chronic medical problems.    This visit occurred during the SARS-CoV-2 public health emergency.  Safety protocols were in place, including screening questions prior to the  visit, additional usage of staff PPE, and extensive cleaning of exam room while observing appropriate contact time as indicated for disinfecting solutions.

## 2020-05-25 NOTE — Assessment & Plan Note (Signed)
Chronic Sees cardio - management per them On eliquis, metoprolol Cbc, cmp

## 2020-05-25 NOTE — Assessment & Plan Note (Signed)
Chronic DEXA due-we did discuss getting a bone density and possible medication if needed.  After discussing this she decided she would like to think about this-we will discuss at her next visit Encouraged her to be as active as possible-use cane or walker due to risk of falls Continue calcium and vitamin D

## 2020-05-25 NOTE — Patient Instructions (Addendum)
  Blood work was ordered.     Flu immunization administered today.     Medications reviewed and updated.  Changes include :   None      Please followup in 6 months   

## 2020-05-25 NOTE — Assessment & Plan Note (Signed)
Chronic BP well controlled Continue amlodipine 5 mg daily, metoprolol 50 mg BID cmp

## 2020-05-25 NOTE — Assessment & Plan Note (Addendum)
Chronic Sees Dr Krista Blue Related to peripheral neuropathy, chronic lumbar radiculopathy and knee OA Taking gabapentin for neuropathy Uses cane or walker Discussed PT-she declined because she does not feel that this is helpful

## 2020-05-26 LAB — COMPLETE METABOLIC PANEL WITH GFR
AG Ratio: 1.8 (calc) (ref 1.0–2.5)
ALT: 16 U/L (ref 6–29)
AST: 20 U/L (ref 10–35)
Albumin: 4.2 g/dL (ref 3.6–5.1)
Alkaline phosphatase (APISO): 82 U/L (ref 37–153)
BUN/Creatinine Ratio: 14 (calc) (ref 6–22)
BUN: 13 mg/dL (ref 7–25)
CO2: 30 mmol/L (ref 20–32)
Calcium: 9.8 mg/dL (ref 8.6–10.4)
Chloride: 110 mmol/L (ref 98–110)
Creat: 0.9 mg/dL — ABNORMAL HIGH (ref 0.60–0.88)
GFR, Est African American: 68 mL/min/{1.73_m2} (ref >=60)
GFR, Est Non African American: 59 mL/min/{1.73_m2} — ABNORMAL LOW (ref >=60)
Globulin: 2.4 g/dL (calc) (ref 1.9–3.7)
Glucose, Bld: 74 mg/dL (ref 65–99)
Potassium: 4.5 mmol/L (ref 3.5–5.3)
Sodium: 144 mmol/L (ref 135–146)
Total Bilirubin: 0.8 mg/dL (ref 0.2–1.2)
Total Protein: 6.6 g/dL (ref 6.1–8.1)

## 2020-05-26 LAB — LIPID PANEL
Cholesterol: 159 mg/dL (ref <200)
HDL: 67 mg/dL (ref >=50)
LDL Cholesterol (Calc): 77 mg/dL (calc)
Non-HDL Cholesterol (Calc): 92 mg/dL (calc) (ref <130)
Total CHOL/HDL Ratio: 2.4 (calc) (ref <5.0)
Triglycerides: 70 mg/dL (ref <150)

## 2020-05-26 LAB — CBC WITH DIFFERENTIAL/PLATELET
Absolute Monocytes: 460 cells/uL (ref 200–950)
Basophils Absolute: 32 cells/uL (ref 0–200)
Basophils Relative: 0.8 %
Eosinophils Absolute: 112 cells/uL (ref 15–500)
Eosinophils Relative: 2.8 %
HCT: 42 % (ref 35.0–45.0)
Hemoglobin: 13.8 g/dL (ref 11.7–15.5)
Lymphs Abs: 1332 cells/uL (ref 850–3900)
MCH: 30.4 pg (ref 27.0–33.0)
MCHC: 32.9 g/dL (ref 32.0–36.0)
MCV: 92.5 fL (ref 80.0–100.0)
MPV: 10.3 fL (ref 7.5–12.5)
Monocytes Relative: 11.5 %
Neutro Abs: 2064 cells/uL (ref 1500–7800)
Neutrophils Relative %: 51.6 %
Platelets: 141 10*3/uL (ref 140–400)
RBC: 4.54 10*6/uL (ref 3.80–5.10)
RDW: 12.3 % (ref 11.0–15.0)
Total Lymphocyte: 33.3 %
WBC: 4 10*3/uL (ref 3.8–10.8)

## 2020-06-07 ENCOUNTER — Other Ambulatory Visit: Payer: Self-pay | Admitting: Internal Medicine

## 2020-07-06 ENCOUNTER — Telehealth: Payer: Self-pay | Admitting: Pharmacist

## 2020-07-06 NOTE — Progress Notes (Signed)
Chronic Care Management Pharmacy Assistant   Name: [name]  MRN: [id number] DOB: Apr 25, 1934  Reason for Encounter: General Adherence Call  Patient Questions:  1.  Have you seen any other providers since your last visit? The patient has seen [facility] ophthalmology on 05/19/20 and Dr. Billey Gosling [facility] on 05/25/20  2.  Any changes in your medicines or health? No    PCP : Binnie Rail, MD  Allergies:  No Known Allergies  Medications: Outpatient Encounter Medications as of 07/06/2020  Medication Sig  . amLODipine (NORVASC) 5 MG tablet Take 1 tablet (5 mg total) by mouth daily.  Marland Kitchen atorvastatin (LIPITOR) 20 MG tablet TAKE 1 TABLET BY MOUTH DAILY AT 6PM  . brimonidine (ALPHAGAN) 0.2 % ophthalmic solution Place 1 drop into the left eye 2 times daily.  . Calcium Carb-Cholecalciferol (CALCIUM-VITAMIN D3) 600-400 MG-UNIT CAPS Take 1 tablet by mouth every other day.  . dorzolamide-timolol (COSOPT) 22.3-6.8 MG/ML ophthalmic solution Place 1 drop into both eyes 2 times daily.  Marland Kitchen ELIQUIS 2.5 MG TABS tablet TAKE 1 TABLET BY MOUTH TWICE A DAY  . gabapentin (NEURONTIN) 300 MG capsule TAKE 1 CAPSULE BY MOUTH THREE TIMES A DAY  . KLOR-CON M10 10 MEQ tablet TAKE 1 TABLET BY MOUTH EVERY DAY  . latanoprost (XALATAN) 0.005 % ophthalmic solution Place 1 drop into both eyes nightly.  . metoprolol tartrate (LOPRESSOR) 50 MG tablet TAKE 1 TABLET BY MOUTH TWICE A DAY  . pilocarpine (PILOCAR) 4 % ophthalmic solution Place 1 drop into the left eye 3 times daily.  . Vitamin D, Cholecalciferol, 50 MCG (2000 UT) CAPS Take 1 tablet by mouth every other day.  . [DISCONTINUED] potassium chloride (K-DUR) 10 MEQ tablet Take 1 tablet (10 mEq total) by mouth daily.   No facility-administered encounter medications on file as of 07/06/2020.    Current Diagnosis: Patient Active Problem List   Diagnosis Date Noted  . COPD (chronic obstructive pulmonary disease) ([place]) 12/01/2019  . DOE  (dyspnea on exertion) 11/23/2019  . Neuropathy 07/03/2018  . Low back pain 06/03/2018  . Rectal prolapse, small 03/25/2018  . Leg weakness, bilateral 03/25/2018  . Hemorrhoids 09/25/2017  . Arthritis of sternoclavicular joint 06/10/2017  . Cervical radiculopathy 06/10/2017  . Thyroid nodule 03/25/2017  . Numbness in both hands 09/24/2016  . Poor balance 09/24/2016  . Irritable bowel syndrome (IBS) 02/24/2015  . Long term current use of anticoagulant therapy 02/07/2012  . Atrial fibrillation ([place]) 02/01/2012  . S/P dilatation of esophageal stricture 12/25/2010  . Osteopenia of the elderly 06/23/2010  . Vitamin D deficiency 06/20/2009  . RENAL CYST 12/20/2008  . CIGARETTE SMOKER 12/22/2007  . RESTLESS LEGS SYNDROME 12/22/2007  . Glaucoma 12/22/2007  . DIVERTICULOSIS OF COLON 12/22/2007  . HYPERCHOLESTEROLEMIA 11/25/2007  . Essential hypertension 11/25/2007  . Osteoarthritis 11/25/2007    Goals Addressed   None     Follow-Up:  Pharmacist Review   Spoke with patient who states that she is doing pretty good. She states that she doesn't have any concerns with her health or her medications. For the most part she is doing ok. She did state that she is continuing to lose sight in her left eye but that her eye doctor said that they will not do surgery just because at her age she still might end up losing her sight. She states that she has been on the same medications for years and there has been no changes. She did say  that she really does not have an appetite anymore, and gets very little exercise.      Wendy Poet, Clinical Pharmacist Assistant [facility]

## 2020-07-12 IMAGING — US US THYROID
1 series · 13 of 25 positions shown · non-contrast
Comparison: Prior thyroid ultrasound 02/18/2017

CLINICAL DATA: Goiter. 84-year-old female with a history of
multinodular goiter. She has previously undergone biopsy of nodules
in the left mid and left inferior gland on 03/07/2017.

EXAM:
THYROID ULTRASOUND
TECHNIQUE: Ultrasound examination of the thyroid gland and adjacent soft
tissues was performed.

[Series 1: us thyroid · 0.05mm/px · 13 of 68 slices shown]
[im 1/68]
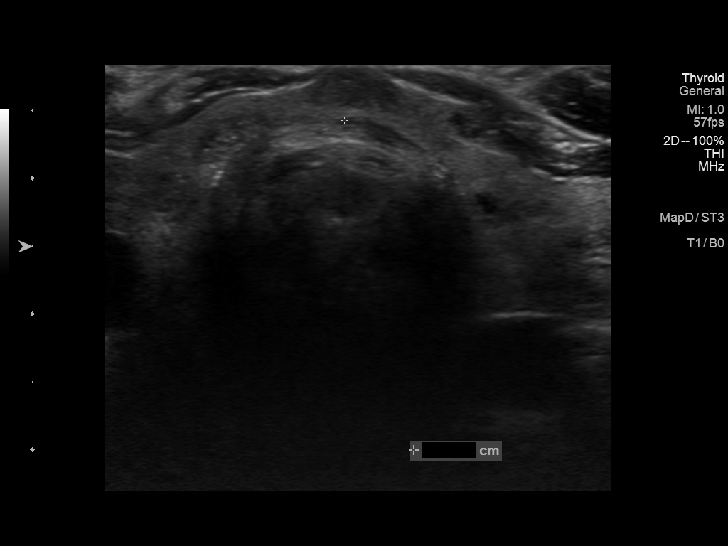
[im 6/68]
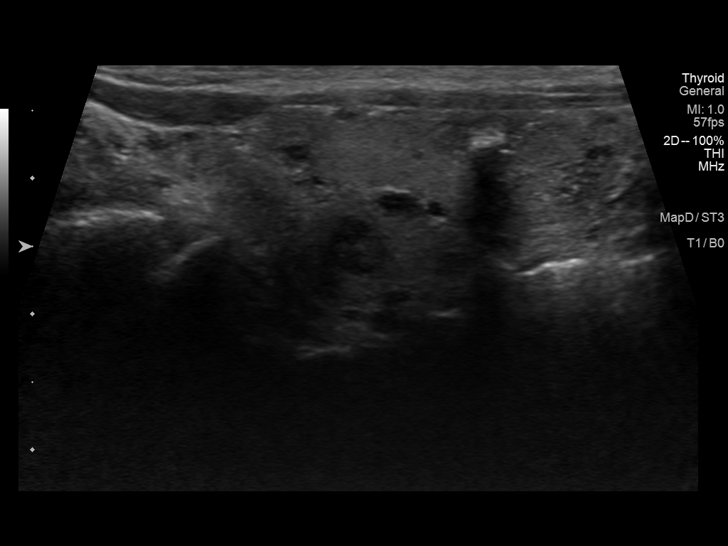
[im 12/68]
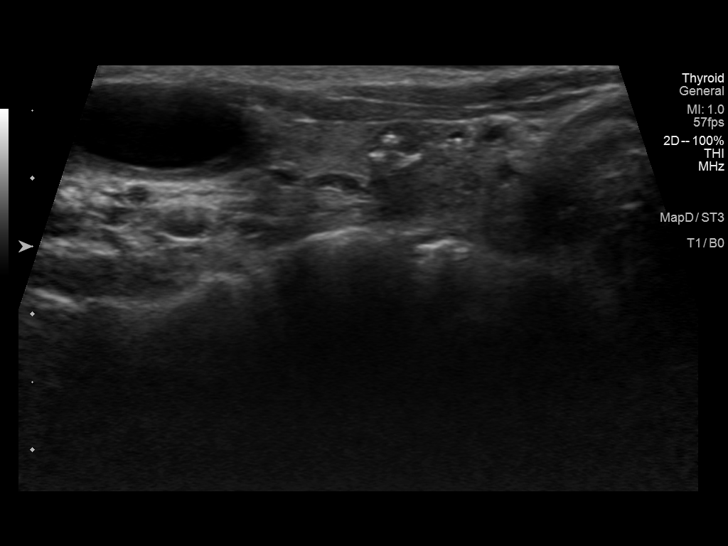
[im 17/68]
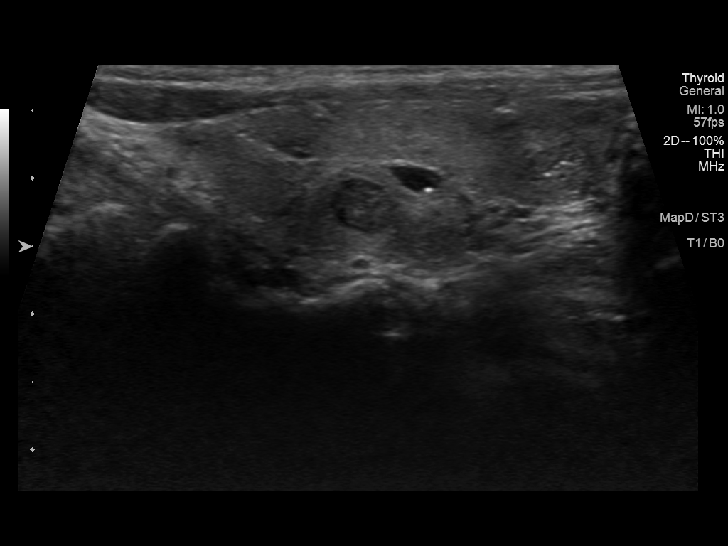
[im 23/68]
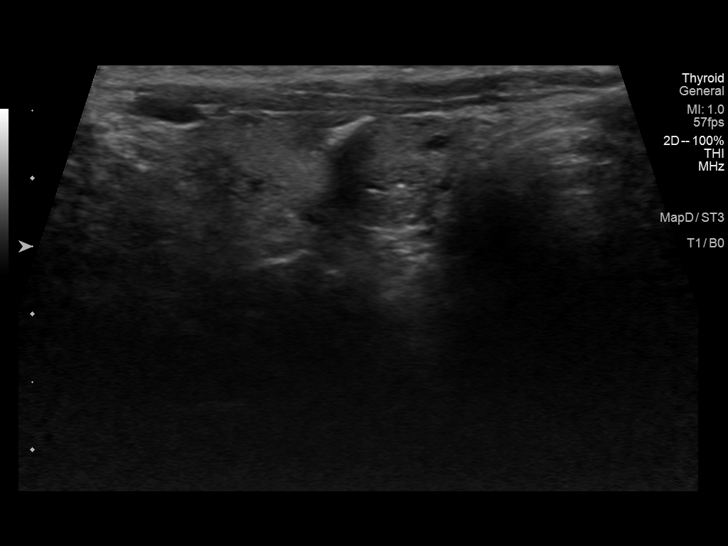
[im 28/68]
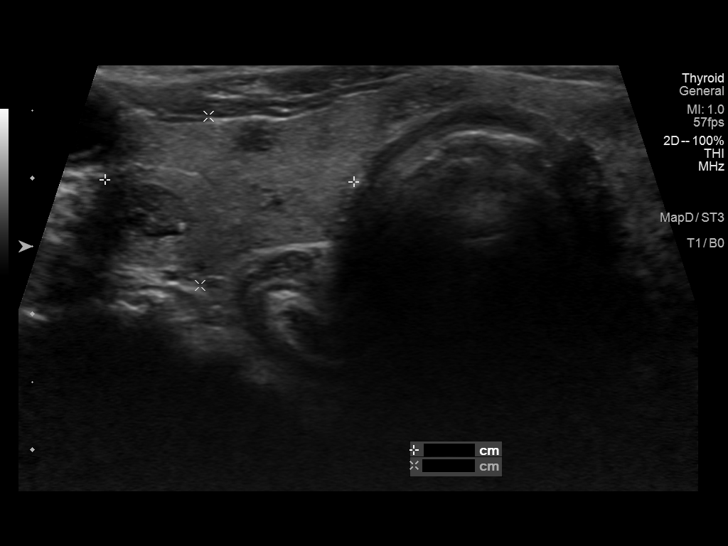
[im 34/68]
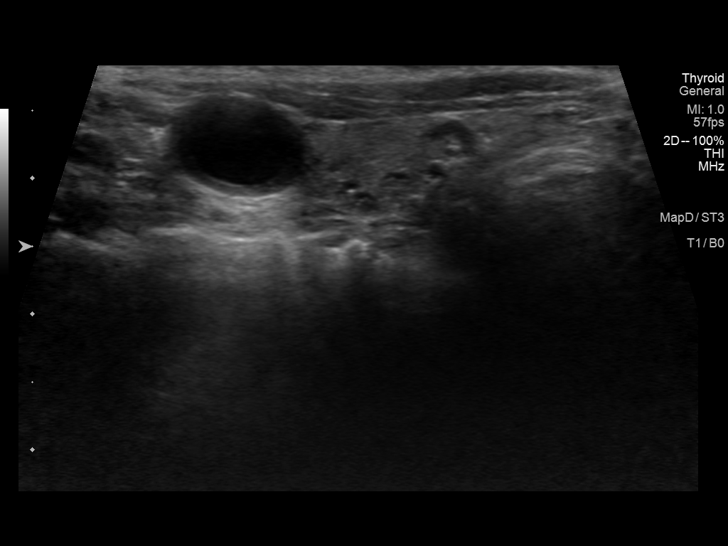
[im 40/68]
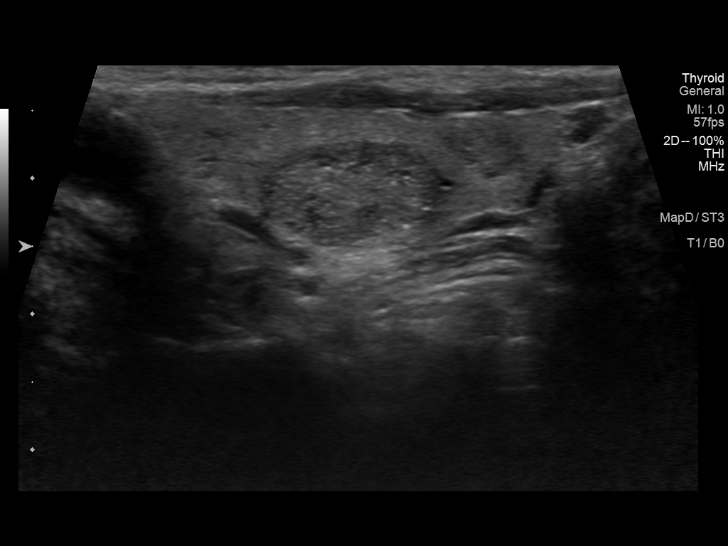
[im 45/68]
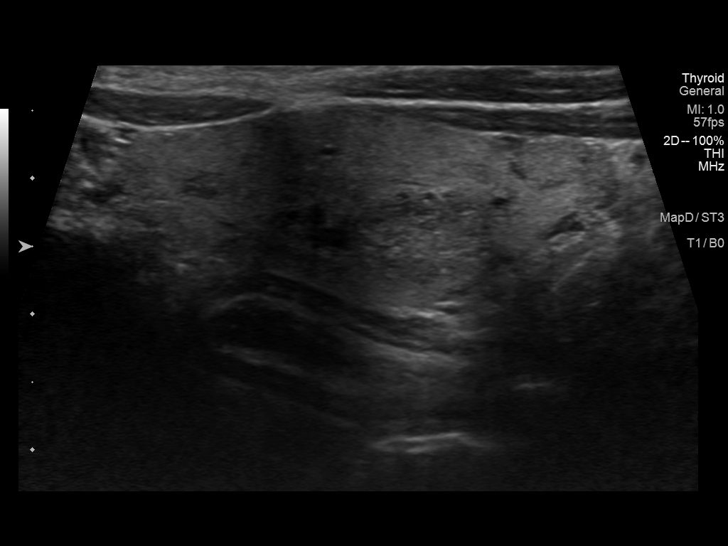
[im 51/68]
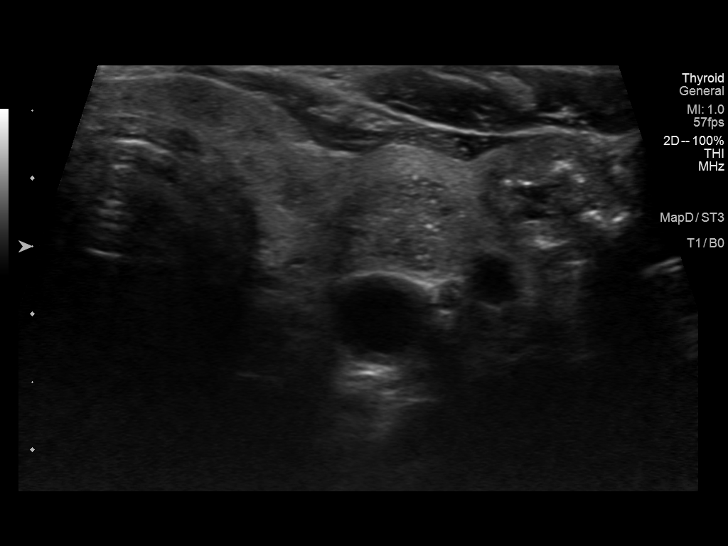
[im 56/68]
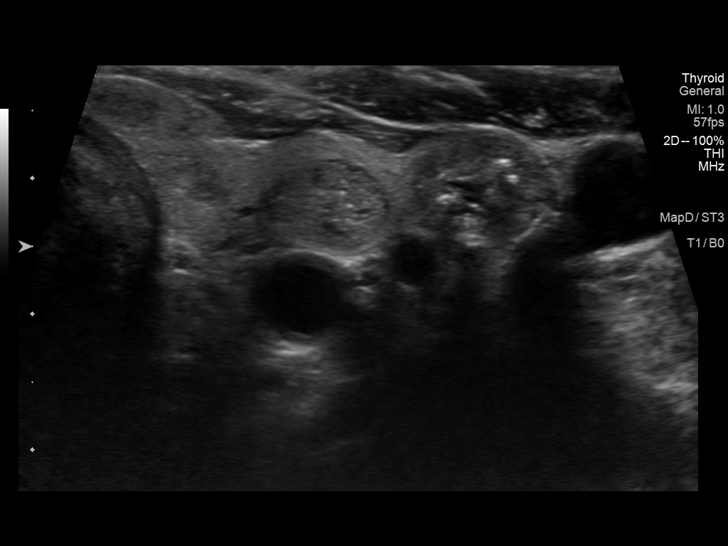
[im 62/68]
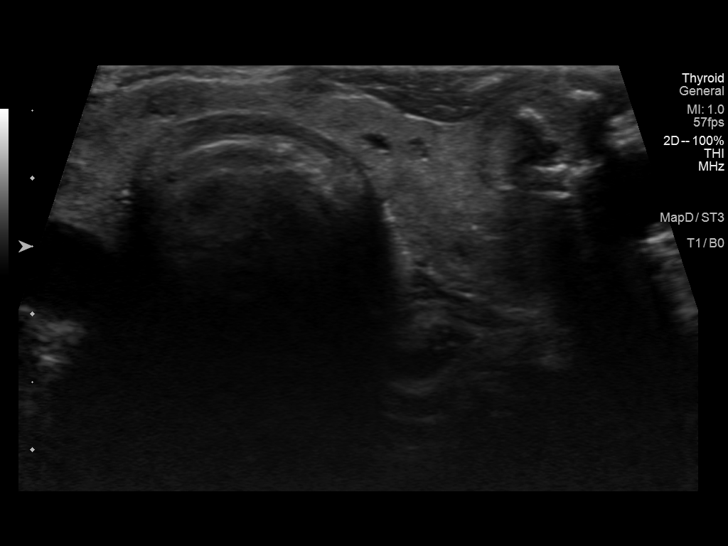
[im 68/68]
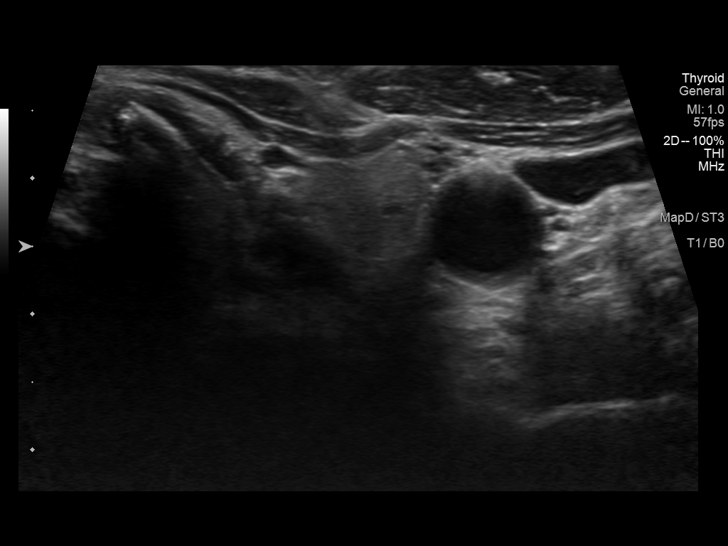

[13 of 25 positions shown; findings below may reference images not displayed]

FINDINGS: Parenchymal Echotexture: Moderately heterogenous

Isthmus: 0.4 cm

Right lobe: 4.2 x 1.3 x 1.8 cm

Left lobe: 3.7 x 1.1 x 2.5 cm

_________________________________________________________

Estimated total number of nodules >/= 1 cm: 2

Number of spongiform nodules >/=  2 cm not described below (TR1): 0

Number of mixed cystic and solid nodules >/= 1.5 cm not described
below (TR2): 0

_________________________________________________________

Nodule # 4: The previously biopsied nodule in the left mid gland
measures 1.4 x 0.8 x 1.1 cm which is insignificantly changed
compared to 1.5 x 0.8 x 0.8 cm previously. The nodule remains solid,
hypoechoic with punctate internal echogenic foci.

________________________________________________________

Nodule # 5: The previously biopsied nodule in the left inferior
gland measures 1.5 x 0.9 x 1.1 cm, insignificantly changed compared
to 1.5 x 0.8 x 0.8 cm previously. The nodule remains solid,
hypoechoic with dystrophic internal calcifications.

____________________________________________________________

Additional bilateral subcentimeter thyroid nodules are again noted
and demonstrate no significant interval change. None of these
nodules meet criteria for biopsy or further imaging evaluation.
IMPRESSION: 1. Stable appearance of the previously biopsied nodules in the left
mid and inferior gland. Recommend correlation with prior biopsy
results.
2. Additional bilateral subcentimeter thyroid nodules demonstrate no
significant interval change and do not meet criteria for further
follow-up.

The above is in keeping with the ACR TI-RADS recommendations - [HOSPITAL] 5302;[DATE].

## 2020-08-08 DIAGNOSIS — H401113 Primary open-angle glaucoma, right eye, severe stage: Secondary | ICD-10-CM | POA: Diagnosis not present

## 2020-08-08 DIAGNOSIS — H401122 Primary open-angle glaucoma, left eye, moderate stage: Secondary | ICD-10-CM | POA: Diagnosis not present

## 2020-08-26 ENCOUNTER — Other Ambulatory Visit: Payer: Self-pay | Admitting: Cardiology

## 2020-08-29 NOTE — Telephone Encounter (Signed)
Prescription refill request for Eliquis received. Indication: atrial fibrillation Last office visit: 4/21  crenshaw Scr: 0.9 9/21 Age:  87 Weight:57 kg  Prescription refilled

## 2020-09-12 ENCOUNTER — Ambulatory Visit: Payer: Medicare Other | Admitting: Pharmacist

## 2020-09-12 ENCOUNTER — Other Ambulatory Visit: Payer: Self-pay

## 2020-09-12 DIAGNOSIS — I4821 Permanent atrial fibrillation: Secondary | ICD-10-CM

## 2020-09-12 DIAGNOSIS — E7800 Pure hypercholesterolemia, unspecified: Secondary | ICD-10-CM

## 2020-09-12 DIAGNOSIS — I10 Essential (primary) hypertension: Secondary | ICD-10-CM

## 2020-09-12 NOTE — Patient Instructions (Signed)
Visit Information  Phone number for Pharmacist: [phone]  Goals Addressed            This Visit's Progress   . Pharmacy Care Plan       CARE PLAN ENTRY (see longitudinal plan of care for additional care plan information)  Current Barriers:  . Chronic Disease Management support, education, and care coordination needs related to Hypertension, Hyperlipidemia, and Atrial Fibrillation   Hypertension BP Readings from Last 3 Encounters:  05/25/20 138/72  03/24/20 124/78  12/29/19 126/80 .  Pharmacist Clinical Goal(s): o Over the next 365 days, patient will work with PharmD and providers to maintain BP goal <130/80 . Current regimen:  . Amlodipine 5 mg daily . Metoprolol tartrate 50 mg daily . Interventions: o Discussed BP goals and benefits of medication for prevention of heart attack / stroke . Patient self care activities - Over the next 365 days, patient will: o Check BP as needed, document, and provide at future appointments o Ensure daily salt intake < 2300 mg/day  Hyperlipidemia Lab Results  Component Value Date/Time   LDLCALC 77 05/25/2020 11:44 AM .  Pharmacist Clinical Goal(s): o Over the next 365 days, patient will work with PharmD and providers to maintain LDL goal < 100 . Current regimen:  o Atorvastatin 20 mg daily . Interventions: o Discussed cholesterol goals and benefits of medication for prevention of heart attack / stroke . Patient self care activities - Over the next 365 days, patient will: o Continue medication as prescribed  Atrial Fibrillation . Pharmacist Clinical Goal(s) o Over the next 365 days, patient will work with PharmD and providers to optimize therapy . Current regimen:  o Metoprolol tartrate 50 mg BID o Eliquis 2.5 mg BID . Interventions: o Discussed common side effects of Eliquis: bleeding and bruising . Patient self care activities - Over the next 365 days, patient will: o Monitor for signs of bleeding o Avoid Advil, Motrin,  Aleve due to bleeding risk  Medication management . Pharmacist Clinical Goal(s): o Over the next 365 days, patient will work with PharmD and providers to maintain optimal medication adherence . Current pharmacy: [facility] . Interventions o Comprehensive medication review performed. o Continue current medication management strategy . Patient self care activities - Over the next 365 days, patient will: o Focus on medication adherence by fill date o Take medications as prescribed o Report any questions or concerns to PharmD and/or provider(s)  Please see past updates related to this goal by clicking on the "Past Updates" button in the selected goal       There are no care plans to display for this patient.  The patient verbalized understanding of instructions, educational materials, and care plan provided today and declined offer to receive copy of patient instructions, educational materials, and care plan.  Telephone follow up appointment with pharmacy team member scheduled for: 1 year  Charlene Brooke, PharmD, [facility] Clinical Pharmacist [facility] [phone]

## 2020-09-12 NOTE — Chronic Care Management (AMB) (Signed)
Chronic Care Management Pharmacy  Name: [name]  MRN: [id number] DOB: 1934-05-28   Chief Complaint/ HPI  [name],  87 y.o. , female presents for their Follow-Up CCM visit with the clinical pharmacist via telephone due to COVID-19 Pandemic.  PCP : Binnie Rail, MD Patient Care Team: Binnie Rail, MD as PCP - General (Internal Medicine) Stanford Breed Denice Bors, MD as PCP - Cardiology (Cardiology) Lafayette Dragon, MD (Inactive) (Gastroenterology) Stanford Breed Denice Bors, MD (Cardiology) Susa Day, MD (Orthopedic Surgery) Bond, Tracie Harrier, MD (Ophthalmology) Erroll Luna, MD (General Surgery) Charlton Haws, [facility] as Pharmacist (Pharmacist)  Their chronic conditions include: Hypertension, Hyperlipidemia, Atrial Fibrillation, COPD, Osteopenia, Osteoarthritis, Tobacco use and IBS, Neuropathy, RLS, Glaucoma  [name] is originally from [place] but spent most of her life in [place]. Patient lives alone, she gets help from neighbor/caregiver who drives her to doctors visits, picks up medications for her, and cooks some meals for her. She has limited mobility, uses a can to get around, and spends most of her time doing household chores and watching TV.  Office Visits: 05/25/20 Dr Quay Burow OV: chronic f/u, pt deferred DEXA, declined PT for neuropathy/balance  11/23/19 Dr Quay Burow OV: DOE is stable, still smoking, not ready to quit. Trial Anoro samples.  Consult Visit: 03/24/20 Dr Vaughan Browner (pulmonary): cannot use Ellipta d/t lack of dexterity, cost. PFTs - mild COPD, Mild sx, not sure she needs regular controller inhaler. Plan to observe off inhalers.  03/14/20 Dr Edilia Bo (eye): f/u for glaucoma.  12/22/19 Dr Stanford Breed (cardiology): added amlodipine 5 mg for elevated BP in clinic.  No Known Allergies  Medications: Outpatient Encounter Medications as of 09/12/2020  Medication Sig  . amLODipine (NORVASC) 5 MG tablet Take 1 tablet (5 mg total) by mouth daily.  Marland Kitchen  atorvastatin (LIPITOR) 20 MG tablet TAKE 1 TABLET BY MOUTH DAILY AT 6PM  . brimonidine (ALPHAGAN) 0.2 % ophthalmic solution Place 1 drop into the left eye 2 times daily.  . Calcium Carb-Cholecalciferol (CALCIUM-VITAMIN D3) 600-400 MG-UNIT CAPS Take 1 tablet by mouth every other day.  . dorzolamide-timolol (COSOPT) 22.3-6.8 MG/ML ophthalmic solution Place 1 drop into both eyes 2 times daily.  Marland Kitchen ELIQUIS 2.5 MG TABS tablet TAKE 1 TABLET BY MOUTH TWICE A DAY  . gabapentin (NEURONTIN) 300 MG capsule TAKE 1 CAPSULE BY MOUTH THREE TIMES A DAY  . latanoprost (XALATAN) 0.005 % ophthalmic solution Place 1 drop into both eyes nightly.  . metoprolol tartrate (LOPRESSOR) 50 MG tablet TAKE 1 TABLET BY MOUTH TWICE A DAY  . pilocarpine (PILOCAR) 4 % ophthalmic solution Place 1 drop into the left eye 3 times daily.  . Vitamin D, Cholecalciferol, 50 MCG (2000 UT) CAPS Take 1 tablet by mouth every other day.  . [DISCONTINUED] KLOR-CON M10 10 MEQ tablet TAKE 1 TABLET BY MOUTH EVERY DAY (Patient not taking: Reported on 09/12/2020)  . [DISCONTINUED] potassium chloride (K-DUR) 10 MEQ tablet Take 1 tablet (10 mEq total) by mouth daily.   No facility-administered encounter medications on file as of 09/12/2020.     Current Diagnosis/Assessment:    Goals Addressed            This Visit's Progress   . Pharmacy Care Plan       CARE PLAN ENTRY (see longitudinal plan of care for additional care plan information)  Current Barriers:  . Chronic Disease Management support, education, and care coordination needs related to Hypertension, Hyperlipidemia, and Atrial Fibrillation   Hypertension BP Readings  from Last 3 Encounters:  05/25/20 138/72  03/24/20 124/78  12/29/19 126/80 .  Pharmacist Clinical Goal(s): o Over the next 365 days, patient will work with PharmD and providers to maintain BP goal <130/80 . Current regimen:  . Amlodipine 5 mg daily . Metoprolol tartrate 50 mg daily . Interventions: o Discussed  BP goals and benefits of medication for prevention of heart attack / stroke . Patient self care activities - Over the next 365 days, patient will: o Check BP as needed, document, and provide at future appointments o Ensure daily salt intake < 2300 mg/day  Hyperlipidemia Lab Results  Component Value Date/Time   LDLCALC 77 05/25/2020 11:44 AM .  Pharmacist Clinical Goal(s): o Over the next 365 days, patient will work with PharmD and providers to maintain LDL goal < 100 . Current regimen:  o Atorvastatin 20 mg daily . Interventions: o Discussed cholesterol goals and benefits of medication for prevention of heart attack / stroke . Patient self care activities - Over the next 365 days, patient will: o Continue medication as prescribed  Atrial Fibrillation . Pharmacist Clinical Goal(s) o Over the next 365 days, patient will work with PharmD and providers to optimize therapy . Current regimen:  o Metoprolol tartrate 50 mg BID o Eliquis 2.5 mg BID . Interventions: o Discussed common side effects of Eliquis: bleeding and bruising . Patient self care activities - Over the next 365 days, patient will: o Monitor for signs of bleeding o Avoid Advil, Motrin, Aleve due to bleeding risk  Medication management . Pharmacist Clinical Goal(s): o Over the next 365 days, patient will work with PharmD and providers to maintain optimal medication adherence . Current [facility] . Interventions o Comprehensive medication review performed. o Continue current medication management strategy . Patient self care activities - Over the next 365 days, patient will: o Focus on medication adherence by fill date o Take medications as prescribed o Report any questions or concerns to PharmD and/or provider(s)  Please see past updates related to this goal by clicking on the "Past Updates" button in the selected goal        Hypertension   BP goal is:  <130/80  Office blood pressures are  BP Readings  from Last 3 Encounters:  05/25/20 138/72  03/24/20 124/78  12/29/19 126/80   Patient checks BP at home several times per month Patient home BP readings are ranging: 120s/80s  Patient has failed these meds in the past: n/a Patient is currently controlled on the following medications:  . Amlodipine 5 mg daily . Metoprolol tartrate 50 mg daily  We discussed BP goal, benefits of medications; patient denies side effects or issues;  Plan  Continue current medications   AFIB   Patient is currently rate controlled. Office heart rates are  Pulse Readings from Last 3 Encounters:  05/25/20 87  03/24/20 90  12/29/19 80   CHA2DS2-VASc Score = 3  The patient's score is based upon: CHF History: No HTN History: Yes Diabetes History: No Stroke History: No Vascular Disease History: No  Patient has failed these meds in past: n/a Patient is currently controlled on the following medications:  Marland Kitchen Metoprolol tartrate 50 mg BID . Eliquis 2.5 mg BID  We discussed: purpose of medications; bleeding risk with Eliquis; pt denies bleeding/bruising.  Plan  Continue current medications  Hyperlipidemia   LDL goal < 100  Lipid Panel     Component Value Date/Time   CHOL 159 05/25/2020 1144   TRIG 70  05/25/2020 1144   HDL 67 05/25/2020 1144   LDLCALC 77 05/25/2020 1144    Hepatic Function Latest Ref Rng & Units 05/25/2020 11/23/2019 03/24/2019  Total Protein 6.1 - 8.1 g/dL 6.6 6.5 6.9  Albumin 3.5 - 5.2 g/dL - 4.0 4.2  AST 10 - 35 U/L 20 19 18   ALT 6 - 29 U/L 16 20 12   Alk Phosphatase 39 - 117 U/L - 69 68  Total Bilirubin 0.2 - 1.2 mg/dL 0.8 0.8 0.7  Bilirubin, Direct 0.0 - 0.3 mg/dL - - -     The ASCVD Risk score ([place]., et al., 2013) failed to calculate for the following reasons:   The 2013 ASCVD risk score is only valid for ages 52 to 76   Patient has failed these meds in past: n/a Patient is currently controlled on the following medications:  . Atorvastatin 20 mg  daily  We discussed:  Cholesterol goals; benefits of statin for ASCVD risk reduction  Plan  Continue current medications  Medication Management   [facility] Uses pill box? No - prefers bottles Pt endorses compliance  We discussed: Medications [facility], pt's neighbor typically picks them up for her. Pt denies issues paying for medications.   Plan  Continue current medication management strategy    Follow up: 12 month phone visit  Charlene Brooke, PharmD, [facility] Clinical Pharmacist [facility] [phone]

## 2020-11-21 NOTE — Patient Instructions (Addendum)
  Blood work was ordered.     Medications changes include :   You can try stopping the gabapentin if you want.  If you find that it was helping you can restart it.    Your prescription(s) have been submitted to your pharmacy. Please take as directed and contact our office if you believe you are having problem(s) with the medication(s).    Please followup in 6 months

## 2020-11-21 NOTE — Progress Notes (Signed)
Subjective:    Patient ID: [name], female    DOB: 24-Feb-1934, 87 y.o.   MRN: [id number]  HPI The patient is here for follow up of their chronic medical problems, including Afib, htn, hyperlipidemia, neuropathy/weakness/poor balance   She is here with a family member.  She does wonder if she needs to take all the medication she is currently taking.  She is frustrated by the pain she is not able to do, her low energy level and fatigue.  Her balance and stamina is poor.  She wonders why other people can have someone come in to help them and she is not able to.  Medications and allergies reviewed with patient and updated if appropriate.  Patient Active Problem List   Diagnosis Date Noted  . COPD (chronic obstructive pulmonary disease) ([place]) 12/01/2019  . DOE (dyspnea on exertion) 11/23/2019  . Neuropathy 07/03/2018  . Low back pain 06/03/2018  . Rectal prolapse, small 03/25/2018  . Leg weakness, bilateral 03/25/2018  . Hemorrhoids 09/25/2017  . Arthritis of sternoclavicular joint 06/10/2017  . Cervical radiculopathy 06/10/2017  . Thyroid nodule 03/25/2017  . Numbness in both hands 09/24/2016  . Poor balance 09/24/2016  . Irritable bowel syndrome (IBS) 02/24/2015  . Long term current use of anticoagulant therapy 02/07/2012  . Atrial fibrillation ([place]) 02/01/2012  . S/P dilatation of esophageal stricture 12/25/2010  . Osteopenia of the elderly 06/23/2010  . Vitamin D deficiency 06/20/2009  . RENAL CYST 12/20/2008  . CIGARETTE SMOKER 12/22/2007  . RESTLESS LEGS SYNDROME 12/22/2007  . Glaucoma 12/22/2007  . DIVERTICULOSIS OF COLON 12/22/2007  . HYPERCHOLESTEROLEMIA 11/25/2007  . Essential hypertension 11/25/2007  . Osteoarthritis 11/25/2007    Current Outpatient Medications on File Prior to Visit  Medication Sig Dispense Refill  . amLODipine (NORVASC) 5 MG tablet Take 1 tablet (5 mg total) by mouth daily. 90 tablet 3  . atorvastatin (LIPITOR) 20 MG tablet TAKE 1  TABLET BY MOUTH DAILY AT 6PM 90 tablet 1  . brimonidine (ALPHAGAN) 0.2 % ophthalmic solution Place 1 drop into the left eye 2 times daily.    . Calcium Carb-Cholecalciferol (CALCIUM-VITAMIN D3) 600-400 MG-UNIT CAPS Take 1 tablet by mouth every other day.    . dorzolamide-timolol (COSOPT) 22.3-6.8 MG/ML ophthalmic solution Place 1 drop into both eyes 2 times daily.    Marland Kitchen ELIQUIS 2.5 MG TABS tablet TAKE 1 TABLET BY MOUTH TWICE A DAY 180 tablet 1  . gabapentin (NEURONTIN) 300 MG capsule TAKE 1 CAPSULE BY MOUTH THREE TIMES A DAY 270 capsule 3  . latanoprost (XALATAN) 0.005 % ophthalmic solution Place 1 drop into both eyes nightly.    . metoprolol tartrate (LOPRESSOR) 50 MG tablet TAKE 1 TABLET BY MOUTH TWICE A DAY 180 tablet 3  . pilocarpine (PILOCAR) 4 % ophthalmic solution Place 1 drop into the left eye 3 times daily.    . Vitamin D, Cholecalciferol, 50 MCG (2000 UT) CAPS Take 1 tablet by mouth every other day.    . [DISCONTINUED] potassium chloride (K-DUR) 10 MEQ tablet Take 1 tablet (10 mEq total) by mouth daily. 30 tablet 2   No current facility-administered medications on file prior to visit.    Past Medical History:  Diagnosis Date  . Anxiety   . Atrial fibrillation ([place])   . Cigarette smoker   . Depression   . Diverticulosis of colon   . DJD (degenerative joint disease)   . Family history of colon cancer   . Glaucoma   .  Hemorrhoids   . History of sebaceous cyst    R posterior scalp - observation elected 01/2015 after surgical eval  . Hyperlipidemia   . Hypertension   . Osteopenia   . Renal cyst   . Restless leg syndrome   . Vitamin D deficiency     Past Surgical History:  Procedure Laterality Date  . CARDIOVERSION  04/23/2012   Procedure: CARDIOVERSION;  Surgeon: Hillary Bow, MD;  Location: [facility] ENDOSCOPY;  Service: Cardiovascular;  Laterality: N/A;  . CATARACT EXTRACTION    . COLONOSCOPY    . retina surgery x 2  2013  . TOTAL HIP ARTHROPLASTY  2/05   right by Dr.  Tonita Cong  . UPPER GASTROINTESTINAL ENDOSCOPY      Social History   Socioeconomic History  . Marital status: Widowed    Spouse name: Not on file  . Number of children: 0  . Years of education: Not on file  . Highest education level: Not on file  Occupational History  . Occupation: Retired  Tobacco Use  . Smoking status: Light Tobacco Smoker    Packs/day: 0.30  . Smokeless tobacco: Never Used  . Tobacco comment: 4-5 cig a day  Substance and Sexual Activity  . Alcohol use: No    Alcohol/week: 0.0 standard drinks  . Drug use: No  . Sexual activity: Not Currently  Other Topics Concern  . Not on file  Social History Narrative   Exercises 2 times per week   Caffeine use: 1 cup coffee per day   Lives alone, widowed 1978   Social Determinants of Health   Financial Resource Strain: Low Risk   . Difficulty of Paying Living Expenses: Not very hard  Food Insecurity: Not on file  Transportation Needs: Not on file  Physical Activity: Not on file  Stress: Not on file  Social Connections: Not on file    Family History  Problem Relation Age of Onset  . Hypertension Mother   . Diabetes Mother   . Colon cancer Brother   . Prostate cancer Father   . Breast cancer Other        niece  . Diabetes Brother   . Diabetes Sister     Review of Systems  Constitutional: Negative for chills and fever.  Respiratory: Positive for shortness of breath (with acitivty). Negative for cough and wheezing.   Cardiovascular: Positive for palpitations. Negative for chest pain and leg swelling.  Musculoskeletal: Positive for back pain and gait problem (poor balance, uses cane).       Legs ache  Neurological: Positive for weakness (legs) and numbness (legs and hands). Negative for dizziness and headaches.       Objective:   Vitals:   11/22/20 1057  BP: 132/86  Pulse: 75  Temp: 97.8 F (36.6 C)  SpO2: 96%   BP Readings from Last 3 Encounters:  11/22/20 132/86  05/25/20 138/72  03/24/20  124/78   Wt Readings from Last 3 Encounters:  11/22/20 125 lb (56.7 kg)  05/25/20 125 lb 9.6 oz (57 kg)  03/24/20 125 lb (56.7 kg)   Body mass index is 20.18 kg/m.  Depression screen [facility] 2/9 11/22/2020 09/23/2018 08/16/2016 08/17/2015 08/12/2014  Decreased Interest 0 0 0 2 0  Down, Depressed, Hopeless 0 0 0 1 0  PHQ - 2 Score 0 0 0 3 0  Altered sleeping 0 - - 0 -  Tired, decreased energy 3 - - 3 -  Change in appetite 3 - - 3 -  Feeling bad or failure about yourself  0 - - 1 -  Trouble concentrating 0 - - 0 -  Moving slowly or fidgety/restless 0 - - 0 -  Suicidal thoughts 0 - - 0 -  PHQ-9 Score 6 - - 10 -  Difficult doing work/chores Somewhat difficult - - Somewhat difficult -    GAD 7 : Generalized Anxiety Score 11/22/2020  Nervous, Anxious, on Edge 0  Control/stop worrying 0  Worry too much - different things 0  Trouble relaxing 0  Restless 0  Easily annoyed or irritable 0  Afraid - awful might happen 3  Total GAD 7 Score 3  Anxiety Difficulty Not difficult at all       Physical Exam    Constitutional: Appears well-developed and well-nourished. No distress.  HENT:  Head: Normocephalic and atraumatic.  Neck: Neck supple. No tracheal deviation present. No thyromegaly present.  No cervical lymphadenopathy Cardiovascular: Normal rate, irregular rhythm and normal heart sounds.   No murmur heard. No carotid bruit .  No edema Pulmonary/Chest: Effort normal and breath sounds normal. No respiratory distress. No has no wheezes. No rales.  Skin: Skin is warm and dry. Not diaphoretic.  Psychiatric: Normal mood and affect. Behavior is normal.      Assessment & Plan:   Screening for depression using PHQ-9 scale is positive. She denies any depression.  She does feel tired and has little energy and appetite.  She wonders if this is related to medication.  She declines any medication.   Screened for anxiety using GAD7 Scale.  No evidence of anxiety.  She does worry some about  something happening at home because she lives alone.  Other than that she does not feel anxious   See Problem List for Assessment and Plan of chronic medical problems.    This visit occurred during the SARS-CoV-2 public health emergency.  Safety protocols were in place, including screening questions prior to the visit, additional usage of staff PPE, and extensive cleaning of exam room while observing appropriate contact time as indicated for disinfecting solutions.

## 2020-11-22 ENCOUNTER — Encounter: Payer: Self-pay | Admitting: Internal Medicine

## 2020-11-22 ENCOUNTER — Ambulatory Visit (INDEPENDENT_AMBULATORY_CARE_PROVIDER_SITE_OTHER): Payer: Medicare Other | Admitting: Internal Medicine

## 2020-11-22 ENCOUNTER — Other Ambulatory Visit: Payer: Self-pay

## 2020-11-22 VITALS — BP 132/86 | HR 75 | Temp 97.8°F | Wt 125.0 lb

## 2020-11-22 DIAGNOSIS — E7800 Pure hypercholesterolemia, unspecified: Secondary | ICD-10-CM

## 2020-11-22 DIAGNOSIS — G629 Polyneuropathy, unspecified: Secondary | ICD-10-CM

## 2020-11-22 DIAGNOSIS — I10 Essential (primary) hypertension: Secondary | ICD-10-CM

## 2020-11-22 DIAGNOSIS — R29898 Other symptoms and signs involving the musculoskeletal system: Secondary | ICD-10-CM

## 2020-11-22 DIAGNOSIS — I4821 Permanent atrial fibrillation: Secondary | ICD-10-CM | POA: Diagnosis not present

## 2020-11-22 LAB — CBC WITH DIFFERENTIAL/PLATELET
Basophils Absolute: 0 10*3/uL (ref 0.0–0.1)
Basophils Relative: 1 % (ref 0.0–3.0)
Eosinophils Absolute: 0.1 10*3/uL (ref 0.0–0.7)
Eosinophils Relative: 3 % (ref 0.0–5.0)
HCT: 42.5 % (ref 36.0–46.0)
Hemoglobin: 14 g/dL (ref 12.0–15.0)
Lymphocytes Relative: 28.6 % (ref 12.0–46.0)
Lymphs Abs: 1 10*3/uL (ref 0.7–4.0)
MCHC: 33 g/dL (ref 30.0–36.0)
MCV: 92 fl (ref 78.0–100.0)
Monocytes Absolute: 0.4 10*3/uL (ref 0.1–1.0)
Monocytes Relative: 10.6 % (ref 3.0–12.0)
Neutro Abs: 1.9 10*3/uL (ref 1.4–7.7)
Neutrophils Relative %: 56.8 % (ref 43.0–77.0)
Platelets: 139 10*3/uL — ABNORMAL LOW (ref 150.0–400.0)
RBC: 4.62 Mil/uL (ref 3.87–5.11)
RDW: 13.7 % (ref 11.5–15.5)
WBC: 3.4 10*3/uL — ABNORMAL LOW (ref 4.0–10.5)

## 2020-11-22 LAB — LIPID PANEL
Cholesterol: 155 mg/dL (ref 0–200)
HDL: 61.7 mg/dL (ref >39.00)
LDL Cholesterol: 78 mg/dL (ref 0–99)
NonHDL: 92.93
Total CHOL/HDL Ratio: 3
Triglycerides: 73 mg/dL (ref 0.0–149.0)
VLDL: 14.6 mg/dL (ref 0.0–40.0)

## 2020-11-22 LAB — COMPREHENSIVE METABOLIC PANEL
ALT: 18 U/L (ref 0–35)
AST: 19 U/L (ref 0–37)
Albumin: 4.1 g/dL (ref 3.5–5.2)
Alkaline Phosphatase: 75 U/L (ref 39–117)
BUN: 17 mg/dL (ref 6–23)
CO2: 28 mEq/L (ref 19–32)
Calcium: 9.4 mg/dL (ref 8.4–10.5)
Chloride: 109 mEq/L (ref 96–112)
Creatinine, Ser: 0.85 mg/dL (ref 0.40–1.20)
GFR: 62.65 mL/min (ref >60.00)
Glucose, Bld: 88 mg/dL (ref 70–99)
Potassium: 4.2 mEq/L (ref 3.5–5.1)
Sodium: 143 mEq/L (ref 135–145)
Total Bilirubin: 0.7 mg/dL (ref 0.2–1.2)
Total Protein: 6.8 g/dL (ref 6.0–8.3)

## 2020-11-22 LAB — HEMOGLOBIN A1C: Hgb A1c MFr Bld: 6 % (ref 4.6–6.5)

## 2020-11-22 MED ORDER — GABAPENTIN 300 MG PO CAPS: 300.0000 mg | ORAL_CAPSULE | Freq: Two times a day (BID) | ORAL | 3 refills | Status: DC

## 2020-11-22 NOTE — Assessment & Plan Note (Signed)
Chronic Blood pressure well controlled Continue metoprolol 50 mg twice daily and amlodipine 5 mg daily CMP

## 2020-11-22 NOTE — Assessment & Plan Note (Signed)
Chronic Taking gabapentin 300 mg twice daily-?  Truly helping or not She uses a cane to ambulate and denies any falls Discussed that she can stop the gabapentin if she wants to see how much is helping or not-if helping okay to restart

## 2020-11-22 NOTE — Assessment & Plan Note (Signed)
Chronic Following with cardiology In [place]. fib here today, but asymptomatic Continue Eliquis 2.5 mg twice daily and metoprolol 50 mg twice daily CBC, CMP

## 2020-11-22 NOTE — Assessment & Plan Note (Signed)
Chronic Continue atorvastatin 20 mg daily Check lipid panel, CMP

## 2020-11-22 NOTE — Assessment & Plan Note (Signed)
Chronic Has seen neurology in the past and her weakness is multifactorial-chronic back pain, knee osteoarthritis and neuropathy She declines PT Uses cane to ambulate Denies falls since her last visit

## 2020-12-02 ENCOUNTER — Other Ambulatory Visit: Payer: Self-pay | Admitting: Internal Medicine

## 2020-12-06 ENCOUNTER — Telehealth: Payer: Self-pay | Admitting: Pharmacist

## 2020-12-06 NOTE — Progress Notes (Signed)
    Chronic Care Management Pharmacy Assistant   Name: [name]  MRN: [id number] DOB: 1934-03-22   Reason for Encounter: General Adherence Call    Recent office visits:  11/22/20 Dr. Quay Burow changed gabapentin 300 mg to 1 cap 2 times daily  Recent consult visits:  None ID  Hospital visits:  None in previous 6 months  Medications: Outpatient Encounter Medications as of 12/06/2020  Medication Sig  . amLODipine (NORVASC) 5 MG tablet Take 1 tablet (5 mg total) by mouth daily.  Marland Kitchen atorvastatin (LIPITOR) 20 MG tablet TAKE 1 TABLET BY MOUTH DAILY AT 6PM  . brimonidine (ALPHAGAN) 0.2 % ophthalmic solution Place 1 drop into the left eye 2 times daily.  . Calcium Carb-Cholecalciferol (CALCIUM-VITAMIN D3) 600-400 MG-UNIT CAPS Take 1 tablet by mouth every other day.  . dorzolamide-timolol (COSOPT) 22.3-6.8 MG/ML ophthalmic solution Place 1 drop into both eyes 2 times daily.  Marland Kitchen ELIQUIS 2.5 MG TABS tablet TAKE 1 TABLET BY MOUTH TWICE A DAY  . gabapentin (NEURONTIN) 300 MG capsule Take 1 capsule (300 mg total) by mouth 2 (two) times daily.  Marland Kitchen latanoprost (XALATAN) 0.005 % ophthalmic solution Place 1 drop into both eyes nightly.  . metoprolol tartrate (LOPRESSOR) 50 MG tablet TAKE 1 TABLET BY MOUTH TWICE A DAY  . pilocarpine (PILOCAR) 4 % ophthalmic solution Place 1 drop into the left eye 3 times daily.  . Vitamin D, Cholecalciferol, 50 MCG (2000 UT) CAPS Take 1 tablet by mouth every other day.  . [DISCONTINUED] potassium chloride (K-DUR) 10 MEQ tablet Take 1 tablet (10 mEq total) by mouth daily.   No facility-administered encounter medications on file as of 12/06/2020.   Pharmacist Review  Have you had any problems recently with your health? The patient states that she has not had any new since she last seen Dr. Quay Burow  Have you had any problems with your pharmacy? The patient states that she does not have any problems with getting medications or the cost of any medications from the  pharmacy  What issues or side effects are you having with your medications? The patient states that she does not have any side effects from medications  What would you like me to pass along to [facility] for them to help you with?  The patient states that she is doing ok, and that she does not have any concerns at this time about her health  What can we do to take care of you better? The patient stated that she does appreciate the call      Star Rating Drugs: Atorvastatin 20 mg 12/02/20 [place] Clinical Pharmacist Assistant [phone]  Time spent:20

## 2020-12-10 ENCOUNTER — Other Ambulatory Visit: Payer: Self-pay | Admitting: Cardiology

## 2020-12-12 NOTE — Progress Notes (Signed)
HPI: FU atrial fibrillation. She has a hx of s/p failed DCCV in 8/13, HTN, HL, tobacco abuse. Echo 01/2012: EF 55-65%, normal wall motion, grade 2 diastolic dysfunction, mild MR. Holter 10/14 showed afib, rate controlled. Since last seen,  she does have dyspnea on exertion but no orthopnea, PND, pedal edema, chest pain, palpitations, syncope or bleeding.  Current Outpatient Medications  Medication Sig Dispense Refill  . atorvastatin (LIPITOR) 20 MG tablet TAKE 1 TABLET BY MOUTH DAILY AT 6PM 90 tablet 1  . brimonidine (ALPHAGAN) 0.2 % ophthalmic solution Place 1 drop into the left eye 3 times daily.    . Calcium Carb-Cholecalciferol (CALCIUM-VITAMIN D3) 600-400 MG-UNIT CAPS Take 1 tablet by mouth every other day.    . dorzolamide-timolol (COSOPT) 22.3-6.8 MG/ML ophthalmic solution Place 1 drop into both eyes 2 times daily.    Marland Kitchen ELIQUIS 2.5 MG TABS tablet TAKE 1 TABLET BY MOUTH TWICE A DAY 180 tablet 1  . gabapentin (NEURONTIN) 300 MG capsule Take 1 capsule (300 mg total) by mouth 2 (two) times daily. 180 capsule 3  . latanoprost (XALATAN) 0.005 % ophthalmic solution Place 1 drop into both eyes nightly.    . metoprolol tartrate (LOPRESSOR) 50 MG tablet TAKE 1 TABLET BY MOUTH TWICE A DAY 180 tablet 3  . Vitamin D, Cholecalciferol, 50 MCG (2000 UT) CAPS Take 1 tablet by mouth every other day.     No current facility-administered medications for this visit.     Past Medical History:  Diagnosis Date  . Anxiety   . Atrial fibrillation ([place])   . Cigarette smoker   . Depression   . Diverticulosis of colon   . DJD (degenerative joint disease)   . Family history of colon cancer   . Glaucoma   . Hemorrhoids   . History of sebaceous cyst    R posterior scalp - observation elected 01/2015 after surgical eval  . Hyperlipidemia   . Hypertension   . Osteopenia   . Renal cyst   . Restless leg syndrome   . Vitamin D deficiency     Past Surgical History:  Procedure Laterality Date  .  CARDIOVERSION  04/23/2012   Procedure: CARDIOVERSION;  Surgeon: Hillary Bow, MD;  Location: [facility] ENDOSCOPY;  Service: Cardiovascular;  Laterality: N/A;  . CATARACT EXTRACTION    . COLONOSCOPY    . retina surgery x 2  2013  . TOTAL HIP ARTHROPLASTY  2/05   right by Dr. Tonita Cong  . UPPER GASTROINTESTINAL ENDOSCOPY      Social History   Socioeconomic History  . Marital status: Widowed    Spouse name: Not on file  . Number of children: 0  . Years of education: Not on file  . Highest education level: Not on file  Occupational History  . Occupation: Retired  Tobacco Use  . Smoking status: Light Tobacco Smoker    Packs/day: 0.30  . Smokeless tobacco: Never Used  . Tobacco comment: 4-5 cig a day  Substance and Sexual Activity  . Alcohol use: No    Alcohol/week: 0.0 standard drinks  . Drug use: No  . Sexual activity: Not Currently  Other Topics Concern  . Not on file  Social History Narrative   Exercises 2 times per week   Caffeine use: 1 cup coffee per day   Lives alone, widowed 1978   Social Determinants of Health   Financial Resource Strain: Low Risk   . Difficulty of Paying Living Expenses: Not very  hard  Food Insecurity: Not on file  Transportation Needs: Not on file  Physical Activity: Not on file  Stress: Not on file  Social Connections: Not on file  Intimate Partner Violence: Not on file    Family History  Problem Relation Age of Onset  . Hypertension Mother   . Diabetes Mother   . Colon cancer Brother   . Prostate cancer Father   . Breast cancer Other        niece  . Diabetes Brother   . Diabetes Sister     ROS: Bilateral leg pain but no fevers or chills, productive cough, hemoptysis, dysphasia, odynophagia, melena, hematochezia, dysuria, hematuria, rash, seizure activity, orthopnea, PND, pedal edema, claudication. Remaining systems are negative.  Physical Exam: Well-developed well-nourished in no acute distress.  Skin is warm and dry.  HEENT is  normal.  Neck is supple.  Chest is clear to auscultation with normal expansion.  Cardiovascular exam is irregular Abdominal exam nontender or distended. No masses palpated. Extremities show no edema. neuro grossly intact  ECG-atrial fibrillation with PVCs or aberrantly conducted beats, normal axis, left ventricular hypertrophy with repolarization abnormality.  Personally reviewed  A/P  1 permanent atrial fibrillation-continue beta-blocker for rate control.  Continue apixaban.  Check hemoglobin and renal function.  Repeat echocardiogram given history of mild mitral regurgitation.  2 hypertension-blood pressure elevated; add amlodipine 5 mg daily and follow.  Adjust as needed.  3 hyperlipidemia-continue statin.  4 tobacco abuse-we discussed importance of discontinuing.  Kirk Ruths, MD

## 2020-12-19 ENCOUNTER — Other Ambulatory Visit: Payer: Self-pay

## 2020-12-19 ENCOUNTER — Ambulatory Visit (INDEPENDENT_AMBULATORY_CARE_PROVIDER_SITE_OTHER): Payer: Medicare Other | Admitting: Cardiology

## 2020-12-19 ENCOUNTER — Encounter: Payer: Self-pay | Admitting: Cardiology

## 2020-12-19 VITALS — BP 178/108 | HR 86 | Ht 66.0 in | Wt 123.6 lb

## 2020-12-19 DIAGNOSIS — I4821 Permanent atrial fibrillation: Secondary | ICD-10-CM | POA: Diagnosis not present

## 2020-12-19 DIAGNOSIS — F17200 Nicotine dependence, unspecified, uncomplicated: Secondary | ICD-10-CM | POA: Diagnosis not present

## 2020-12-19 DIAGNOSIS — E7800 Pure hypercholesterolemia, unspecified: Secondary | ICD-10-CM | POA: Diagnosis not present

## 2020-12-19 DIAGNOSIS — I10 Essential (primary) hypertension: Secondary | ICD-10-CM | POA: Diagnosis not present

## 2020-12-19 MED ORDER — AMLODIPINE BESYLATE 5 MG PO TABS: 5.0000 mg | ORAL_TABLET | Freq: Every day | ORAL | 3 refills | Status: DC

## 2020-12-19 NOTE — Patient Instructions (Signed)
Medication Instructions:   START AMLODIPINE 5 MG ONCE DAILY  *If you need a refill on your cardiac medications before your next appointment, please call your pharmacy*  Testing/Procedures:  Your physician has requested that you have an echocardiogram. Echocardiography is a painless test that uses sound waves to create images of your heart. It provides your doctor with information about the size and shape of your heart and how well your heart's chambers and valves are working. This procedure takes approximately one hour. There are no restrictions for this procedure.[place]     Follow-Up: At [facility], you and your health needs are our priority.  As part of our continuing mission to provide you with exceptional heart care, we have created designated Provider Care Teams.  These Care Teams include your primary Cardiologist (physician) and Advanced Practice Providers (APPs -  Physician Assistants and Nurse Practitioners) who all work together to provide you with the care you need, when you need it.  We recommend signing up for the patient portal called "MyChart".  Sign up information is provided on this After Visit Summary.  MyChart is used to connect with patients for Virtual Visits (Telemedicine).  Patients are able to view lab/test results, encounter notes, upcoming appointments, etc.  Non-urgent messages can be sent to your provider as well.   To learn more about what you can do with MyChart, go to [email or web address].    Your next appointment:   12 month(s)  The format for your next appointment:   In Person  Provider:   Kirk Ruths, MD

## 2020-12-26 ENCOUNTER — Ambulatory Visit: Payer: Medicare Other

## 2020-12-26 ENCOUNTER — Telehealth: Payer: Self-pay | Admitting: Internal Medicine

## 2020-12-26 NOTE — Telephone Encounter (Signed)
LVM for pt to rtn my call to r/s appt with nha on 12/26/20 

## 2021-01-17 ENCOUNTER — Ambulatory Visit (HOSPITAL_COMMUNITY): Payer: Medicare Other | Attending: Cardiology

## 2021-01-17 ENCOUNTER — Other Ambulatory Visit: Payer: Self-pay

## 2021-01-17 DIAGNOSIS — I4821 Permanent atrial fibrillation: Secondary | ICD-10-CM | POA: Insufficient documentation

## 2021-01-17 LAB — ECHOCARDIOGRAM COMPLETE: S' Lateral: 3.2 cm

## 2021-01-17 MED ORDER — PERFLUTREN LIPID MICROSPHERE
1.0000 mL | INTRAVENOUS | Status: AC | PRN
  Administered 2021-01-17: 2 mL via INTRAVENOUS

## 2021-01-26 ENCOUNTER — Ambulatory Visit (INDEPENDENT_AMBULATORY_CARE_PROVIDER_SITE_OTHER): Payer: Medicare Other

## 2021-01-26 DIAGNOSIS — Z0000 Encounter for general adult medical examination without abnormal findings: Secondary | ICD-10-CM | POA: Diagnosis not present

## 2021-01-26 NOTE — Progress Notes (Signed)
I connected with [name] today by telephone and verified that I am speaking with the correct person using two identifiers. Location patient: home Location provider: work Persons participating in the virtual visit: Vanetta Rule and Lisette Abu, LPN..   I discussed the limitations, risks, security and privacy concerns of performing an evaluation and management service by telephone and the availability of in person appointments. I also discussed with the patient that there may be a patient responsible charge related to this service. The patient expressed understanding and verbally consented to this telephonic visit.    Interactive audio and video telecommunications were attempted between this provider and patient, however failed, due to patient having technical difficulties OR patient did not have access to video capability.  We continued and completed visit with audio only.  Some vital signs may be absent or patient reported.   Time Spent with patient on telephone encounter: 30 minutes  Subjective:   [name] is a 87 y.o. female who presents for Medicare Annual (Subsequent) preventive examination.  Review of Systems    No ROS. Medicare Wellness Visit. Additional risk factors are reflected in social history. Cardiac Risk Factors include: advanced age (>74men, >11 women);dyslipidemia;hypertension;family history of premature cardiovascular disease;smoking/ tobacco exposure     Objective:    There were no vitals filed for this visit. There is no height or weight on file to calculate BMI.  Advanced Directives 01/26/2021 08/16/2016 04/23/2012  Does Patient Have a Medical Advance Directive? Yes Yes Patient has advance directive, copy not in chart  Type of Advance Directive Living will Living will Living will  Does patient want to make changes to medical advance directive? No - Patient declined - -  Copy of [place] in Chart? - - Copy requested from  family  Pre-existing out of facility DNR order (yellow form or pink MOST form) - - No    Current Medications (verified) Outpatient Encounter Medications as of 01/26/2021  Medication Sig  . amLODipine (NORVASC) 5 MG tablet Take 1 tablet (5 mg total) by mouth daily.  Marland Kitchen atorvastatin (LIPITOR) 20 MG tablet TAKE 1 TABLET BY MOUTH DAILY AT 6PM  . brimonidine (ALPHAGAN) 0.2 % ophthalmic solution Place 1 drop into the left eye 3 times daily.  . Calcium Carb-Cholecalciferol (CALCIUM-VITAMIN D3) 600-400 MG-UNIT CAPS Take 1 tablet by mouth every other day.  . dorzolamide-timolol (COSOPT) 22.3-6.8 MG/ML ophthalmic solution Place 1 drop into both eyes 2 times daily.  Marland Kitchen ELIQUIS 2.5 MG TABS tablet TAKE 1 TABLET BY MOUTH TWICE A DAY  . gabapentin (NEURONTIN) 300 MG capsule Take 1 capsule (300 mg total) by mouth 2 (two) times daily.  Marland Kitchen latanoprost (XALATAN) 0.005 % ophthalmic solution Place 1 drop into both eyes nightly.  . metoprolol tartrate (LOPRESSOR) 50 MG tablet TAKE 1 TABLET BY MOUTH TWICE A DAY  . Vitamin D, Cholecalciferol, 50 MCG (2000 UT) CAPS Take 1 tablet by mouth every other day.   No facility-administered encounter medications on file as of 01/26/2021.    Allergies (verified) Patient has no known allergies.   History: Past Medical History:  Diagnosis Date  . Anxiety   . Atrial fibrillation ([place])   . Cigarette smoker   . Depression   . Diverticulosis of colon   . DJD (degenerative joint disease)   . Family history of colon cancer   . Glaucoma   . Hemorrhoids   . History of sebaceous cyst    R posterior scalp - observation elected 01/2015  after surgical eval  . Hyperlipidemia   . Hypertension   . Osteopenia   . Renal cyst   . Restless leg syndrome   . Vitamin D deficiency    Past Surgical History:  Procedure Laterality Date  . CARDIOVERSION  04/23/2012   Procedure: CARDIOVERSION;  Surgeon: Hillary Bow, MD;  Location: [facility] ENDOSCOPY;  Service: Cardiovascular;  Laterality: N/A;   . CATARACT EXTRACTION    . COLONOSCOPY    . retina surgery x 2  2013  . TOTAL HIP ARTHROPLASTY  2/05   right by Dr. Tonita Cong  . UPPER GASTROINTESTINAL ENDOSCOPY     Family History  Problem Relation Age of Onset  . Hypertension Mother   . Diabetes Mother   . Colon cancer Brother   . Prostate cancer Father   . Breast cancer Other        niece  . Diabetes Brother   . Diabetes Sister    Social History   Socioeconomic History  . Marital status: Widowed    Spouse name: Not on file  . Number of children: 0  . Years of education: Not on file  . Highest education level: Not on file  Occupational History  . Occupation: Retired  Tobacco Use  . Smoking status: Light Tobacco Smoker    Packs/day: 0.30  . Smokeless tobacco: Never Used  . Tobacco comment: 4-5 cig a day  Substance and Sexual Activity  . Alcohol use: No    Alcohol/week: 0.0 standard drinks  . Drug use: No  . Sexual activity: Not Currently  Other Topics Concern  . Not on file  Social History Narrative   Exercises 2 times per week   Caffeine use: 1 cup coffee per day   Lives alone, widowed 1978   Social Determinants of Health   Financial Resource Strain: Low Risk   . Difficulty of Paying Living Expenses: Not very hard  Food Insecurity: No Food Insecurity  . Worried About Charity fundraiser in the Last Year: Never true  . Ran Out of Food in the Last Year: Never true  Transportation Needs: No Transportation Needs  . Lack of Transportation (Medical): No  . Lack of Transportation (Non-Medical): No  Physical Activity: Inactive  . Days of Exercise per Week: 0 days  . Minutes of Exercise per Session: 0 min  Stress: No Stress Concern Present  . Feeling of Stress : Not at all  Social Connections: Socially Isolated  . Frequency of Communication with Friends and Family: More than three times a week  . Frequency of Social Gatherings with Friends and Family: Once a week  . Attends Religious Services: Never  . Active  Member of Clubs or Organizations: No  . Attends Archivist Meetings: Never  . Marital Status: Widowed    Tobacco Counseling Ready to quit: Not Answered Counseling given: Not Answered Comment: 4-5 cig a day   Clinical Intake:  Pre-visit preparation completed: Yes  Pain : No/denies pain     Nutritional Risks: None Diabetes: No  How often do you need to have someone help you when you read instructions, pamphlets, or other written materials from your doctor or pharmacy?: 1 - Never What is the last grade level you completed in school?: High School Graduate  Diabetic? no  Interpreter Needed?: No  Information entered by :: Lisette Abu, LPN   Activities of Daily Living In your present state of health, do you have any difficulty performing the following activities: 01/26/2021  Hearing?  N  Vision? N  Difficulty concentrating or making decisions? N  Walking or climbing stairs? N  Dressing or bathing? N  Doing errands, shopping? N  Preparing Food and eating ? N  Using the Toilet? N  In the past six months, have you accidently leaked urine? N  Do you have problems with loss of bowel control? N  Managing your Medications? N  Managing your Finances? N  Housekeeping or managing your Housekeeping? N  Some recent data might be hidden    Patient Care Team: Binnie Rail, MD as PCP - General (Internal Medicine) Stanford Breed Denice Bors, MD as PCP - Cardiology (Cardiology) Lafayette Dragon, MD (Inactive) (Gastroenterology) Lelon Perla, MD (Cardiology) Susa Day, MD (Orthopedic Surgery) Bond, Tracie Harrier, MD (Ophthalmology) Erroll Luna, MD (General Surgery) Charlton Haws, [facility] as Pharmacist (Pharmacist)  Indicate any recent Medical Services you may have received from other than [facility] providers in the past year (date may be approximate).     Assessment:   This is a routine wellness examination for [facility].  Hearing/Vision screen No exam data  present  Dietary issues and exercise activities discussed: Current Exercise Habits: The patient does not participate in regular exercise at present, Exercise limited by: respiratory conditions(s);orthopedic condition(s)  Goals Addressed   None    Depression Screen PHQ 2/9 Scores 01/26/2021 11/22/2020 09/23/2018 08/16/2016 08/17/2015 08/12/2014  PHQ - 2 Score 0 0 0 0 3 0  PHQ- 9 Score - 6 - - 10 -    Fall Risk Fall Risk  01/26/2021 11/22/2020 11/23/2019 09/23/2018 03/26/2018  Falls in the past year? 0 0 0 0 No  Comment - - - - Emmi Telephone Survey: data to providers prior to load  Number falls in past yr: 0 - 0 - -  Comment - - - - -  Injury with Fall? 0 - 0 - -  Risk for fall due to : No Fall Risks - - - -  Follow up - Falls evaluation completed - - -    FALL RISK PREVENTION PERTAINING TO THE HOME:  Any stairs in or around the home? No  If so, are there any without handrails? No  Home free of loose throw rugs in walkways, pet beds, electrical cords, etc? Yes  Adequate lighting in your home to reduce risk of falls? Yes   ASSISTIVE DEVICES UTILIZED TO PREVENT FALLS:  Life alert? No  Use of a cane, walker or w/c? Yes  Grab bars in the bathroom? No  Shower chair or bench in shower? Yes  Elevated toilet seat or a handicapped toilet? Yes   TIMED UP AND GO:  Was the test performed? No .  Length of time to ambulate 10 feet: 0 sec.   Gait steady and fast without use of assistive device  Cognitive Function: MMSE - Mini Mental State Exam 08/16/2016  Orientation to time 4  Orientation to Place 5  Registration 3  Attention/ Calculation 3  Recall 3  Language- name 2 objects 2  Language- repeat 1  Language- follow 3 step command 3  Language- read & follow direction 1  Write a sentence 1  Copy design 1  Total score 27        Immunizations Immunization History  Administered Date(s) Administered  . Fluad Quad(high Dose 65+) 05/14/2019, 05/25/2020  . Influenza Split  06/22/2011  . Influenza Whole 06/20/2009, 06/21/2010, 06/18/2012  . Influenza, High Dose Seasonal PF 06/10/2017  . Influenza,inj,Quad PF,6+ Mos 06/18/2013, 06/23/2014, 05/27/2015  .  Influenza-Unspecified 05/27/2016, 06/27/2018  . PFIZER(Purple Top)SARS-COV-2 Vaccination 10/20/2019, 11/10/2019  . Pneumococcal Conjugate-13 08/12/2014  . Pneumococcal Polysaccharide-23 08/17/2015  . Td 08/12/2014  . Zoster, Live 05/27/2016    TDAP status: Up to date  Flu Vaccine status: Up to date  Pneumococcal vaccine status: Up to date  Covid-19 vaccine status: Completed vaccines  Qualifies for Shingles Vaccine? Yes   Zostavax completed Yes   Shingrix Completed?: No.    Education has been provided regarding the importance of this vaccine. Patient has been advised to call insurance company to determine out of pocket expense if they have not yet received this vaccine. Advised may also receive vaccine at local pharmacy or Health Dept. Verbalized acceptance and understanding.  Screening Tests Health Maintenance  Topic Date Due  . Zoster Vaccines- Shingrix (1 of 2) Never done  . DEXA SCAN  09/25/2019  . COVID-19 Vaccine (3 - Booster for Pfizer series) 04/11/2020  . INFLUENZA VACCINE  03/27/2021  . TETANUS/TDAP  08/12/2024  . PNA vac Low Risk Adult  Completed  . HPV VACCINES  Aged Out    Health Maintenance  Health Maintenance Due  Topic Date Due  . Zoster Vaccines- Shingrix (1 of 2) Never done  . DEXA SCAN  09/25/2019  . COVID-19 Vaccine (3 - Booster for Pfizer series) 04/11/2020    Colorectal cancer screening: No longer required.   Mammogram status: No longer required due to age.  Bone Density status: Completed 09/24/2016. Results reflect: Bone density results: OSTEOPENIA. Repeat every 2-3 years.  Lung Cancer Screening: (Low Dose CT Chest recommended if Age 91-80 years, 30 pack-year currently smoking OR have quit w/in 15years.) does not qualify.   Lung Cancer Screening Referral:  no  Additional Screening:  Hepatitis C Screening: does not qualify; Completed no  Vision Screening: Recommended annual ophthalmology exams for early detection of glaucoma and other disorders of the eye. Is the patient up to date with their annual eye exam?  Yes  Who is the provider or what is the name of the office in which the patient attends annual eye exams? Raynelle Fanning, MD. If pt is not established with a provider, would they like to be referred to a provider to establish care? No .   Dental Screening: Recommended annual dental exams for proper oral hygiene  Community Resource Referral / Chronic Care Management: CRR required this visit?  No   CCM required this visit?  No      Plan:     I have personally reviewed and noted the following in the patient's chart:   . Medical and social history . Use of alcohol, tobacco or illicit drugs  . Current medications and supplements including opioid prescriptions.  . Functional ability and status . Nutritional status . Physical activity . Advanced directives . List of other physicians . Hospitalizations, surgeries, and ER visits in previous 12 months . Vitals . Screenings to include cognitive, depression, and falls . Referrals and appointments  In addition, I have reviewed and discussed with patient certain preventive protocols, quality metrics, and best practice recommendations. A written personalized care plan for preventive services as well as general preventive health recommendations were provided to patient.     Karen Flow, LPN   10/27/3555   Nurse Notes:  Patient is cogitatively intact. There were no vitals filed for this visit. There is no height or weight on file to calculate BMI. Medications reviewed with patient; no opioid use noted.

## 2021-01-26 NOTE — Patient Instructions (Signed)
[name] , Thank you for taking time to come for your Medicare Wellness Visit. I appreciate your ongoing commitment to your health goals. Please review the following plan we discussed and let me know if I can assist you in the future.   Screening recommendations/referrals: Colonoscopy: no repeat due to age 87: 3/28/25012 Bone Density: 09/24/2016 Recommended yearly ophthalmology/optometry visit for glaucoma screening and checkup Recommended yearly dental visit for hygiene and checkup  Vaccinations: Influenza vaccine: 05/25/2020 Pneumococcal vaccine: 12/17/20158, 08/17/2015 Tdap vaccine: 08/12/2014 Shingles vaccine: never done   Covid-19: 10/20/2019, 11/10/2019  Advanced directives: Please bring a copy of your health care power of attorney and living will to the office at your convenience.  Conditions/risks identified: Yes; Reviewed health maintenance screenings with patient today and relevant education, vaccines, and/or referrals were provided. Please continue to do your personal lifestyle choices by: daily care of teeth and gums, regular physical activity (goal should be 5 days a week for 30 minutes), eat a healthy diet, avoid tobacco and drug use, limiting any alcohol intake, taking a low-dose aspirin (if not allergic or have been advised by your provider otherwise) and taking vitamins and minerals as recommended by your provider. Continue doing brain stimulating activities (puzzles, reading, adult coloring books, staying active) to keep memory sharp. Continue to eat heart healthy diet (full of fruits, vegetables, whole grains, lean protein, water--limit salt, fat, and sugar intake) and increase physical activity as tolerated.  Next appointment: Please schedule your next Medicare Wellness Visit with your Nurse Health Advisor in 1 year by calling [phone].   Preventive Care 60 Years and Older, Female Preventive care refers to lifestyle choices and visits with your health care  provider that can promote health and wellness. What does preventive care include?  A yearly physical exam. This is also called an annual well check.  Dental exams once or twice a year.  Routine eye exams. Ask your health care provider how often you should have your eyes checked.  Personal lifestyle choices, including:  Daily care of your teeth and gums.  Regular physical activity.  Eating a healthy diet.  Avoiding tobacco and drug use.  Limiting alcohol use.  Practicing safe sex.  Taking low-dose aspirin every day.  Taking vitamin and mineral supplements as recommended by your health care provider. What happens during an annual well check? The services and screenings done by your health care provider during your annual well check will depend on your age, overall health, lifestyle risk factors, and family history of disease. Counseling  Your health care provider may ask you questions about your:  Alcohol use.  Tobacco use.  Drug use.  Emotional well-being.  Home and relationship well-being.  Sexual activity.  Eating habits.  History of falls.  Memory and ability to understand (cognition).  Work and work Statistician.  Reproductive health. Screening  You may have the following tests or measurements:  Height, weight, and BMI.  Blood pressure.  Lipid and cholesterol levels. These may be checked every 5 years, or more frequently if you are over 3 years old.  Skin check.  Lung cancer screening. You may have this screening every year starting at age 51 if you have a 30-pack-year history of smoking and currently smoke or have quit within the past 15 years.  Fecal occult blood test (FOBT) of the stool. You may have this test every year starting at age 59.  Flexible sigmoidoscopy or colonoscopy. You may have a sigmoidoscopy every 5 years or a colonoscopy every 10  years starting at age 58.  Hepatitis C blood test.  Hepatitis B blood test.  Sexually  transmitted disease (STD) testing.  Diabetes screening. This is done by checking your blood sugar (glucose) after you have not eaten for a while (fasting). You may have this done every 1-3 years.  Bone density scan. This is done to screen for osteoporosis. You may have this done starting at age 85.  Mammogram. This may be done every 1-2 years. Talk to your health care provider about how often you should have regular mammograms. Talk with your health care provider about your test results, treatment options, and if necessary, the need for more tests. Vaccines  Your health care provider may recommend certain vaccines, such as:  Influenza vaccine. This is recommended every year.  Tetanus, diphtheria, and acellular pertussis (Tdap, Td) vaccine. You may need a Td booster every 10 years.  Zoster vaccine. You may need this after age 97.  Pneumococcal 13-valent conjugate (PCV13) vaccine. One dose is recommended after age 84.  Pneumococcal polysaccharide (PPSV23) vaccine. One dose is recommended after age 65. Talk to your health care provider about which screenings and vaccines you need and how often you need them. This information is not intended to replace advice given to you by your health care provider. Make sure you discuss any questions you have with your health care provider. Document Released: 09/09/2015 Document Revised: 05/02/2016 Document Reviewed: 06/14/2015 Elsevier Interactive Patient Education  2017 [place] Prevention in the Home Falls can cause injuries. They can happen to people of all ages. There are many things you can do to make your home safe and to help prevent falls. What can I do on the outside of my home?  Regularly fix the edges of walkways and driveways and fix any cracks.  Remove anything that might make you trip as you walk through a door, such as a raised step or threshold.  Trim any bushes or trees on the path to your home.  Use bright outdoor  lighting.  Clear any walking paths of anything that might make someone trip, such as rocks or tools.  Regularly check to see if handrails are loose or broken. Make sure that both sides of any steps have handrails.  Any raised decks and porches should have guardrails on the edges.  Have any leaves, snow, or ice cleared regularly.  Use sand or salt on walking paths during winter.  Clean up any spills in your garage right away. This includes oil or grease spills. What can I do in the bathroom?  Use night lights.  Install grab bars by the toilet and in the tub and shower. Do not use towel bars as grab bars.  Use non-skid mats or decals in the tub or shower.  If you need to sit down in the shower, use a plastic, non-slip stool.  Keep the floor dry. Clean up any water that spills on the floor as soon as it happens.  Remove soap buildup in the tub or shower regularly.  Attach bath mats securely with double-sided non-slip rug tape.  Do not have throw rugs and other things on the floor that can make you trip. What can I do in the bedroom?  Use night lights.  Make sure that you have a light by your bed that is easy to reach.  Do not use any sheets or blankets that are too big for your bed. They should not hang down onto the floor.  Have  a firm chair that has side arms. You can use this for support while you get dressed.  Do not have throw rugs and other things on the floor that can make you trip. What can I do in the kitchen?  Clean up any spills right away.  Avoid walking on wet floors.  Keep items that you use a lot in easy-to-reach places.  If you need to reach something above you, use a strong step stool that has a grab bar.  Keep electrical cords out of the way.  Do not use floor polish or wax that makes floors slippery. If you must use wax, use non-skid floor wax.  Do not have throw rugs and other things on the floor that can make you trip. What can I do with my  stairs?  Do not leave any items on the stairs.  Make sure that there are handrails on both sides of the stairs and use them. Fix handrails that are broken or loose. Make sure that handrails are as long as the stairways.  Check any carpeting to make sure that it is firmly attached to the stairs. Fix any carpet that is loose or worn.  Avoid having throw rugs at the top or bottom of the stairs. If you do have throw rugs, attach them to the floor with carpet tape.  Make sure that you have a light switch at the top of the stairs and the bottom of the stairs. If you do not have them, ask someone to add them for you. What else can I do to help prevent falls?  Wear shoes that:  Do not have high heels.  Have rubber bottoms.  Are comfortable and fit you well.  Are closed at the toe. Do not wear sandals.  If you use a stepladder:  Make sure that it is fully opened. Do not climb a closed stepladder.  Make sure that both sides of the stepladder are locked into place.  Ask someone to hold it for you, if possible.  Clearly mark and make sure that you can see:  Any grab bars or handrails.  First and last steps.  Where the edge of each step is.  Use tools that help you move around (mobility aids) if they are needed. These include:  Canes.  Walkers.  Scooters.  Crutches.  Turn on the lights when you go into a dark area. Replace any light bulbs as soon as they burn out.  Set up your furniture so you have a clear path. Avoid moving your furniture around.  If any of your floors are uneven, fix them.  If there are any pets around you, be aware of where they are.  Review your medicines with your doctor. Some medicines can make you feel dizzy. This can increase your chance of falling. Ask your doctor what other things that you can do to help prevent falls. This information is not intended to replace advice given to you by your health care provider. Make sure you discuss any  questions you have with your health care provider. Document Released: 06/09/2009 Document Revised: 01/19/2016 Document Reviewed: 09/17/2014 Elsevier Interactive Patient Education  2017 [employer].

## 2021-02-08 ENCOUNTER — Telehealth: Payer: Self-pay | Admitting: Pharmacist

## 2021-02-08 NOTE — Progress Notes (Signed)
    Chronic Care Management Pharmacy Assistant   Name: [name]  MRN: [id number] DOB: 1934/01/09  Reason for Encounter: Disease State - General Adherence    Recent office visits:  None noted  Recent consult visits:  12/19/20 [facility] (Cardiology) - F/u Permanent atrial fibrillation. Stop Potassium Chloride. EKG done.  Hospital visits:  None in previous 6 months  Medications: Outpatient Encounter Medications as of 02/08/2021  Medication Sig   amLODipine (NORVASC) 5 MG tablet Take 1 tablet (5 mg total) by mouth daily.   atorvastatin (LIPITOR) 20 MG tablet TAKE 1 TABLET BY MOUTH DAILY AT 6PM   brimonidine (ALPHAGAN) 0.2 % ophthalmic solution Place 1 drop into the left eye 3 times daily.   Calcium Carb-Cholecalciferol (CALCIUM-VITAMIN D3) 600-400 MG-UNIT CAPS Take 1 tablet by mouth every other day.   dorzolamide-timolol (COSOPT) 22.3-6.8 MG/ML ophthalmic solution Place 1 drop into both eyes 2 times daily.   ELIQUIS 2.5 MG TABS tablet TAKE 1 TABLET BY MOUTH TWICE A DAY   gabapentin (NEURONTIN) 300 MG capsule Take 1 capsule (300 mg total) by mouth 2 (two) times daily.   latanoprost (XALATAN) 0.005 % ophthalmic solution Place 1 drop into both eyes nightly.   metoprolol tartrate (LOPRESSOR) 50 MG tablet TAKE 1 TABLET BY MOUTH TWICE A DAY   Vitamin D, Cholecalciferol, 50 MCG (2000 UT) CAPS Take 1 tablet by mouth every other day.   No facility-administered encounter medications on file as of 02/08/2021.    Have you had any problems recently with your health? Patient states no problems with health.  Have you had any problems with your pharmacy? Patient states she has problems getting her refills especially her eye drops.    What issues or side effects are you having with your medications? Patient states no side effects or issues.   What would you like me to pass along to [facility] for them to help you with?  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    What can we do to take care of you better? Patient states nothing at this time.  Star Rating Drugs: Atorvastatin - last fill 01/09/21 [place], RMA Clinical Pharmacists Assistant [phone]  Time Spent: 64

## 2021-02-24 ENCOUNTER — Other Ambulatory Visit: Payer: Self-pay | Admitting: Cardiology

## 2021-02-24 NOTE — Telephone Encounter (Signed)
Prescription refill request for Eliquis received. Indication:atrial fib Last office visit:4/25 Scr:0.8 Age: 87 Weight:56.1 kg  Prescription refilled

## 2021-03-02 ENCOUNTER — Telehealth: Payer: Medicare Other

## 2021-04-11 ENCOUNTER — Telehealth: Payer: Self-pay | Admitting: Pharmacist

## 2021-04-11 NOTE — Progress Notes (Signed)
    Chronic Care Management Pharmacy Assistant   Name: [name]  MRN: [id number] DOB: 10-28-1933  Reason for Encounter: Disease State - General Adherence   Recent office visits:  None listed  Recent consult visits:  None listed  Hospital visits:  None in previous 6 months  Medications: Outpatient Encounter Medications as of 04/11/2021  Medication Sig   amLODipine (NORVASC) 5 MG tablet Take 1 tablet (5 mg total) by mouth daily.   atorvastatin (LIPITOR) 20 MG tablet TAKE 1 TABLET BY MOUTH DAILY AT 6PM   brimonidine (ALPHAGAN) 0.2 % ophthalmic solution Place 1 drop into the left eye 3 times daily.   Calcium Carb-Cholecalciferol (CALCIUM-VITAMIN D3) 600-400 MG-UNIT CAPS Take 1 tablet by mouth every other day.   dorzolamide-timolol (COSOPT) 22.3-6.8 MG/ML ophthalmic solution Place 1 drop into both eyes 2 times daily.   ELIQUIS 2.5 MG TABS tablet TAKE 1 TABLET BY MOUTH TWICE A DAY   gabapentin (NEURONTIN) 300 MG capsule Take 1 capsule (300 mg total) by mouth 2 (two) times daily.   latanoprost (XALATAN) 0.005 % ophthalmic solution Place 1 drop into both eyes nightly.   metoprolol tartrate (LOPRESSOR) 50 MG tablet TAKE 1 TABLET BY MOUTH TWICE A DAY   Vitamin D, Cholecalciferol, 50 MCG (2000 UT) CAPS Take 1 tablet by mouth every other day.   No facility-administered encounter medications on file as of 04/11/2021.    Have you had any problems recently with your health? Patient states no problems with health. Patient states she had her BP checked by a home health nurse and it was normal this time.  Have you had any problems with your pharmacy? Patient states she has problems getting her refills especially for her eye drops if its early she has to pay full price. Patient states she uses more sometimes when she misses her eye.    What issues or side effects are you having with your medications? Patient states no side effects or issues.   What would you like me to pass along to  [facility] for them to help you with?  Patient states she is interested in using [facility], she was excited to hear that they deliver because she pays people to go pick up her meds for her.    What can we do to take care of you better? Patient states nothing at this time. Patient states she is aware of her upcoming appt. In Sept. with Dr. Quay Burow.  Star Rating Drugs: Atorvastatin - last fill 04/05/21 [place], RMA Clinical Pharmacists Assistant [phone]  Time Spent: [phone]

## 2021-05-10 ENCOUNTER — Other Ambulatory Visit: Payer: Self-pay | Admitting: Cardiology

## 2021-05-15 ENCOUNTER — Ambulatory Visit: Payer: Medicare Other

## 2021-05-15 NOTE — Progress Notes (Unsigned)
Chronic Care Management Pharmacy Note  05/15/2021 Name:  [name] MRN:  [id number] DOB:  [birth year]  Summary: ***  Recommendations/Changes made from today's visit: ***  Plan: ***   Subjective: [name] is an 87 y.o. year old female who is a primary patient of Burns, Claudina Lick, MD.  The CCM team was consulted for assistance with disease management and care coordination needs.    {CCMTELEPHONEFACETOFACE:21091510} for {CCMINITIALFOLLOWUPCHOICE:21091511} in response to provider referral for pharmacy case management and/or care coordination services.   Consent to Services:  {CCMCONSENTOPTIONS:25074}  Patient Care Team: Binnie Rail, MD as PCP - General (Internal Medicine) Stanford Breed Denice Bors, MD as PCP - Cardiology (Cardiology) Lafayette Dragon, MD (Inactive) (Gastroenterology) Stanford Breed Denice Bors, MD (Cardiology) Susa Day, MD (Orthopedic Surgery) Bond, Tracie Harrier, MD (Ophthalmology) Erroll Luna, MD (General Surgery) Charlton Haws, [facility] as Pharmacist (Pharmacist)  Recent office visits: 11/22/20 Dr Quay Burow OV: chronic f/u; can stop gabapentin to see if truly helping; denies PT for weakness  Recent consult visits: 04/03/21 Dr Edilia Bo (eye dr): glaucoma eval  12/19/20 Dr Stanford Breed (cardiology): f/u AFIB; hx failed DCCV 03/2012. No med changes.  Hospital visits: None in previous 6 months   Objective:  Lab Results  Component Value Date   CREATININE 0.85 11/22/2020   BUN 17 11/22/2020   GFR 62.65 11/22/2020   GFRNONAA 59 (L) 05/25/2020   GFRAA 68 05/25/2020   NA 143 11/22/2020   K 4.2 11/22/2020   CALCIUM 9.4 11/22/2020   CO2 28 11/22/2020   GLUCOSE 88 11/22/2020    Lab Results  Component Value Date/Time   HGBA1C 6.0 11/22/2020 11:33 AM   GFR 62.65 11/22/2020 11:33 AM   GFR 76.06 11/23/2019 11:37 AM    Last diabetic Eye exam: No results found for: HMDIABEYEEXA  Last diabetic Foot exam: No results found for: HMDIABFOOTEX   Lab Results   Component Value Date   CHOL 155 11/22/2020   HDL 61.70 11/22/2020   LDLCALC 78 11/22/2020   TRIG 73.0 11/22/2020   CHOLHDL 3 11/22/2020    Hepatic Function Latest Ref Rng & Units 11/22/2020 05/25/2020 11/23/2019  Total Protein 6.0 - 8.3 g/dL 6.8 6.6 6.5  Albumin 3.5 - 5.2 g/dL 4.1 - 4.0  AST 0 - 37 U/L _0 ALT 0 - 35 U/L _1 Alk Phosphatase 39 - 117 U/L 75 - 69  Total Bilirubin 0.2 - 1.2 mg/dL 0.7 0.8 0.8  Bilirubin, Direct 0.0 - 0.3 mg/dL - - -    Lab Results  Component Value Date/Time   TSH 0.85 03/24/2019 11:37 AM   TSH 0.93 09/24/2016 10:24 AM   FREET4 0.76 06/21/2010 09:54 AM   FREET4 0.7 12/24/2007 10:51 AM    CBC Latest Ref Rng & Units 11/22/2020 05/25/2020 11/23/2019  WBC 4.0 - 10.5 K/uL 3.4(L) 4.0 3.7(L)  Hemoglobin 12.0 - 15.0 g/dL 14.0 13.8 13.2  Hematocrit 36.0 - 46.0 % 42.5 42.0 40.1  Platelets 150.0 - 400.0 K/uL 139.0(L) 141 132.0(L)    Lab Results  Component Value Date/Time   VD25OH 33 06/22/2013 09:32 AM   VD25OH 31 12/19/2011 10:29 AM    Clinical ASCVD: No  The ASCVD Risk score (Arnett DK, et al., 2019) failed to calculate for the following reasons:   The 2019 ASCVD risk score is only valid for ages 89 to 30    Depression screen PHQ 2/9 01/26/2021 11/22/2020 09/23/2018  Decreased Interest 0 0 0  Down, Depressed, Hopeless  0 0 0  PHQ - 2 Score 0 0 0  Altered sleeping - 0 -  Tired, decreased energy - 3 -  Change in appetite - 3 -  Feeling bad or failure about yourself  - 0 -  Trouble concentrating - 0 -  Moving slowly or fidgety/restless - 0 -  Suicidal thoughts - 0 -  PHQ-9 Score - 6 -  Difficult doing work/chores - Somewhat difficult -    CHA2DS2-VASc Score = 4  The patient's score is based upon: CHF History: 0 HTN History: 1 Diabetes History: 0 Stroke History: 0 Vascular Disease History: 0 Age Score: 2 Gender Score: 1  {Confirm score is correct.  If not, click here to update score.  REFRESH note.  :1}    Social History    Tobacco Use  Smoking Status Light Smoker   Packs/day: 0.30   Types: Cigarettes  Smokeless Tobacco Never  Tobacco Comments   4-5 cig a day   BP Readings from Last 3 Encounters:  12/19/20 (!) 178/108  11/22/20 132/86  05/25/20 138/72   Pulse Readings from Last 3 Encounters:  12/19/20 86  11/22/20 75  05/25/20 87   Wt Readings from Last 3 Encounters:  12/19/20 123 lb 9.6 oz (56.1 kg)  11/22/20 125 lb (56.7 kg)  05/25/20 125 lb 9.6 oz (57 kg)   BMI Readings from Last 3 Encounters:  12/19/20 19.95 kg/m  11/22/20 20.18 kg/m  05/25/20 20.27 kg/m    Assessment/Interventions: Review of patient past medical history, allergies, medications, health status, including review of consultants reports, laboratory and other test data, was performed as part of comprehensive evaluation and provision of chronic care management services.   SDOH:  (Social Determinants of Health) assessments and interventions performed: Yes  SDOH Screenings   Alcohol Screen: Low Risk    Last Alcohol Screening Score (AUDIT): 0  Depression (PHQ2-9): Low Risk    PHQ-2 Score: 0  Financial Resource Strain: Not on file  Food Insecurity: No Food Insecurity   Worried About Charity fundraiser in the Last Year: Never true   Ran Out of Food in the Last Year: Never true  Housing: [place] Risk Score: 0  Physical Activity: Inactive   Days of Exercise per Week: 0 days   Minutes of Exercise per Session: 0 min  Social Connections: Socially Isolated   Frequency of Communication with Friends and Family: More than three times a week   Frequency of Social Gatherings with Friends and Family: Once a week   Attends Religious Services: Never   Marine scientist or Organizations: No   Attends Archivist Meetings: Never   Marital Status: Widowed  Stress: No Stress Concern Present   Feeling of Stress : Not at all  Tobacco Use: High Risk   Smoking Tobacco Use: Light Smoker   Smokeless  Tobacco Use: Never  Transportation Needs: No Transportation Needs   Lack of Transportation (Medical): No   Lack of Transportation (Non-Medical): No    CCM Care Plan  No Known Allergies  Medications Reviewed Today     Reviewed by Sheral Flow, LPN (Licensed Practical Nurse) on 01/26/21 at 29  Med List Status: <None>   Medication Order Taking? Sig Documenting Provider Last Dose Status Informant  amLODipine (NORVASC) 5 MG tablet [id number]  Take 1 tablet (5 mg total) by mouth daily. Lelon Perla, MD  Active   atorvastatin (LIPITOR) 20 MG tablet [id number] No TAKE  1 TABLET BY MOUTH DAILY AT Robinette Haines, MD Taking Active   brimonidine [facility]) 0.2 % ophthalmic solution [id number] No Place 1 drop into the left eye 3 times daily. [provider] Taking Active   Calcium Carb-Cholecalciferol (CALCIUM-VITAMIN D3) 600-400 MG-UNIT CAPS [id number] No Take 1 tablet by mouth every other day. [provider] Taking Active   dorzolamide-timolol (COSOPT) 22.3-6.8 MG/ML ophthalmic solution [id number] No Place 1 drop into both eyes 2 times daily. [provider] Taking Active   ELIQUIS 2.5 MG TABS tablet [id number] No TAKE 1 TABLET BY MOUTH TWICE A DAY Crenshaw, Denice Bors, MD Taking Active   gabapentin (NEURONTIN) 300 MG capsule [id number] No Take 1 capsule (300 mg total) by mouth 2 (two) times daily. Binnie Rail, MD Taking Active   latanoprost (XALATAN) 0.005 % ophthalmic solution [id number] No Place 1 drop into both eyes nightly. [provider] Taking Active   metoprolol tartrate (LOPRESSOR) 50 MG tablet [id number] No TAKE 1 TABLET BY MOUTH TWICE A DAY Crenshaw, Denice Bors, MD Taking Active   Vitamin D, Cholecalciferol, 50 MCG (2000 UT) CAPS [id number] No Take 1 tablet by mouth every other day. [provider] Taking Active             Patient Active Problem List   Diagnosis Date Noted   COPD (chronic obstructive pulmonary disease) ([place])  12/01/2019   DOE (dyspnea on exertion) 11/23/2019   Neuropathy 07/03/2018   Low back pain 06/03/2018   Rectal prolapse, small 03/25/2018   Leg weakness, bilateral 03/25/2018   Hemorrhoids 09/25/2017   Arthritis of sternoclavicular joint 06/10/2017   Cervical radiculopathy 06/10/2017   Thyroid nodule 03/25/2017   Numbness in both hands 09/24/2016   Poor balance 09/24/2016   Irritable bowel syndrome (IBS) 02/24/2015   Long term current use of anticoagulant therapy 02/07/2012   Atrial fibrillation ([place]) 02/01/2012   S/P dilatation of esophageal stricture 12/25/2010   Osteopenia of the elderly 06/23/2010   Vitamin D deficiency 06/20/2009   RENAL CYST 12/20/2008   CIGARETTE SMOKER 12/22/2007   Glaucoma 12/22/2007   DIVERTICULOSIS OF COLON 12/22/2007   HYPERCHOLESTEROLEMIA 11/25/2007   Essential hypertension 11/25/2007   Osteoarthritis 11/25/2007    Immunization History  Administered Date(s) Administered   Fluad Quad(high Dose 65+) 05/14/2019, 05/25/2020   Influenza Split 06/22/2011   Influenza Whole 06/20/2009, 06/21/2010, 06/18/2012   Influenza, High Dose Seasonal PF 06/10/2017   Influenza,inj,Quad PF,6+ Mos 06/18/2013, 06/23/2014, 05/27/2015   Influenza-Unspecified 05/27/2016, 06/27/2018   PFIZER(Purple Top)SARS-COV-2 Vaccination 10/20/2019, 11/10/2019   Pneumococcal Conjugate-13 08/12/2014   Pneumococcal Polysaccharide-23 08/17/2015   Td 08/12/2014   Zoster, Live 05/27/2016    Conditions to be addressed/monitored:  {USCCMDZASSESSMENTOPTIONS:23563}  There are no care plans that you recently modified to display for this patient.    Medication Assistance: {MEDASSISTANCEINFO:25044}  Compliance/Adherence/Medication fill history: Care Gaps: DEXA scan Vaccines - Shingrix, covid booster, flu  Star-Rating Drugs: Atorvastatin - LF 04/05/21 x 90 ds   Patient's preferred pharmacy is:  [id number] [address] [place]: [phone]: [phone] [id number] [place]Phone: [phone]: [phone] Uses pill box? {Yes or If no, why not?:20788} Pt endorses ***% compliance  We discussed: {Pharmacy options:24294} Patient decided [facility] and Follow Up Patient  Decision:  {FOLLOWUP:24991}  Plan: {CM FOLLOW UP VXYI:01655}  ***    Current Barriers:  {pharmacybarriers:24917}  Pharmacist Clinical Goal(s):  Patient will {PHARMACYGOALCHOICES:24921} through collaboration with PharmD and provider.   Interventions: 1:1 collaboration with Binnie Rail, MD regarding development and update of comprehensive plan of care as evidenced by provider attestation and co-signature Inter-disciplinary care team collaboration (see longitudinal plan of care) Comprehensive medication review performed; medication list updated in electronic medical record    Hypertension    BP goal is:  <130/80 Patient checks BP at home several times per month Patient home BP readings are ranging: 120s/80s   Patient has failed these meds in the past: n/a Patient is currently controlled on the following medications:  Amlodipine 5 mg daily Metoprolol tartrate 50 mg daily   We discussed BP goal, benefits of medications; patient denies side effects or issues;   Plan: Continue current medications    AFIB    Patient is currently rate controlled. Hx failed DCCV 03/2012  Patient has failed these meds in past: n/a Patient is currently controlled on the following medications:  Metoprolol tartrate 50 mg BID Eliquis 2.5 mg BID   We discussed: purpose of medications; bleeding risk with Eliquis; pt denies bleeding/bruising.   Plan   Continue current medications   Hyperlipidemia    LDL goal < 100  Patient has failed these meds in  past: n/a Patient is currently controlled on the following medications:  Atorvastatin 20 mg daily   We discussed:  Cholesterol goals; benefits of statin for ASCVD risk reduction   Plan: Continue current medications   Tobacco Abuse   Patient smokes {Time to first cigarette:23873} Patient triggers include: {Smoking Triggers:23882} On a scale of 1-10, reports MOTIVATION to quit is *** On a scale of 1-10, reports CONFIDENCE in quitting is ***  Previous quit attempts included: ***  We discussed:  {Smoking Cessation Counseling:23883}  Plan: ***   Patient Goals/Self-Care Activities Patient will:  - {pharmacypatientgoals:24919}  Follow Up Plan: {CM FOLLOW UP VZSM:27078}

## 2021-05-24 ENCOUNTER — Encounter: Payer: Self-pay | Admitting: Internal Medicine

## 2021-05-24 ENCOUNTER — Other Ambulatory Visit: Payer: Self-pay

## 2021-05-24 NOTE — Patient Instructions (Addendum)
    Flu immunization administered today.     Blood work was ordered.     Medications changes include :   none     Please followup in 6 months  

## 2021-05-24 NOTE — Progress Notes (Signed)
Subjective:    Patient ID: [name], female    DOB: November 19, 1933, 87 y.o.   MRN: [id number]  This visit occurred during the SARS-CoV-2 public health emergency.  Safety protocols were in place, including screening questions prior to the visit, additional usage of staff PPE, and extensive cleaning of exam room while observing appropriate contact time as indicated for disinfecting solutions.     HPI The patient is here for follow up of their chronic medical problems, including Afib, htn, hichol, neuropathy/weakness/poor balance  She continues to have mobility issues and chronic pain in her back and knee.  She uses a cane and walker to ambulate in her house.  She otherwise feels well.  She is taking the medication.  Medications and allergies reviewed with patient and updated if appropriate.  Patient Active Problem List   Diagnosis Date Noted   COPD (chronic obstructive pulmonary disease) ([place]) 12/01/2019   DOE (dyspnea on exertion) 11/23/2019   Neuropathy 07/03/2018   Low back pain 06/03/2018   Rectal prolapse, small 03/25/2018   Leg weakness, bilateral 03/25/2018   Hemorrhoids 09/25/2017   Arthritis of sternoclavicular joint 06/10/2017   Cervical radiculopathy 06/10/2017   Thyroid nodule 03/25/2017   Numbness in both hands 09/24/2016   Poor balance 09/24/2016   Irritable bowel syndrome (IBS) 02/24/2015   Long term current use of anticoagulant therapy 02/07/2012   Atrial fibrillation ([place]) 02/01/2012   S/P dilatation of esophageal stricture 12/25/2010   Osteopenia of the elderly 06/23/2010   Vitamin D deficiency 06/20/2009   RENAL CYST 12/20/2008   CIGARETTE SMOKER 12/22/2007   Glaucoma 12/22/2007   DIVERTICULOSIS OF COLON 12/22/2007   HYPERCHOLESTEROLEMIA 11/25/2007   Essential hypertension 11/25/2007   Osteoarthritis 11/25/2007    Current Outpatient Medications on File Prior to Visit  Medication Sig Dispense Refill   atorvastatin (LIPITOR) 20 MG tablet TAKE  1 TABLET BY MOUTH DAILY AT 6PM 90 tablet 1   brimonidine (ALPHAGAN) 0.2 % ophthalmic solution Place 1 drop into the left eye 3 times daily.     brimonidine (ALPHAGAN) 0.2 % ophthalmic solution Place 1 drop into the left eye 3 times daily.     Calcium Carb-Cholecalciferol (CALCIUM-VITAMIN D3) 600-400 MG-UNIT CAPS Take 1 tablet by mouth every other day.     dorzolamide-timolol (COSOPT) 22.3-6.8 MG/ML ophthalmic solution Place 1 drop into both eyes 2 times daily.     ELIQUIS 2.5 MG TABS tablet TAKE 1 TABLET BY MOUTH TWICE A DAY 180 tablet 1   gabapentin (NEURONTIN) 300 MG capsule Take 1 capsule (300 mg total) by mouth 2 (two) times daily. 180 capsule 3   latanoprost (XALATAN) 0.005 % ophthalmic solution Place 1 drop into both eyes nightly.     metoprolol tartrate (LOPRESSOR) 50 MG tablet TAKE 1 TABLET BY MOUTH TWICE A DAY 180 tablet 3   pilocarpine (PILOCAR) 4 % ophthalmic solution Place 1 drop into the left eye 3 (three) times daily.     Vitamin D, Cholecalciferol, 50 MCG (2000 UT) CAPS Take 1 tablet by mouth every other day.     amLODipine (NORVASC) 5 MG tablet Take 1 tablet (5 mg total) by mouth daily. 180 tablet 3   No current facility-administered medications on file prior to visit.    Past Medical History:  Diagnosis Date   Anxiety    Atrial fibrillation ([place])    Cigarette smoker    Depression    Diverticulosis of colon    DJD (degenerative joint disease)  Family history of colon cancer    Glaucoma    Hemorrhoids    History of sebaceous cyst    R posterior scalp - observation elected 01/2015 after surgical eval   Hyperlipidemia    Hypertension    Osteopenia    Renal cyst    Restless leg syndrome    Vitamin D deficiency     Past Surgical History:  Procedure Laterality Date   CARDIOVERSION  04/23/2012   Procedure: CARDIOVERSION;  Surgeon: Hillary Bow, MD;  Location: [facility];  Service: Cardiovascular;  Laterality: N/A;   CATARACT EXTRACTION     COLONOSCOPY      retina surgery x 2  2013   TOTAL HIP ARTHROPLASTY  2/05   right by Dr. Tonita Cong   UPPER GASTROINTESTINAL ENDOSCOPY      Social History   Socioeconomic History   Marital status: Widowed    Spouse name: Not on file   Number of children: 0   Years of education: Not on file   Highest education level: Not on file  Occupational History   Occupation: Retired  Tobacco Use   Smoking status: Light Smoker    Packs/day: 0.30    Types: Cigarettes   Smokeless tobacco: Never   Tobacco comments:    4-5 cig a day  Substance and Sexual Activity   Alcohol use: No    Alcohol/week: 0.0 standard drinks   Drug use: No   Sexual activity: Not Currently  Other Topics Concern   Not on file  Social History Narrative   Exercises 2 times per week   Caffeine use: 1 cup coffee per day   Lives alone, widowed 1978   Social Determinants of Health   Financial Resource Strain: Not on file  Food Insecurity: No Food Insecurity   Worried About Charity fundraiser in the Last Year: Never true   Arboriculturist in the Last Year: Never true  Transportation Needs: No Transportation Needs   Lack of Transportation (Medical): No   Lack of Transportation (Non-Medical): No  Physical Activity: Inactive   Days of Exercise per Week: 0 days   Minutes of Exercise per Session: 0 min  Stress: No Stress Concern Present   Feeling of Stress : Not at all  Social Connections: Socially Isolated   Frequency of Communication with Friends and Family: More than three times a week   Frequency of Social Gatherings with Friends and Family: Once a week   Attends Religious Services: Never   Marine scientist or Organizations: No   Attends Archivist Meetings: Never   Marital Status: Widowed    Family History  Problem Relation Age of Onset   Hypertension Mother    Diabetes Mother    Colon cancer Brother    Prostate cancer Father    Breast cancer Other        niece   Diabetes Brother    Diabetes Sister      Review of Systems  Constitutional:  Negative for fever.  Respiratory:  Positive for shortness of breath (at times). Negative for cough and wheezing.   Cardiovascular:  Positive for palpitations. Negative for chest pain and leg swelling.  Neurological:  Positive for numbness (legs, feet, hands). Negative for dizziness, light-headedness and headaches.      Objective:   Vitals:   05/25/21 1103  BP: (!) 146/78  Pulse: 90  Temp: 98.1 F (36.7 C)  SpO2: 98%   BP Readings from Last 3 Encounters:  05/25/21 (!) 146/78  12/19/20 (!) 178/108  11/22/20 132/86   Wt Readings from Last 3 Encounters:  05/25/21 122 lb (55.3 kg)  12/19/20 123 lb 9.6 oz (56.1 kg)  11/22/20 125 lb (56.7 kg)   Body mass index is 19.69 kg/m.   Physical Exam    Constitutional: Appears well-developed and well-nourished. No distress.  HENT:  Head: Normocephalic and atraumatic.  Neck: Neck supple. No tracheal deviation present. No thyromegaly present.  No cervical lymphadenopathy Cardiovascular: Normal rate, irregular rhythm and normal heart sounds.   No murmur heard. No carotid bruit .  No edema Pulmonary/Chest: Effort normal and breath sounds normal. No respiratory distress. No has no wheezes. No rales.  Skin: Skin is warm and dry. Not diaphoretic.  Psychiatric: Normal mood and affect. Behavior is normal.      Assessment & Plan:    See Problem List for Assessment and Plan of chronic medical problems.

## 2021-05-25 ENCOUNTER — Ambulatory Visit: Payer: Medicare Other | Admitting: Internal Medicine

## 2021-05-25 VITALS — BP 146/78 | HR 90 | Temp 98.1°F | Ht 66.0 in | Wt 122.0 lb

## 2021-05-25 DIAGNOSIS — I10 Essential (primary) hypertension: Secondary | ICD-10-CM | POA: Diagnosis not present

## 2021-05-25 DIAGNOSIS — Z23 Encounter for immunization: Secondary | ICD-10-CM | POA: Diagnosis not present

## 2021-05-25 DIAGNOSIS — G629 Polyneuropathy, unspecified: Secondary | ICD-10-CM

## 2021-05-25 DIAGNOSIS — I4821 Permanent atrial fibrillation: Secondary | ICD-10-CM | POA: Diagnosis not present

## 2021-05-25 DIAGNOSIS — E7800 Pure hypercholesterolemia, unspecified: Secondary | ICD-10-CM

## 2021-05-25 LAB — COMPREHENSIVE METABOLIC PANEL
ALT: 18 U/L (ref 0–35)
AST: 20 U/L (ref 0–37)
Albumin: 4.2 g/dL (ref 3.5–5.2)
Alkaline Phosphatase: 77 U/L (ref 39–117)
BUN: 13 mg/dL (ref 6–23)
CO2: 25 mEq/L (ref 19–32)
Calcium: 9.5 mg/dL (ref 8.4–10.5)
Chloride: 108 mEq/L (ref 96–112)
Creatinine, Ser: 0.82 mg/dL (ref 0.40–1.20)
GFR: 65.18 mL/min (ref >60.00)
Glucose, Bld: 83 mg/dL (ref 70–99)
Potassium: 3.7 mEq/L (ref 3.5–5.1)
Sodium: 142 mEq/L (ref 135–145)
Total Bilirubin: 0.7 mg/dL (ref 0.2–1.2)
Total Protein: 7.1 g/dL (ref 6.0–8.3)

## 2021-05-25 LAB — CBC WITH DIFFERENTIAL/PLATELET
Basophils Absolute: 0 10*3/uL (ref 0.0–0.1)
Basophils Relative: 0.9 % (ref 0.0–3.0)
Eosinophils Absolute: 0.1 10*3/uL (ref 0.0–0.7)
Eosinophils Relative: 3.3 % (ref 0.0–5.0)
HCT: 42.6 % (ref 36.0–46.0)
Hemoglobin: 14 g/dL (ref 12.0–15.0)
Lymphocytes Relative: 32.5 % (ref 12.0–46.0)
Lymphs Abs: 1.2 10*3/uL (ref 0.7–4.0)
MCHC: 32.9 g/dL (ref 30.0–36.0)
MCV: 92.1 fl (ref 78.0–100.0)
Monocytes Absolute: 0.4 10*3/uL (ref 0.1–1.0)
Monocytes Relative: 10.7 % (ref 3.0–12.0)
Neutro Abs: 1.9 10*3/uL (ref 1.4–7.7)
Neutrophils Relative %: 52.6 % (ref 43.0–77.0)
Platelets: 144 10*3/uL — ABNORMAL LOW (ref 150.0–400.0)
RBC: 4.63 Mil/uL (ref 3.87–5.11)
RDW: 13.7 % (ref 11.5–15.5)
WBC: 3.6 10*3/uL — ABNORMAL LOW (ref 4.0–10.5)

## 2021-05-25 LAB — LIPID PANEL
Cholesterol: 162 mg/dL (ref 0–200)
HDL: 71.7 mg/dL (ref >39.00)
LDL Cholesterol: 79 mg/dL (ref 0–99)
NonHDL: 90.62
Total CHOL/HDL Ratio: 2
Triglycerides: 59 mg/dL (ref 0.0–149.0)
VLDL: 11.8 mg/dL (ref 0.0–40.0)

## 2021-05-25 LAB — TSH: TSH: 1.16 u[IU]/mL (ref 0.35–5.50)

## 2021-05-25 LAB — HEMOGLOBIN A1C: Hgb A1c MFr Bld: 6 % (ref 4.6–6.5)

## 2021-05-25 NOTE — Addendum Note (Signed)
Addended by: Marcina Millard on: 05/25/2021 05:02 PM   Modules accepted: Orders

## 2021-05-25 NOTE — Assessment & Plan Note (Addendum)
Chronic Following with cardiology On Eliquis 2.5 mg twice daily, metoprolol 50 mg twice daily Not having any concerning symptoms CBC, CMP, TSH

## 2021-05-25 NOTE — Assessment & Plan Note (Signed)
Chronic °Regular exercise and healthy diet encouraged °Check lipid panel  °Continue atorvastatin 20 mg daily °

## 2021-05-25 NOTE — Assessment & Plan Note (Signed)
Chronic Blood pressure well controlled CMP Continue amlodipine 5 mg daily, metoprolol 50 mg twice daily

## 2021-05-25 NOTE — Assessment & Plan Note (Signed)
Chronic Continue gabapentin 300 mg twice daily-she thinks this does help Continue using cane or walker when ambulating to help prevent falls

## 2021-06-09 ENCOUNTER — Telehealth: Payer: Self-pay

## 2021-06-09 NOTE — Chronic Care Management (AMB) (Signed)
  Care Management   Note  06/09/2021 Name: [name] MRN: [id number] DOB: 24-Apr-1934  [name] is a 87 y.o. year old female who is a primary care patient of Quay Burow, Claudina Lick, MD and is actively engaged with the care management team. I reached out to [name] by phone today to return call from message left.Patient had  a follow up visit with the Pharmacist on 06/12/2021 and states that she was unaware of visit and wishes to cancel due to too many phone calls.  Follow up plan: Patient declines further follow up and engagement by the care management team. Appropriate care team members and provider have been notified via electronic communication.   Noreene Larsson, [place], [place], [place] [address] Direct Dial: [phone] Tempest [email or web address] Website: [email or web address]

## 2021-06-12 ENCOUNTER — Telehealth: Payer: Medicare Other

## 2021-07-01 ENCOUNTER — Other Ambulatory Visit: Payer: Self-pay | Admitting: Internal Medicine

## 2021-07-26 ENCOUNTER — Other Ambulatory Visit: Payer: Self-pay | Admitting: Internal Medicine

## 2021-08-21 ENCOUNTER — Other Ambulatory Visit: Payer: Self-pay | Admitting: Cardiology

## 2021-08-21 DIAGNOSIS — I4821 Permanent atrial fibrillation: Secondary | ICD-10-CM

## 2021-08-22 NOTE — Telephone Encounter (Signed)
Prescription refill request for Eliquis received. Indication: a fib Last office visit: 12/19/20 Scr: 0.82 Age: 87 Weight: 55kg

## 2021-09-11 ENCOUNTER — Telehealth: Payer: Medicare Other

## 2021-11-22 ENCOUNTER — Encounter: Payer: Self-pay | Admitting: Internal Medicine

## 2021-11-22 DIAGNOSIS — R7303 Prediabetes: Secondary | ICD-10-CM | POA: Insufficient documentation

## 2021-11-22 NOTE — Progress Notes (Signed)
? ? ? ? ?Subjective:  ? ? Patient ID: [name], female    DOB: [birth year], 88 y.o.   MRN: [id number] ? ?This visit occurred during the SARS-CoV-2 public health emergency.  Safety protocols were in place, including screening questions prior to the visit, additional usage of staff PPE, and extensive cleaning of exam room while observing appropriate contact time as indicated for disinfecting solutions.   ? ? ?HPI ?Tayleigh is here for follow up of her chronic medical problems, including Afib, htn, hld, prediabetes, neuropathy/weakness/poor balance ? ?She started taking the metoprolol once a day only ? ?She has no energy.   She thinks the medications is causing her to have no energy.  She wonders if she needs them-has been on them for a long time. ? ?She does not sleep well.   ? ?Medications and allergies reviewed with patient and updated if appropriate. ? ?Current Outpatient Medications on File Prior to Visit  ?Medication Sig Dispense Refill  ? atorvastatin (LIPITOR) 20 MG tablet TAKE 1 TABLET BY MOUTH DAILY AT 6PM 90 tablet 1  ? brimonidine (ALPHAGAN) 0.2 % ophthalmic solution Place 1 drop into the left eye 3 times daily.    ? Calcium Carb-Cholecalciferol (CALCIUM-VITAMIN D3) 600-400 MG-UNIT CAPS Take 1 tablet by mouth every other day.    ? dorzolamide-timolol (COSOPT) 22.3-6.8 MG/ML ophthalmic solution Place 1 drop into both eyes 2 times daily.    ? ELIQUIS 2.5 MG TABS tablet TAKE 1 TABLET BY MOUTH TWICE A DAY 180 tablet 1  ? gabapentin (NEURONTIN) 300 MG capsule TAKE 1 CAPSULE BY MOUTH THREE TIMES A DAY 270 capsule 3  ? latanoprost (XALATAN) 0.005 % ophthalmic solution Place 1 drop into both eyes nightly.    ? metoprolol tartrate (LOPRESSOR) 50 MG tablet TAKE 1 TABLET BY MOUTH TWICE A DAY 180 tablet 3  ? pilocarpine (PILOCAR) 4 % ophthalmic solution Place 1 drop into the left eye 3 (three) times daily.    ? Vitamin D, Cholecalciferol, 50 MCG (2000 UT) CAPS Take 1 tablet by mouth every other day.    ?  amLODipine (NORVASC) 5 MG tablet Take 1 tablet (5 mg total) by mouth daily. 180 tablet 3  ? ?No current facility-administered medications on file prior to visit.  ? ? ? ?Review of Systems  ?Constitutional:  Positive for fatigue. Negative for fever.  ?Respiratory:  Positive for shortness of breath (chronic - no change). Negative for cough and wheezing.   ?Cardiovascular:  Negative for chest pain, palpitations and leg swelling.  ?Neurological:  Positive for headaches. Negative for light-headedness.  ?Psychiatric/Behavioral:  Positive for sleep disturbance.   ? ?   ?Objective:  ? ?Vitals:  ? 11/23/21 1048  ?BP: (!) 144/84  ?Pulse: 100  ?Temp: 98 ?F (36.7 ?C)  ?SpO2: 98%  ? ?BP Readings from Last 3 Encounters:  ?11/23/21 (!) 144/84  ?05/25/21 (!) 146/78  ?12/19/20 (!) 178/108  ? ?Wt Readings from Last 3 Encounters:  ?11/23/21 120 lb 12.8 oz (54.8 kg)  ?05/25/21 122 lb (55.3 kg)  ?12/19/20 123 lb 9.6 oz (56.1 kg)  ? ?Body mass index is 19.5 kg/m?. ? ?  ?Physical Exam ?Constitutional:   ?   General: She is not in acute distress. ?   Appearance: Normal appearance.  ?HENT:  ?   Head: Normocephalic and atraumatic.  ?Eyes:  ?   Conjunctiva/sclera: Conjunctivae normal.  ?Cardiovascular:  ?   Rate and Rhythm: Normal rate. Rhythm irregular.  ?   Heart sounds: Normal  heart sounds. No murmur heard. ?Pulmonary:  ?   Effort: Pulmonary effort is normal. No respiratory distress.  ?   Breath sounds: Normal breath sounds. No wheezing.  ?Musculoskeletal:  ?   Cervical back: Neck supple.  ?   Right lower leg: No edema.  ?   Left lower leg: No edema.  ?Lymphadenopathy:  ?   Cervical: No cervical adenopathy.  ?Skin: ?   Findings: No rash.  ?Neurological:  ?   Mental Status: She is alert. Mental status is at baseline.  ?Psychiatric:     ?   Mood and Affect: Mood normal.     ?   Behavior: Behavior normal.  ? ?   ? ?Lab Results  ?Component Value Date  ? WBC 3.6 (L) 05/25/2021  ? HGB 14.0 05/25/2021  ? HCT 42.6 05/25/2021  ? PLT 144.0 (L)  05/25/2021  ? GLUCOSE 83 05/25/2021  ? CHOL 162 05/25/2021  ? TRIG 59.0 05/25/2021  ? HDL 71.70 05/25/2021  ? [place] 79 05/25/2021  ? ALT 18 05/25/2021  ? AST 20 05/25/2021  ? NA 142 05/25/2021  ? K 3.7 05/25/2021  ? CL 108 05/25/2021  ? CREATININE 0.82 05/25/2021  ? BUN 13 05/25/2021  ? CO2 25 05/25/2021  ? TSH 1.16 05/25/2021  ? INR 2.1 06/21/2015  ? HGBA1C 6.0 05/25/2021  ? ? ? ?Assessment & Plan:  ? ? ?See Problem List for Assessment and Plan of chronic medical problems.  ? ? ?

## 2021-11-22 NOTE — Patient Instructions (Addendum)
? ?  Decrease gabapentin to just once a day to see if it helps with your energy level.   ? ? ?If that does not help with your energy level and your pain increases restart it twice a day. ? ?Then you can try stopping the atorvastatin for 3 week to see if that helps with your energy.   ? ? ?Blood work was ordered.   ? ? ?Medications changes include :   None, except for the above.  ? ? ? ?Return in about 6 months (around 05/26/2022) for follow up. ? ?

## 2021-11-23 ENCOUNTER — Ambulatory Visit: Payer: Medicare Other | Admitting: Internal Medicine

## 2021-11-23 VITALS — BP 144/84 | HR 100 | Temp 98.0°F | Ht 66.0 in | Wt 120.8 lb

## 2021-11-23 DIAGNOSIS — E7800 Pure hypercholesterolemia, unspecified: Secondary | ICD-10-CM

## 2021-11-23 DIAGNOSIS — E559 Vitamin D deficiency, unspecified: Secondary | ICD-10-CM

## 2021-11-23 DIAGNOSIS — I10 Essential (primary) hypertension: Secondary | ICD-10-CM | POA: Diagnosis not present

## 2021-11-23 DIAGNOSIS — R7303 Prediabetes: Secondary | ICD-10-CM | POA: Diagnosis not present

## 2021-11-23 DIAGNOSIS — R5382 Chronic fatigue, unspecified: Secondary | ICD-10-CM

## 2021-11-23 DIAGNOSIS — G629 Polyneuropathy, unspecified: Secondary | ICD-10-CM | POA: Diagnosis not present

## 2021-11-23 DIAGNOSIS — R202 Paresthesia of skin: Secondary | ICD-10-CM | POA: Diagnosis not present

## 2021-11-23 DIAGNOSIS — R5383 Other fatigue: Secondary | ICD-10-CM | POA: Insufficient documentation

## 2021-11-23 LAB — VITAMIN D 25 HYDROXY (VIT D DEFICIENCY, FRACTURES): VITD: 44.34 ng/mL (ref 30.00–100.00)

## 2021-11-23 LAB — CBC WITH DIFFERENTIAL/PLATELET
Basophils Absolute: 0 10*3/uL (ref 0.0–0.1)
Basophils Relative: 0.7 % (ref 0.0–3.0)
Eosinophils Absolute: 0.1 10*3/uL (ref 0.0–0.7)
Eosinophils Relative: 2.7 % (ref 0.0–5.0)
HCT: 43.9 % (ref 36.0–46.0)
Hemoglobin: 14.3 g/dL (ref 12.0–15.0)
Lymphocytes Relative: 33 % (ref 12.0–46.0)
Lymphs Abs: 1.2 10*3/uL (ref 0.7–4.0)
MCHC: 32.6 g/dL (ref 30.0–36.0)
MCV: 92.3 fl (ref 78.0–100.0)
Monocytes Absolute: 0.4 10*3/uL (ref 0.1–1.0)
Monocytes Relative: 12 % (ref 3.0–12.0)
Neutro Abs: 1.8 10*3/uL (ref 1.4–7.7)
Neutrophils Relative %: 51.6 % (ref 43.0–77.0)
Platelets: 150 10*3/uL (ref 150.0–400.0)
RBC: 4.75 Mil/uL (ref 3.87–5.11)
RDW: 13.2 % (ref 11.5–15.5)
WBC: 3.6 10*3/uL — ABNORMAL LOW (ref 4.0–10.5)

## 2021-11-23 LAB — COMPREHENSIVE METABOLIC PANEL
ALT: 14 U/L (ref 0–35)
AST: 20 U/L (ref 0–37)
Albumin: 4.1 g/dL (ref 3.5–5.2)
Alkaline Phosphatase: 79 U/L (ref 39–117)
BUN: 13 mg/dL (ref 6–23)
CO2: 27 mEq/L (ref 19–32)
Calcium: 9.5 mg/dL (ref 8.4–10.5)
Chloride: 106 mEq/L (ref 96–112)
Creatinine, Ser: 0.87 mg/dL (ref 0.40–1.20)
GFR: 60.5 mL/min (ref >60.00)
Glucose, Bld: 93 mg/dL (ref 70–99)
Potassium: 3.8 mEq/L (ref 3.5–5.1)
Sodium: 141 mEq/L (ref 135–145)
Total Bilirubin: 0.8 mg/dL (ref 0.2–1.2)
Total Protein: 6.7 g/dL (ref 6.0–8.3)

## 2021-11-23 LAB — VITAMIN B12: Vitamin B-12: 314 pg/mL (ref 211–911)

## 2021-11-23 LAB — LIPID PANEL
Cholesterol: 155 mg/dL (ref 0–200)
HDL: 66.2 mg/dL (ref >39.00)
LDL Cholesterol: 76 mg/dL (ref 0–99)
NonHDL: 89.17
Total CHOL/HDL Ratio: 2
Triglycerides: 64 mg/dL (ref 0.0–149.0)
VLDL: 12.8 mg/dL (ref 0.0–40.0)

## 2021-11-23 LAB — HEMOGLOBIN A1C: Hgb A1c MFr Bld: 6.1 % (ref 4.6–6.5)

## 2021-11-23 NOTE — Assessment & Plan Note (Signed)
Chronic ?Check a1c ?Low sugar / carb diet ?Stressed regular exercise/activity-is much as tolerated ?

## 2021-11-23 NOTE — Assessment & Plan Note (Signed)
Chronic °Regular exercise and healthy diet encouraged °Check lipid panel  °Continue atorvastatin 20 mg daily °

## 2021-11-23 NOTE — Assessment & Plan Note (Addendum)
Chronic ?Controlled ?Poor balance-uses cane or walker when ambulating ?Continue gabapentin 300 mg 3 times daily-can decrease dose slowly to see if it is contributing to her fatigue ?

## 2021-11-23 NOTE — Assessment & Plan Note (Addendum)
Chronic ?Blood pressure fairly controlled-she did not take her medications today ?CMP ?Continue amlodipine 5 mg daily, metoprolol 2 mg twice daily-stressed taking her medication as prescribed to help prevent stroke/heart attack ?

## 2021-11-23 NOTE — Assessment & Plan Note (Signed)
Subacute ?Tingling in hands and feet ?B12  ?

## 2021-11-23 NOTE — Assessment & Plan Note (Signed)
Chronic ?States no energy, which is not new ?She wonders if it is her medication because she has been on them for a long time ?She decreased the metoprolol to just once a day.  Today she did not take anything ?Stressed that she needs to take her Eliquis daily and her metoprolol-this can be adjusted, but she cannot just stop medications ?We will check basic blood work CBC, CMP, TSH ?Can try decreasing gabapentin to once a day at night to see if that helps with her energy and then stop completely to see if there is any change-she is unsure if it is how much is helping milligrams, but probably is helping ?Can also trial of stopping the statin to see if that makes a difference ?Most likely her fatigue is multifactorial from other causes ?

## 2021-12-28 ENCOUNTER — Other Ambulatory Visit: Payer: Self-pay | Admitting: Internal Medicine

## 2022-01-29 ENCOUNTER — Ambulatory Visit: Payer: Medicare Other

## 2022-01-29 ENCOUNTER — Telehealth: Payer: Self-pay

## 2022-01-29 NOTE — Telephone Encounter (Signed)
Contacted patient on preferred number listed in notes for scheduled AWV. Patient stated unable to complete visit today and will call back to reschedule. 

## 2022-02-12 ENCOUNTER — Other Ambulatory Visit: Payer: Self-pay | Admitting: Cardiology

## 2022-02-12 DIAGNOSIS — I4821 Permanent atrial fibrillation: Secondary | ICD-10-CM

## 2022-02-12 NOTE — Telephone Encounter (Signed)
Prescription refill request for Eliquis received. Indication:Afib Last office visit:upcoming Scr:0.8 Age: 88 Weight:54.8 kg  Prescription refilled

## 2022-02-17 IMAGING — DX DG CHEST 2V
2 series · 2 of 2 positions shown · non-contrast
Comparison: 12/24/2013.

CLINICAL DATA: Shortness of breath.

EXAM:
CHEST - 2 VIEW

[chest pa]
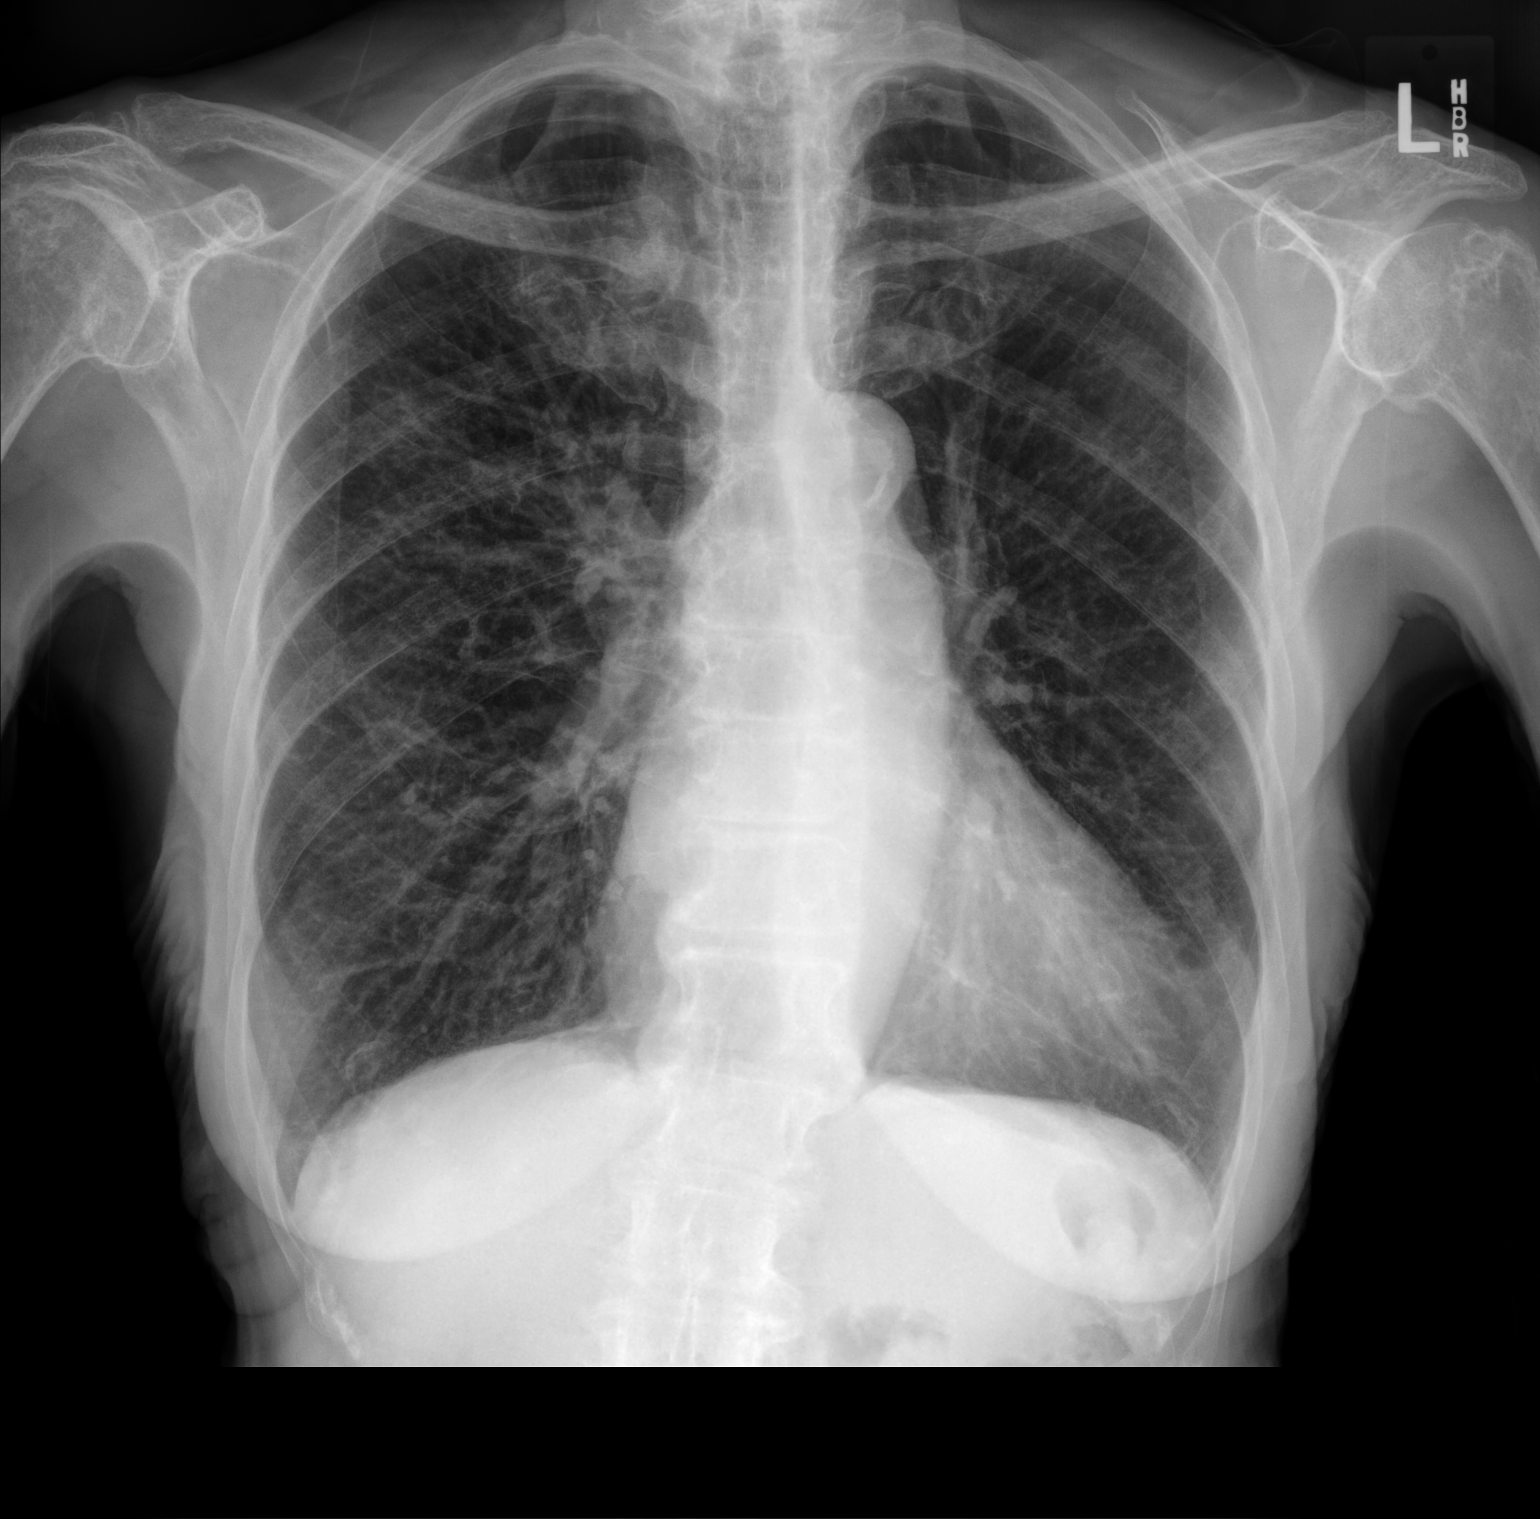

[chest lat]
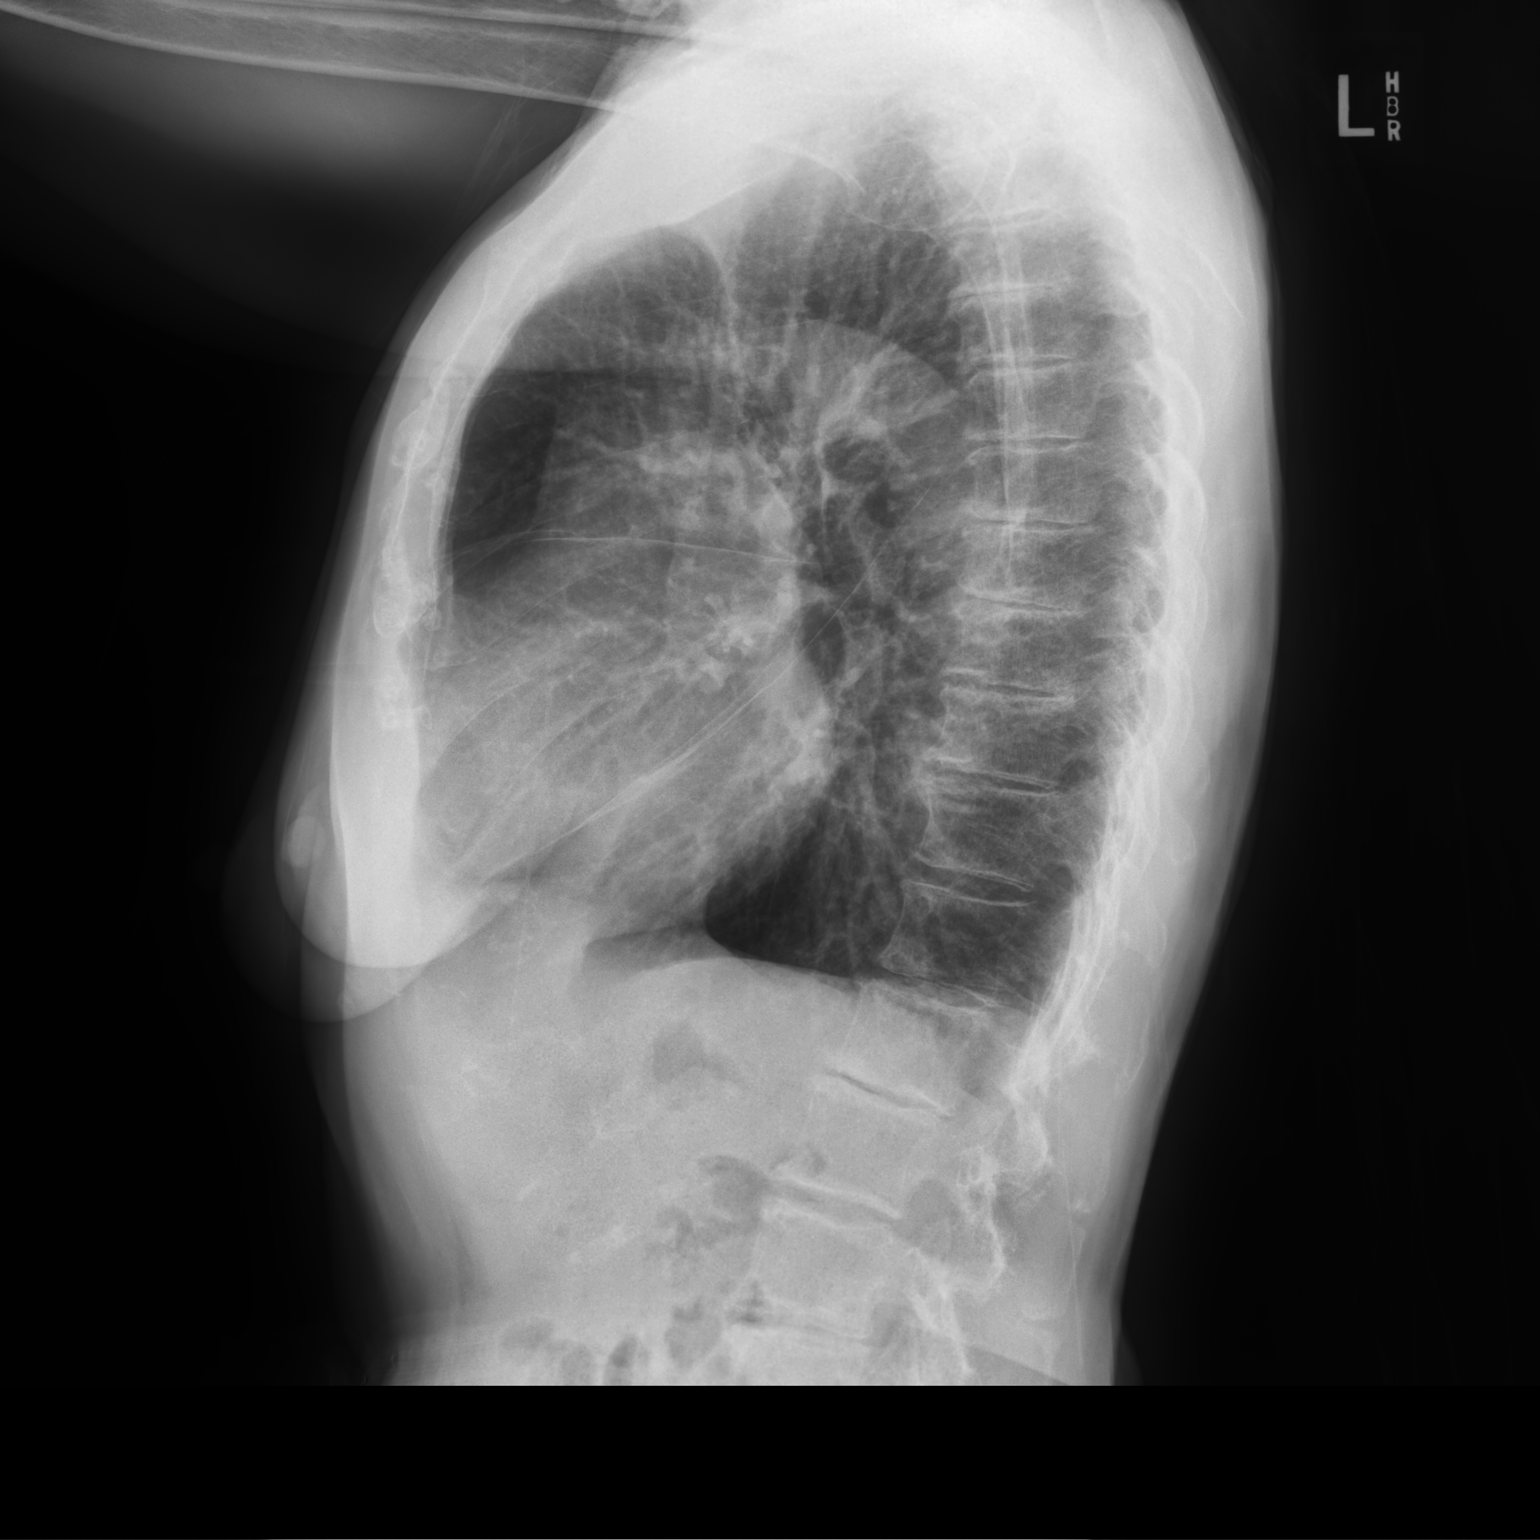

[2 of 2 positions shown; findings below may reference images not displayed]

FINDINGS: Mediastinum hilar structures normal. Mild bilateral interstitial
prominence. Pneumonitis could present in this fashion. Question
patient's COVID status. No pleural effusion or pneumothorax.
Cardiomegaly. No pulmonary venous congestion. Degenerative changes
scoliosis thoracic spine. Degenerative changes both shoulders.
IMPRESSION: Mild bilateral interstitial prominence. Pneumonitis could present in
this fashion. Question patient's COVID status.

## 2022-03-06 ENCOUNTER — Telehealth: Payer: Self-pay | Admitting: Internal Medicine

## 2022-03-06 NOTE — Telephone Encounter (Signed)
LVM for pt to rtn my call to schedule AWV with [facility] call back # [phone] 

## 2022-03-13 ENCOUNTER — Other Ambulatory Visit: Payer: Self-pay | Admitting: Cardiology

## 2022-04-05 ENCOUNTER — Other Ambulatory Visit: Payer: Self-pay | Admitting: Cardiology

## 2022-04-07 ENCOUNTER — Other Ambulatory Visit: Payer: Self-pay | Admitting: Cardiology

## 2022-05-31 NOTE — Progress Notes (Signed)
HPI: FU atrial fibrillation. She has a hx of s/p failed DCCV in 8/13, HTN, HL, tobacco abuse. Holter 10/14 showed afib, rate controlled.  Echocardiogram May 2022 showed normal LV function, moderate left atrial enlargement, mild mitral regurgitation.  Since last seen, she notes some dyspnea on exertion which is chronic.  No orthopnea, PND, pedal edema, chest pain or syncope.  No bleeding.  Current Outpatient Medications  Medication Sig Dispense Refill   amLODipine (NORVASC) 5 MG tablet Take 1 tablet (5 mg total) by mouth daily. PLEASE KEEP UPCOMING APPOINTMENT WITH CARDIOLOGIST FOR FUTURE REFILLS 90 tablet 3   atorvastatin (LIPITOR) 20 MG tablet TAKE 1 TABLET BY MOUTH EVERY DAY AT 6 PM 90 tablet 1   brimonidine (ALPHAGAN) 0.2 % ophthalmic solution Place 1 drop into the left eye 3 times daily.     Calcium Carb-Cholecalciferol (CALCIUM-VITAMIN D3) 600-400 MG-UNIT CAPS Take 1 tablet by mouth every other day.     dorzolamide-timolol (COSOPT) 22.3-6.8 MG/ML ophthalmic solution Place 1 drop into both eyes 2 times daily.     ELIQUIS 2.5 MG TABS tablet TAKE 1 TABLET BY MOUTH TWICE A DAY 180 tablet 1   gabapentin (NEURONTIN) 300 MG capsule TAKE 1 CAPSULE BY MOUTH THREE TIMES A DAY 270 capsule 3   metoprolol tartrate (LOPRESSOR) 50 MG tablet Take 1 tablet (50 mg total) by mouth 2 (two) times daily. Patient must keep scheduled appointment for future refills 60 tablet 0   Vitamin D, Cholecalciferol, 50 MCG (2000 UT) CAPS Take 1 tablet by mouth every other day.     alendronate (FOSAMAX) 70 MG tablet Take 1 tablet (70 mg total) by mouth every 7 (seven) days. Take with a full glass of water on an empty stomach. (Patient not taking: Reported on 06/14/2022) 12 tablet 3   No current facility-administered medications for this visit.     Past Medical History:  Diagnosis Date   Anxiety    Atrial fibrillation ([place])    Cigarette smoker    Depression    Diverticulosis of colon    DJD (degenerative joint  disease)    Family history of colon cancer    Glaucoma    Hemorrhoids    History of sebaceous cyst    R posterior scalp - observation elected 01/2015 after surgical eval   Hyperlipidemia    Hypertension    Osteopenia    Renal cyst    Restless leg syndrome    Vitamin D deficiency     Past Surgical History:  Procedure Laterality Date   CARDIOVERSION  04/23/2012   Procedure: CARDIOVERSION;  Surgeon: Hillary Bow, MD;  Location: [facility];  Service: Cardiovascular;  Laterality: N/A;   CATARACT EXTRACTION     COLONOSCOPY     retina surgery x 2  2013   TOTAL HIP ARTHROPLASTY  2/05   right by Dr. Tonita Cong   UPPER GASTROINTESTINAL ENDOSCOPY      Social History   Socioeconomic History   Marital status: Widowed    Spouse name: Not on file   Number of children: 0   Years of education: Not on file   Highest education level: Not on file  Occupational History   Occupation: Retired  Tobacco Use   Smoking status: Light Smoker    Packs/day: 0.30    Types: Cigarettes   Smokeless tobacco: Never   Tobacco comments:    4-5 cig a day  Substance and Sexual Activity   Alcohol use: No    Alcohol/week:  0.0 standard drinks of alcohol   Drug use: No   Sexual activity: Not Currently  Other Topics Concern   Not on file  Social History Narrative   Exercises 2 times per week   Caffeine use: 1 cup coffee per day   Lives alone, widowed 1978   Social Determinants of Health   Financial Resource Strain: Low Risk  (03/31/2020)   Overall Financial Resource Strain (CARDIA)    Difficulty of Paying Living Expenses: Not very hard  Food Insecurity: No Food Insecurity (01/26/2021)   Hunger Vital Sign    Worried About Running Out of Food in the Last Year: Never true    Ran Out of Food in the Last Year: Never true  Transportation Needs: No Transportation Needs (01/26/2021)   PRAPARE - Hydrologist (Medical): No    Lack of Transportation (Non-Medical): No  Physical  Activity: Inactive (01/26/2021)   Exercise Vital Sign    Days of Exercise per Week: 0 days    Minutes of Exercise per Session: 0 min  Stress: No Stress Concern Present (01/26/2021)   [place]    Feeling of Stress : Not at all  Social Connections: Socially Isolated (01/26/2021)   Social Connection and Isolation Panel [NHANES]    Frequency of Communication with Friends and Family: More than three times a week    Frequency of Social Gatherings with Friends and Family: Once a week    Attends Religious Services: Never    Marine scientist or Organizations: No    Attends Archivist Meetings: Never    Marital Status: Widowed  Intimate Partner Violence: Not At Risk (01/26/2021)   Humiliation, Afraid, Rape, and Kick questionnaire    Fear of Current or Ex-Partner: No    Emotionally Abused: No    Physically Abused: No    Sexually Abused: No    Family History  Problem Relation Age of Onset   Hypertension Mother    Diabetes Mother    Colon cancer Brother    Prostate cancer Father    Breast cancer Other        niece   Diabetes Brother    Diabetes Sister     ROS: no fevers or chills, productive cough, hemoptysis, dysphasia, odynophagia, melena, hematochezia, dysuria, hematuria, rash, seizure activity, orthopnea, PND, pedal edema, claudication. Remaining systems are negative.  Physical Exam: Well-developed well-nourished in no acute distress.  Skin is warm and dry.  HEENT is normal.  Neck is supple.  Chest with diminished breath sounds throughout Cardiovascular exam is irregular Abdominal exam nontender or distended. No masses palpated. Extremities show no edema. neuro grossly intact  ECG-atrial fibrillation at a rate of 103, normal axis, left ventricular hypertrophy with repolarization abnormality.  Personally reviewed  A/P  1 permanent atrial fibrillation-continue beta-blocker and apixaban.  2  hypertension-patient's blood pressure is controlled.  Continue present medications.  3 hyperlipidemia-continue statin.    4 tobacco abuse-we again discussed the importance of discontinuing.  Kirk Ruths, MD

## 2022-06-04 ENCOUNTER — Encounter: Payer: Self-pay | Admitting: Internal Medicine

## 2022-06-04 NOTE — Progress Notes (Unsigned)
Subjective:    Patient ID: [name], female    DOB: [birth year], 88 y.o.   MRN: [id number]     HPI [name] is here for follow up of her chronic medical problems, including Afib, htn, hld, prediabetes, neuropathy/weakness/poor balance  Eating two meals a day.  She does not cook.   Biscuit with sausage and egg for breakfast and usually has someone pick her up something to eat for dinner-vegetable, meat, side-does not always eat it.  She drinks ensure plus - will not drink a whole one in a day.  She often is just not hungry.  She has lost some weight since she was here last.  No falls  Medications and allergies reviewed with patient and updated if appropriate.  Current Outpatient Medications on File Prior to Visit  Medication Sig Dispense Refill   amLODipine (NORVASC) 5 MG tablet Take 1 tablet (5 mg total) by mouth daily. PLEASE KEEP UPCOMING APPOINTMENT WITH CARDIOLOGIST FOR FUTURE REFILLS 90 tablet 3   atorvastatin (LIPITOR) 20 MG tablet TAKE 1 TABLET BY MOUTH EVERY DAY AT 6 PM 90 tablet 1   brimonidine (ALPHAGAN) 0.2 % ophthalmic solution Place 1 drop into the left eye 3 times daily.     Calcium Carb-Cholecalciferol (CALCIUM-VITAMIN D3) 600-400 MG-UNIT CAPS Take 1 tablet by mouth every other day.     dorzolamide-timolol (COSOPT) 22.3-6.8 MG/ML ophthalmic solution Place 1 drop into both eyes 2 times daily.     ELIQUIS 2.5 MG TABS tablet TAKE 1 TABLET BY MOUTH TWICE A DAY 180 tablet 1   gabapentin (NEURONTIN) 300 MG capsule TAKE 1 CAPSULE BY MOUTH THREE TIMES A DAY 270 capsule 3   metoprolol tartrate (LOPRESSOR) 50 MG tablet Take 1 tablet (50 mg total) by mouth 2 (two) times daily. KEEP UPCOMING APPOINTMENT FOR FUTURE REFILLS. 60 tablet 2   pilocarpine (PILOCAR) 4 % ophthalmic solution Place 1 drop into the left eye 3 (three) times daily.     Vitamin D, Cholecalciferol, 50 MCG (2000 UT) CAPS Take 1 tablet by mouth every other day.     latanoprost (XALATAN) 0.005 %  ophthalmic solution Place 1 drop into both eyes nightly. (Patient not taking: Reported on 06/05/2022)     No current facility-administered medications on file prior to visit.     Review of Systems  Constitutional:  Positive for appetite change (less hungry). Negative for fever.  Respiratory:  Positive for cough (occ - clear phlegm) and shortness of breath (at times - especially in the morning). Negative for wheezing.   Cardiovascular:  Positive for palpitations. Negative for chest pain and leg swelling.  Musculoskeletal:  Positive for arthralgias and back pain.  Neurological:  Positive for headaches (mild, occ). Negative for light-headedness.       Objective:   Vitals:   06/05/22 1026  BP: (!) 140/84  Pulse: 70  Temp: 97.8 F (36.6 C)  SpO2: 96%   BP Readings from Last 3 Encounters:  06/05/22 (!) 140/84  11/23/21 (!) 144/84  05/25/21 (!) 146/78   Wt Readings from Last 3 Encounters:  06/05/22 116 lb (52.6 kg)  11/23/21 120 lb 12.8 oz (54.8 kg)  05/25/21 122 lb (55.3 kg)   Body mass index is 18.72 kg/m.    Physical Exam Constitutional:      General: She is not in acute distress.    Appearance: Normal appearance.  HENT:     Head: Normocephalic and atraumatic.  Eyes:     Conjunctiva/sclera: Conjunctivae  normal.  Cardiovascular:     Rate and Rhythm: Normal rate and regular rhythm.     Heart sounds: Normal heart sounds. No murmur heard. Pulmonary:     Effort: Pulmonary effort is normal. No respiratory distress.     Breath sounds: Normal breath sounds. No wheezing.  Musculoskeletal:     Cervical back: Neck supple.     Right lower leg: No edema.     Left lower leg: No edema.  Lymphadenopathy:     Cervical: No cervical adenopathy.  Skin:    General: Skin is warm and dry.     Findings: No rash.  Neurological:     Mental Status: She is alert. Mental status is at baseline.  Psychiatric:        Mood and Affect: Mood normal.        Behavior: Behavior normal.         Lab Results  Component Value Date   WBC 3.6 (L) 11/23/2021   HGB 14.3 11/23/2021   HCT 43.9 11/23/2021   PLT 150.0 11/23/2021   GLUCOSE 93 11/23/2021   CHOL 155 11/23/2021   TRIG 64.0 11/23/2021   HDL 66.20 11/23/2021   LDLCALC 76 11/23/2021   ALT 14 11/23/2021   AST 20 11/23/2021   NA 141 11/23/2021   K 3.8 11/23/2021   CL 106 11/23/2021   CREATININE 0.87 11/23/2021   BUN 13 11/23/2021   CO2 27 11/23/2021   TSH 1.16 05/25/2021   INR 2.1 06/21/2015   HGBA1C 6.1 11/23/2021     Assessment & Plan:    See Problem List for Assessment and Plan of chronic medical problems.

## 2022-06-04 NOTE — Patient Instructions (Addendum)
     Blood work was ordered.     Medications changes include :   none    Return in about 6 months (around 12/05/2022) for follow up, Schedule DEXA-[facility] - ? today.

## 2022-06-05 ENCOUNTER — Ambulatory Visit (INDEPENDENT_AMBULATORY_CARE_PROVIDER_SITE_OTHER)
Admission: RE | Admit: 2022-06-05 | Discharge: 2022-06-05 | Disposition: A | Discharge status: Home / Self Care | Payer: Medicare Other | Source: Ambulatory Visit | Attending: Internal Medicine | Admitting: Internal Medicine

## 2022-06-05 ENCOUNTER — Ambulatory Visit: Payer: Medicare Other | Admitting: Internal Medicine

## 2022-06-05 VITALS — BP 140/84 | HR 70 | Temp 97.8°F | Ht 66.0 in | Wt 116.0 lb

## 2022-06-05 DIAGNOSIS — M810 Age-related osteoporosis without current pathological fracture: Secondary | ICD-10-CM

## 2022-06-05 DIAGNOSIS — Z23 Encounter for immunization: Secondary | ICD-10-CM

## 2022-06-05 DIAGNOSIS — R29898 Other symptoms and signs involving the musculoskeletal system: Secondary | ICD-10-CM

## 2022-06-05 DIAGNOSIS — E7800 Pure hypercholesterolemia, unspecified: Secondary | ICD-10-CM

## 2022-06-05 DIAGNOSIS — M8580 Other specified disorders of bone density and structure, unspecified site: Secondary | ICD-10-CM

## 2022-06-05 DIAGNOSIS — I10 Essential (primary) hypertension: Secondary | ICD-10-CM | POA: Diagnosis not present

## 2022-06-05 DIAGNOSIS — I4821 Permanent atrial fibrillation: Secondary | ICD-10-CM

## 2022-06-05 DIAGNOSIS — R7303 Prediabetes: Secondary | ICD-10-CM | POA: Diagnosis not present

## 2022-06-05 LAB — CBC WITH DIFFERENTIAL/PLATELET
Basophils Absolute: 0 10*3/uL (ref 0.0–0.1)
Basophils Relative: 1.2 % (ref 0.0–3.0)
Eosinophils Absolute: 0.1 10*3/uL (ref 0.0–0.7)
Eosinophils Relative: 2.9 % (ref 0.0–5.0)
HCT: 42.9 % (ref 36.0–46.0)
Hemoglobin: 14.2 g/dL (ref 12.0–15.0)
Lymphocytes Relative: 32.4 % (ref 12.0–46.0)
Lymphs Abs: 1.3 10*3/uL (ref 0.7–4.0)
MCHC: 33.1 g/dL (ref 30.0–36.0)
MCV: 92.9 fl (ref 78.0–100.0)
Monocytes Absolute: 0.5 10*3/uL (ref 0.1–1.0)
Monocytes Relative: 12.7 % — ABNORMAL HIGH (ref 3.0–12.0)
Neutro Abs: 2.1 10*3/uL (ref 1.4–7.7)
Neutrophils Relative %: 50.8 % (ref 43.0–77.0)
Platelets: 140 10*3/uL — ABNORMAL LOW (ref 150.0–400.0)
RBC: 4.62 Mil/uL (ref 3.87–5.11)
RDW: 13.7 % (ref 11.5–15.5)
WBC: 4.1 10*3/uL (ref 4.0–10.5)

## 2022-06-05 LAB — COMPREHENSIVE METABOLIC PANEL
ALT: 17 U/L (ref 0–35)
AST: 20 U/L (ref 0–37)
Albumin: 4.1 g/dL (ref 3.5–5.2)
Alkaline Phosphatase: 77 U/L (ref 39–117)
BUN: 16 mg/dL (ref 6–23)
CO2: 25 mEq/L (ref 19–32)
Calcium: 9.4 mg/dL (ref 8.4–10.5)
Chloride: 108 mEq/L (ref 96–112)
Creatinine, Ser: 0.79 mg/dL (ref 0.40–1.20)
GFR: 67.67 mL/min (ref >60.00)
Glucose, Bld: 93 mg/dL (ref 70–99)
Potassium: 3.8 mEq/L (ref 3.5–5.1)
Sodium: 142 mEq/L (ref 135–145)
Total Bilirubin: 0.7 mg/dL (ref 0.2–1.2)
Total Protein: 7 g/dL (ref 6.0–8.3)

## 2022-06-05 LAB — LIPID PANEL
Cholesterol: 155 mg/dL (ref 0–200)
HDL: 66.1 mg/dL (ref >39.00)
LDL Cholesterol: 73 mg/dL (ref 0–99)
NonHDL: 88.4
Total CHOL/HDL Ratio: 2
Triglycerides: 77 mg/dL (ref 0.0–149.0)
VLDL: 15.4 mg/dL (ref 0.0–40.0)

## 2022-06-05 LAB — HEMOGLOBIN A1C: Hgb A1c MFr Bld: 6.2 % (ref 4.6–6.5)

## 2022-06-05 NOTE — Assessment & Plan Note (Signed)
Chronic DEXA due-ordered She is not exercising much and is very unsteady so would not be safe to exercise Continue calcium and vitamin D She would take medication as needed

## 2022-06-05 NOTE — Assessment & Plan Note (Signed)
Chronic °Regular exercise and healthy diet encouraged °Check lipid panel  °Continue atorvastatin 20 mg daily °

## 2022-06-05 NOTE — Assessment & Plan Note (Addendum)
Chronic Blood pressure adequately controlled for her age CMP Continue amlodipine 5 mg daily, metoprolol 50 mg twice daily

## 2022-06-05 NOTE — Assessment & Plan Note (Addendum)
Chronic Check a1c Low sugar / carb diet 

## 2022-06-05 NOTE — Assessment & Plan Note (Signed)
Chronic Following with cardiology On Eliquis 2.5 mg twice daily, metoprolol 50 mg twice daily CBC, CMP

## 2022-06-05 NOTE — Assessment & Plan Note (Addendum)
Chronic Has seen neurology Weakness likely related to chronic back pain, knee osteoarthritis and neuropathy Using a cane Denies any falls

## 2022-06-10 ENCOUNTER — Other Ambulatory Visit: Payer: Self-pay | Admitting: Cardiology

## 2022-06-10 ENCOUNTER — Encounter: Payer: Self-pay | Admitting: Internal Medicine

## 2022-06-10 ENCOUNTER — Other Ambulatory Visit: Payer: Self-pay | Admitting: Internal Medicine

## 2022-06-13 ENCOUNTER — Other Ambulatory Visit: Payer: Self-pay | Admitting: Internal Medicine

## 2022-06-13 MED ORDER — ALENDRONATE SODIUM 70 MG PO TABS: 70.0000 mg | ORAL_TABLET | ORAL | 3 refills | Status: DC

## 2022-06-14 ENCOUNTER — Ambulatory Visit: Payer: Medicare Other | Attending: Cardiology | Admitting: Cardiology

## 2022-06-14 ENCOUNTER — Encounter: Payer: Self-pay | Admitting: Cardiology

## 2022-06-14 VITALS — BP 140/88 | HR 103 | Wt 117.4 lb

## 2022-06-14 DIAGNOSIS — E7800 Pure hypercholesterolemia, unspecified: Secondary | ICD-10-CM

## 2022-06-14 DIAGNOSIS — F17200 Nicotine dependence, unspecified, uncomplicated: Secondary | ICD-10-CM | POA: Diagnosis not present

## 2022-06-14 DIAGNOSIS — I4821 Permanent atrial fibrillation: Secondary | ICD-10-CM | POA: Diagnosis not present

## 2022-06-14 DIAGNOSIS — I10 Essential (primary) hypertension: Secondary | ICD-10-CM | POA: Diagnosis not present

## 2022-06-14 NOTE — Patient Instructions (Signed)
  Follow-Up: At [facility], you and your health needs are our priority.  As part of our continuing mission to provide you with exceptional heart care, we have created designated Provider Care Teams.  These Care Teams include your primary Cardiologist (physician) and Advanced Practice Providers (APPs -  Physician Assistants and Nurse Practitioners) who all work together to provide you with the care you need, when you need it.  We recommend signing up for the patient portal called "MyChart".  Sign up information is provided on this After Visit Summary.  MyChart is used to connect with patients for Virtual Visits (Telemedicine).  Patients are able to view lab/test results, encounter notes, upcoming appointments, etc.  Non-urgent messages can be sent to your provider as well.   To learn more about what you can do with MyChart, go to [email or web address].    Your next appointment:   12 month(s)  The format for your next appointment:   In Person  Provider:   Any APP

## 2022-06-18 ENCOUNTER — Other Ambulatory Visit: Payer: Self-pay | Admitting: Cardiology

## 2022-06-18 DIAGNOSIS — I4821 Permanent atrial fibrillation: Secondary | ICD-10-CM

## 2022-06-18 NOTE — Telephone Encounter (Signed)
Prescription refill request for Eliquis received. Indication:Afib Last office visit:10/23 Scr:0.7 Age: 88 Weight:53.3 kg  Prescription refilled

## 2022-07-03 ENCOUNTER — Telehealth: Payer: Self-pay | Admitting: Internal Medicine

## 2022-07-03 NOTE — Telephone Encounter (Signed)
N/A unable to leave a message for patient to call back to schedule Medicare Annual Wellness Visit   Last AWV  01/26/21  Please schedule at anytime with [facility] if patient calls the office back.      Any questions, please call me at [phone]

## 2022-07-24 ENCOUNTER — Ambulatory Visit: Payer: Medicare Other | Admitting: *Deleted

## 2022-07-24 NOTE — Progress Notes (Signed)
Erroneous encounter

## 2022-07-24 NOTE — Patient Instructions (Signed)

## 2022-08-16 ENCOUNTER — Other Ambulatory Visit: Payer: Self-pay | Admitting: Cardiology

## 2022-10-18 ENCOUNTER — Telehealth: Payer: Self-pay | Admitting: Internal Medicine

## 2022-10-18 NOTE — Telephone Encounter (Signed)
Called Pt and let her know those forms have to be filled out by her and a notary. If she calls back pleas let her know forms are up front for her to pick up.

## 2022-10-18 NOTE — Telephone Encounter (Signed)
I left a voicemail since she didn't pick up.

## 2022-10-18 NOTE — Telephone Encounter (Signed)
Power of attorney forms have been left at the front for the provider to complete. Patient would like a call at [phone] when forms are completed and ready for pick up.

## 2022-10-18 NOTE — Telephone Encounter (Signed)
I was able to speak with the pt and inform her that her paperwork is ready for pick-up.

## 2022-10-19 ENCOUNTER — Other Ambulatory Visit: Payer: Self-pay | Admitting: Internal Medicine

## 2022-10-19 ENCOUNTER — Other Ambulatory Visit: Payer: Self-pay

## 2022-10-22 ENCOUNTER — Telehealth: Payer: Self-pay

## 2022-10-22 NOTE — Telephone Encounter (Signed)
Contacted [name] to schedule their annual wellness visit. Appointment made for 10/29/22.  Norton Blizzard, [place] ([facility])  [facility] [phone]

## 2022-10-29 ENCOUNTER — Ambulatory Visit (INDEPENDENT_AMBULATORY_CARE_PROVIDER_SITE_OTHER): Payer: Medicare Other

## 2022-10-29 DIAGNOSIS — Z0000 Encounter for general adult medical examination without abnormal findings: Secondary | ICD-10-CM | POA: Diagnosis not present

## 2022-10-29 NOTE — Progress Notes (Signed)
Subjective:   [name] is a 89 y.o. female who presents for Medicare Annual (Subsequent) preventive examination.  I connected with Monicia today by telephone and verified that I am speaking with the correct person using two identifiers. I discussed the limitations, risks, security and privacy concerns of performing an evaluation and management service by telephone and the availability of in person appointments. I also discussed with the patient that there may be a patient responsible charge related to this service. The patient expressed understanding and agreed to proceed. Location patient: home Location provider: Pietro Cassis Persons participating in the visit: [place] and Jillene Bucks, [place].  Time Spent with patient on telephone encounter: 15 mins  Review of Systems    No ROS. Medicare Wellness Telephone Visit. Additional risk factors are reflected in social history. Cardiac Risk Factors include: advanced age (>47mn, >>4women);hypertension;sedentary lifestyle     Objective:    Today's Vitals   10/29/22 [id number] PainSc: 8    There is no height or weight on file to calculate BMI.     10/29/2022    9:25 AM 01/26/2021    1:31 PM 08/16/2016    9:33 AM 04/23/2012   12:54 PM  Advanced Directives  Does Patient Have a Medical Advance Directive? Yes Yes Yes Patient has advance directive, copy not in chart  Type of Advance Directive Living will Living will Living will Living will  Does patient want to make changes to medical advance directive? No - Patient declined No - Patient declined    Copy of [place] Chart?    Copy requested from family  Pre-existing out of facility DNR order (yellow form or pink MOST form)    No    Current Medications (verified) Outpatient Encounter Medications as of 10/29/2022  Medication Sig   alendronate (FOSAMAX) 70 MG tablet Take 1 tablet (70 mg total) by mouth every 7 (seven) days. Take with a full glass of water on  an empty stomach.   amLODipine (NORVASC) 5 MG tablet Take 1 tablet (5 mg total) by mouth daily. PLEASE KEEP UPCOMING APPOINTMENT WITH CARDIOLOGIST FOR FUTURE REFILLS   atorvastatin (LIPITOR) 20 MG tablet TAKE 1 TABLET BY MOUTH EVERY DAY AT 6 PM   brimonidine (ALPHAGAN) 0.2 % ophthalmic solution Place 1 drop into the left eye 3 times daily.   Calcium Carb-Cholecalciferol (CALCIUM-VITAMIN D3) 600-400 MG-UNIT CAPS Take 1 tablet by mouth every other day.   dorzolamide-timolol (COSOPT) 22.3-6.8 MG/ML ophthalmic solution Place 1 drop into both eyes 2 times daily.   ELIQUIS 2.5 MG TABS tablet TAKE 1 TABLET BY MOUTH TWICE A DAY   gabapentin (NEURONTIN) 300 MG capsule TAKE 1 CAPSULE BY MOUTH THREE TIMES A DAY   metoprolol tartrate (LOPRESSOR) 50 MG tablet Take 1 tablet (50 mg total) by mouth 2 (two) times daily.   Vitamin D, Cholecalciferol, 50 MCG (2000 UT) CAPS Take 1 tablet by mouth every other day.   No facility-administered encounter medications on file as of 10/29/2022.    Allergies (verified) Patient has no known allergies.   History: Past Medical History:  Diagnosis Date   Anxiety    Atrial fibrillation ([place]    Cigarette smoker    Depression    Diverticulosis of colon    DJD (degenerative joint disease)    Family history of colon cancer    Glaucoma    Hemorrhoids    History of sebaceous cyst    R posterior scalp - observation elected  01/2015 after surgical eval   Hyperlipidemia    Hypertension    Osteopenia    Renal cyst    Restless leg syndrome    Vitamin D deficiency    Past Surgical History:  Procedure Laterality Date   CARDIOVERSION  04/23/2012   Procedure: CARDIOVERSION;  Surgeon: Hillary Bow, MD;  Location: [facility] ENDOSCOPY;  Service: Cardiovascular;  Laterality: N/A;   CATARACT EXTRACTION     COLONOSCOPY     retina surgery x 2  2013   TOTAL HIP ARTHROPLASTY  2/05   right by Dr. Tonita Cong   UPPER GASTROINTESTINAL ENDOSCOPY     Family History  Problem Relation Age of  Onset   Hypertension Mother    Diabetes Mother    Colon cancer Brother    Prostate cancer Father    Breast cancer Other        niece   Diabetes Brother    Diabetes Sister    Social History   Socioeconomic History   Marital status: Widowed    Spouse name: Not on file   Number of children: 0   Years of education: Not on file   Highest education level: Not on file  Occupational History   Occupation: Retired  Tobacco Use   Smoking status: Light Smoker    Packs/day: 0.30    Types: Cigarettes   Smokeless tobacco: Never   Tobacco comments:    4-5 cig a day  Substance and Sexual Activity   Alcohol use: No    Alcohol/week: 0.0 standard drinks of alcohol   Drug use: No   Sexual activity: Not Currently  Other Topics Concern   Not on file  Social History Narrative   Exercises 2 times per week   Caffeine use: 1 cup coffee per day   Lives alone, widowed 1978   Social Determinants of Health   Financial Resource Strain: Low Risk  (10/29/2022)   Overall Financial Resource Strain (CARDIA)    Difficulty of Paying Living Expenses: Not hard at all  Food Insecurity: No Food Insecurity (10/29/2022)   Hunger Vital Sign    Worried About Running Out of Food in the Last Year: Never true    Ran Out of Food in the Last Year: Never true  Transportation Needs: Unmet Transportation Needs (10/29/2022)   PRAPARE - Hydrologist (Medical): Yes    Lack of Transportation (Non-Medical): No  Physical Activity: Inactive (10/29/2022)   Exercise Vital Sign    Days of Exercise per Week: 0 days    Minutes of Exercise per Session: 0 min  Stress: No Stress Concern Present (10/29/2022)   [place]    Feeling of Stress : Not at all  Social Connections: Socially Isolated (10/29/2022)   Social Connection and Isolation Panel [NHANES]    Frequency of Communication with Friends and Family: Three times a week    Frequency of  Social Gatherings with Friends and Family: Once a week    Attends Religious Services: Never    Marine scientist or Organizations: No    Attends Archivist Meetings: Never    Marital Status: Widowed    Tobacco Counseling Ready to quit: Not Answered Counseling given: Not Answered Tobacco comments: 4-5 cig a day   Clinical Intake:  Pre-visit preparation completed: Yes  Pain : 0-10 Pain Score: 8  Pain Type: Chronic pain Pain Location: Other (Comment) (all over limb pain) Pain Orientation: Other (Comment) (  all over) Pain Descriptors / Indicators: Aching Pain Onset: More than a month ago Pain Frequency: Constant Effect of Pain on Daily Activities: unable to exercise     BMI - recorded: 18 Nutritional Status: BMI <19  Underweight Nutritional Risks: None Diabetes: No prediabetes  How often do you need to have someone help you when you read instructions, pamphlets, or other written materials from your doctor or pharmacy?: 1 - Never What is the last grade level you completed in school?: 12th grade  Interpreter Needed?: No  Information entered by :: Jillene Bucks, [place]   Activities of Daily Living    10/29/2022    9:28 AM  In your present state of health, do you have any difficulty performing the following activities:  Hearing? 1  Comment difficulty hearing  Vision? 1  Comment blind in left eye  Difficulty concentrating or making decisions? 0  Walking or climbing stairs? 1  Comment balance issues, uses cane  Dressing or bathing? 0  Doing errands, shopping? 0  Preparing Food and eating ? Y  Comment does not cook anymore  Using the Toilet? N  In the past six months, have you accidently leaked urine? N  Do you have problems with loss of bowel control? N  Managing your Medications? N  Managing your Finances? N  Housekeeping or managing your Housekeeping? N    Patient Care Team: Binnie Rail, MD as PCP - General (Internal Medicine) Stanford Breed Denice Bors,  MD as PCP - Cardiology (Cardiology) Lafayette Dragon, MD (Inactive) (Gastroenterology) Lelon Perla, MD (Cardiology) Susa Day, MD (Orthopedic Surgery) Bond, Tracie Harrier, MD (Ophthalmology) Erroll Luna, MD (General Surgery) Charlton Haws, [facility] as Pharmacist (Pharmacist)  Indicate any recent Medical Services you may have received from other than [facility] providers in the past year (date may be approximate).     Assessment:   This is a routine wellness examination for [facility].  Hearing/Vision screen Patient has hearing difficulty, does not wear hearing aids Patient is blind in left eye, does not wear corrective lenses.   Dietary issues and exercise activities discussed: Current Exercise Habits: The patient does not participate in regular exercise at present, Exercise limited by: orthopedic condition(s)   Goals Addressed             This Visit's Progress    Patient Stated       Patient declines health goal at this time.       Depression Screen    10/29/2022    9:25 AM 06/05/2022   10:43 AM 01/26/2021    1:29 PM 11/22/2020   12:43 PM 09/23/2018   11:04 AM 08/16/2016    9:34 AM 08/17/2015    9:23 AM  PHQ 2/9 Scores  PHQ - 2 Score 0 0 0 0 0 0 3  PHQ- 9 Score    6   10    Fall Risk    10/29/2022    9:27 AM 06/05/2022   11:06 AM 06/05/2022   10:42 AM 01/26/2021    1:37 PM 11/22/2020   11:02 AM  Fall Risk   Falls in the past year? 0 0 0 0 0  Number falls in past yr: 0 0 0 0   Injury with Fall? 0 0 0 0   Risk for fall due to : Impaired balance/gait No Fall Risks No Fall Risks No Fall Risks   Follow up Falls evaluation completed Falls evaluation completed Falls evaluation completed  Falls evaluation completed  FALL RISK PREVENTION PERTAINING TO THE HOME:  Any stairs in or around the home? No  If so, are there any without handrails?  NA Home free of loose throw rugs in walkways, pet beds, electrical cords, etc? Yes  Adequate lighting in your home to  reduce risk of falls? Yes   ASSISTIVE DEVICES UTILIZED TO PREVENT FALLS:  Life alert? Yes  Use of a cane, walker or w/c? Yes cane Grab bars in the bathroom? No  Shower chair or bench in shower? Yes  Elevated toilet seat or a handicapped toilet?  Handicap toilet  TIMED UP AND GO:  Was the test performed? No .  Length of time to ambulate 10 feet: N/A sec.   Patient states her gait is steady with use of her cane.   Cognitive Function: Patient is cogitatively intact.    08/16/2016    9:37 AM  MMSE - Mini Mental State Exam  Orientation to time 4  Orientation to Place 5  Registration 3  Attention/ Calculation 3  Recall 3  Language- name 2 objects 2  Language- repeat 1  Language- follow 3 step command 3  Language- read & follow direction 1  Write a sentence 1  Copy design 1  Total score 27        10/29/2022    9:34 AM  6CIT Screen  What Year? 0 points  What month? 0 points  What time? 0 points  Count back from 20 0 points  Months in reverse 2 points  Repeat phrase 2 points  Total Score 4 points    Immunizations Immunization History  Administered Date(s) Administered   Fluad Quad(high Dose 65+) 05/14/2019, 05/25/2020, 05/25/2021, 06/05/2022   Influenza Split 06/22/2011   Influenza Whole 06/20/2009, 06/21/2010, 06/18/2012   Influenza, High Dose Seasonal PF 06/10/2017   Influenza,inj,Quad PF,6+ Mos 06/18/2013, 06/23/2014, 05/27/2015   Influenza-Unspecified 05/27/2016, 06/27/2018   PFIZER(Purple Top)SARS-COV-2 Vaccination 10/20/2019, 11/10/2019   Pneumococcal Conjugate-13 08/12/2014   Pneumococcal Polysaccharide-23 08/17/2015   Td 08/12/2014   Zoster, Live 05/27/2016    TDAP status: Up to date  Flu Vaccine status: Up to date  Pneumococcal vaccine status: Up to date  Covid-19 vaccine status: Completed vaccines  Qualifies for Shingles Vaccine? Yes   Zostavax completed No   Shingrix Completed?: No.    Education has been provided regarding the importance of  this vaccine. Patient has been advised to call insurance company to determine out of pocket expense if they have not yet received this vaccine. Advised may also receive vaccine at local pharmacy or Health Dept. Verbalized acceptance and understanding.  Screening Tests Health Maintenance  Topic Date Due   Medicare Annual Wellness (AWV)  01/26/2022   COVID-19 Vaccine (3 - 2023-24 season) 11/14/2022 (Originally 04/27/2022)   DEXA SCAN  06/05/2024   DTaP/Tdap/Td (2 - Tdap) 08/12/2024   Pneumonia Vaccine 43+ Years old  Completed   INFLUENZA VACCINE  Completed   HPV VACCINES  Aged Out   Zoster Vaccines- Shingrix  Discontinued    Health Maintenance  Health Maintenance Due  Topic Date Due   Medicare Annual Wellness (AWV)  01/26/2022    Colorectal cancer screening: No longer required.   Mammogram status: No longer required due to age.  Bone Density status: Completed 06/05/22. Results reflect: Bone density results: OSTEOPOROSIS. Repeat every 2-3 years.  Lung Cancer Screening: (Low Dose CT Chest recommended if Age 33-80 years, 30 pack-year currently smoking OR have quit w/in 15years.) does qualify.   Lung Cancer Screening Referral:  N/A  Additional Screening:  Hepatitis C Screening: does not qualify; Completed N/A  Vision Screening: Recommended annual ophthalmology exams for early detection of glaucoma and other disorders of the eye. Is the patient up to date with their annual eye exam?  Yes  Who is the provider or what is the name of the office in which the patient attends annual eye exams? Dr. Drema Dallas If pt is not established with a provider, would they like to be referred to a provider to establish care? No .   Dental Screening: Recommended annual dental exams for proper oral hygiene  Community Resource Referral / Chronic Care Management: CRR required this visit?  No   CCM required this visit?  No      Plan:     I have personally reviewed and noted the following in the  patient's chart:   Medical and social history Use of alcohol, tobacco or illicit drugs  Current medications and supplements including opioid prescriptions. Patient is not currently taking opioid prescriptions. Functional ability and status Nutritional status Physical activity Advanced directives List of other physicians Hospitalizations, surgeries, and ER visits in previous 12 months Vitals Screenings to include cognitive, depression, and falls Referrals and appointments  In addition, I have reviewed and discussed with patient certain preventive protocols, quality metrics, and best practice recommendations. A written personalized care plan for preventive services as well as general preventive health recommendations were provided to patient.     Rossie Muskrat, CMA   10/29/2022   Nurse Notes: N/A

## 2022-12-04 ENCOUNTER — Encounter: Payer: Self-pay | Admitting: Internal Medicine

## 2022-12-04 NOTE — Progress Notes (Signed)
      Subjective:    Patient ID: [name], female    DOB: Oct 31, 1933, 89 y.o.   MRN: [id number]     HPI [name] is here for follow up of her chronic medical problems.    Medications and allergies reviewed with patient and updated if appropriate.  Current Outpatient Medications on File Prior to Visit  Medication Sig Dispense Refill   alendronate (FOSAMAX) 70 MG tablet Take 1 tablet (70 mg total) by mouth every 7 (seven) days. Take with a full glass of water on an empty stomach. 12 tablet 3   amLODipine (NORVASC) 5 MG tablet Take 1 tablet (5 mg total) by mouth daily. PLEASE KEEP UPCOMING APPOINTMENT WITH CARDIOLOGIST FOR FUTURE REFILLS 90 tablet 3   atorvastatin (LIPITOR) 20 MG tablet TAKE 1 TABLET BY MOUTH EVERY DAY AT 6 PM 90 tablet 1   brimonidine (ALPHAGAN) 0.2 % ophthalmic solution Place 1 drop into the left eye 3 times daily.     Calcium Carb-Cholecalciferol (CALCIUM-VITAMIN D3) 600-400 MG-UNIT CAPS Take 1 tablet by mouth every other day.     dorzolamide-timolol (COSOPT) 22.3-6.8 MG/ML ophthalmic solution Place 1 drop into both eyes 2 times daily.     ELIQUIS 2.5 MG TABS tablet TAKE 1 TABLET BY MOUTH TWICE A DAY 180 tablet 1   gabapentin (NEURONTIN) 300 MG capsule TAKE 1 CAPSULE BY MOUTH THREE TIMES A DAY 270 capsule 3   metoprolol tartrate (LOPRESSOR) 50 MG tablet Take 1 tablet (50 mg total) by mouth 2 (two) times daily. 180 tablet 2   Vitamin D, Cholecalciferol, 50 MCG (2000 UT) CAPS Take 1 tablet by mouth every other day.     No current facility-administered medications on file prior to visit.     Review of Systems     Objective:  There were no vitals filed for this visit. BP Readings from Last 3 Encounters:  06/14/22 (!) 140/88  06/05/22 (!) 140/84  11/23/21 (!) 144/84   Wt Readings from Last 3 Encounters:  06/14/22 117 lb 6.4 oz (53.3 kg)  06/05/22 116 lb (52.6 kg)  11/23/21 120 lb 12.8 oz (54.8 kg)   There is no height or weight on file to calculate  BMI.    Physical Exam     Lab Results  Component Value Date   WBC 4.1 06/05/2022   HGB 14.2 06/05/2022   HCT 42.9 06/05/2022   PLT 140.0 (L) 06/05/2022   GLUCOSE 93 06/05/2022   CHOL 155 06/05/2022   TRIG 77.0 06/05/2022   HDL 66.10 06/05/2022   LDLCALC 73 06/05/2022   ALT 17 06/05/2022   AST 20 06/05/2022   NA 142 06/05/2022   K 3.8 06/05/2022   CL 108 06/05/2022   CREATININE 0.79 06/05/2022   BUN 16 06/05/2022   CO2 25 06/05/2022   TSH 1.16 05/25/2021   INR 2.1 06/21/2015   HGBA1C 6.2 06/05/2022     Assessment & Plan:    See Problem List for Assessment and Plan of chronic medical problems.   This encounter was created in error - please disregard.

## 2022-12-04 NOTE — Patient Instructions (Addendum)
      Blood work was ordered.   The lab is on the first floor.    Medications changes include :       A referral was ordered for XXX.     Someone will call you to schedule an appointment.    Return in about 6 months (around 06/06/2023) for Physical Exam.

## 2022-12-05 ENCOUNTER — Encounter: Payer: Medicare Other | Admitting: Internal Medicine

## 2022-12-05 DIAGNOSIS — G629 Polyneuropathy, unspecified: Secondary | ICD-10-CM

## 2022-12-05 DIAGNOSIS — R29898 Other symptoms and signs involving the musculoskeletal system: Secondary | ICD-10-CM

## 2022-12-05 DIAGNOSIS — M810 Age-related osteoporosis without current pathological fracture: Secondary | ICD-10-CM

## 2022-12-05 DIAGNOSIS — E7800 Pure hypercholesterolemia, unspecified: Secondary | ICD-10-CM

## 2022-12-05 DIAGNOSIS — I4821 Permanent atrial fibrillation: Secondary | ICD-10-CM

## 2022-12-05 DIAGNOSIS — R7303 Prediabetes: Secondary | ICD-10-CM

## 2022-12-05 DIAGNOSIS — I10 Essential (primary) hypertension: Secondary | ICD-10-CM

## 2022-12-05 NOTE — Assessment & Plan Note (Signed)
Chronic Check a1c Low sugar / carb diet 

## 2022-12-05 NOTE — Assessment & Plan Note (Signed)
Chronic Controlled Poor balance-uses cane or walker when ambulating Continue gabapentin 300 mg 3 times daily-we have discussed possibly decreasing the dose to see if that improves energy

## 2022-12-05 NOTE — Assessment & Plan Note (Signed)
Chronic Following with cardiology On Eliquis 2.5 mg twice daily, metoprolol 50 mg twice daily CBC, CMP, TSH

## 2022-12-05 NOTE — Assessment & Plan Note (Signed)
Chronic Taking vitamin D daily Check vitamin D level  

## 2022-12-05 NOTE — Assessment & Plan Note (Signed)
Chronic Sees Dr Terrace Arabia Related to peripheral neuropathy, chronic lumbar radiculopathy and knee OA Taking gabapentin for neuropathy Uses cane or walker

## 2022-12-05 NOTE — Assessment & Plan Note (Signed)
Chronic Regular exercise and healthy diet encouraged Check lipid panel, CMP Continue atorvastatin 20 mg daily 

## 2022-12-05 NOTE — Assessment & Plan Note (Signed)
Chronic Blood pressure adequately controlled for her age CMP Continue amlodipine 5 mg daily, metoprolol 50 mg twice daily 

## 2022-12-05 NOTE — Assessment & Plan Note (Signed)
Chronic Has seen neurology- Dr Terrace Arabia Weakness likely related to chronic back pain, knee osteoarthritis and neuropathy secondary to lumbar disease Using a cane

## 2023-03-16 ENCOUNTER — Other Ambulatory Visit: Payer: Self-pay | Admitting: Cardiology

## 2023-03-16 DIAGNOSIS — I4821 Permanent atrial fibrillation: Secondary | ICD-10-CM

## 2023-03-18 NOTE — Telephone Encounter (Signed)
Prescription refill request for Eliquis received. Indication:afib Last office visit:10/23 Scr:0.79  10/23 Age: 89 Weight:53.3  kg  Prescription refilled

## 2023-03-28 ENCOUNTER — Other Ambulatory Visit: Payer: Self-pay | Admitting: Cardiology

## 2023-04-11 NOTE — Progress Notes (Unsigned)
Subjective:    Patient ID: [name], female    DOB: 1934-04-05, 89 y.o.   MRN: [id number]      HPI [name] is here for No chief complaint on file.    Weakness in legs- her legs are getting weaker  and weaker.  She does not leave her house.  Getting to the doctor's is becoming much more difficult.  She was wondering what options there were to avoid this.   She can do the necessities at her house.  She does not cook.  Her friend gets her food and it will last 2 days.  She is able to do the basic things around her house.  Declined wanting meals on wheels.    She is taking her medications as prescribed.   Medications and allergies reviewed with patient and updated if appropriate.  Current Outpatient Medications on File Prior to Visit  Medication Sig Dispense Refill   alendronate (FOSAMAX) 70 MG tablet Take 1 tablet (70 mg total) by mouth every 7 (seven) days. Take with a full glass of water on an empty stomach. 12 tablet 3   amLODipine (NORVASC) 5 MG tablet Take 1 tablet (5 mg total) by mouth daily. 90 tablet 0   atorvastatin (LIPITOR) 20 MG tablet TAKE 1 TABLET BY MOUTH EVERY DAY AT 6 PM 90 tablet 1   brimonidine (ALPHAGAN) 0.2 % ophthalmic solution Place 1 drop into the left eye 3 times daily.     Calcium Carb-Cholecalciferol (CALCIUM-VITAMIN D3) 600-400 MG-UNIT CAPS Take 1 tablet by mouth every other day.     dorzolamide-timolol (COSOPT) 22.3-6.8 MG/ML ophthalmic solution Place 1 drop into both eyes 2 times daily.     ELIQUIS 2.5 MG TABS tablet TAKE 1 TABLET BY MOUTH TWICE A DAY 180 tablet 1   gabapentin (NEURONTIN) 300 MG capsule TAKE 1 CAPSULE BY MOUTH THREE TIMES A DAY 270 capsule 3   metoprolol tartrate (LOPRESSOR) 50 MG tablet Take 1 tablet (50 mg total) by mouth 2 (two) times daily. 180 tablet 2   Vitamin D, Cholecalciferol, 50 MCG (2000 UT) CAPS Take 1 tablet by mouth every other day.     latanoprost (XALATAN) 0.005 % ophthalmic solution 1 drop at bedtime.      No current facility-administered medications on file prior to visit.    Review of Systems  Constitutional:  Negative for fever.  Respiratory:  Negative for cough, shortness of breath and wheezing.   Cardiovascular:  Negative for chest pain, palpitations and leg swelling.  Neurological:  Negative for light-headedness and headaches.       Objective:   Vitals:   04/12/23 1530 04/12/23 1536  BP: (!) 144/84 (!) 148/90  Pulse: 80   Temp: 98.1 F (36.7 C)   SpO2: 99%    BP Readings from Last 3 Encounters:  04/12/23 (!) 148/90  06/14/22 (!) 140/88  06/05/22 (!) 140/84   Wt Readings from Last 3 Encounters:  04/12/23 116 lb (52.6 kg)  06/14/22 117 lb 6.4 oz (53.3 kg)  06/05/22 116 lb (52.6 kg)   Body mass index is 18.72 kg/m.    Physical Exam Constitutional:      General: She is not in acute distress.    Appearance: Normal appearance.  HENT:     Head: Normocephalic and atraumatic.  Eyes:     Conjunctiva/sclera: Conjunctivae normal.  Cardiovascular:     Rate and Rhythm: Normal rate and regular rhythm.     Heart sounds: Normal heart sounds.  Pulmonary:     Effort: Pulmonary effort is normal. No respiratory distress.     Breath sounds: Normal breath sounds. No wheezing.  Musculoskeletal:     Cervical back: Neck supple.     Right lower leg: No edema.     Left lower leg: No edema.  Lymphadenopathy:     Cervical: No cervical adenopathy.  Skin:    General: Skin is warm and dry.     Findings: No rash.  Neurological:     Mental Status: She is alert. Mental status is at baseline.  Psychiatric:        Mood and Affect: Mood normal.        Behavior: Behavior normal.            Assessment & Plan:    See Problem List for Assessment and Plan of chronic medical problems.    Discussed switching primary care to doctors that make house calls.  Unfortunately I think it will become more difficult for her to come to the office and she does need routine follow-up.

## 2023-04-12 ENCOUNTER — Ambulatory Visit: Payer: Medicare Other | Admitting: Internal Medicine

## 2023-04-12 ENCOUNTER — Encounter: Payer: Self-pay | Admitting: Internal Medicine

## 2023-04-12 ENCOUNTER — Other Ambulatory Visit (INDEPENDENT_AMBULATORY_CARE_PROVIDER_SITE_OTHER): Payer: Medicare Other

## 2023-04-12 VITALS — BP 148/90 | HR 80 | Temp 98.1°F | Ht 66.0 in | Wt 116.0 lb

## 2023-04-12 DIAGNOSIS — I10 Essential (primary) hypertension: Secondary | ICD-10-CM | POA: Diagnosis not present

## 2023-04-12 DIAGNOSIS — I4821 Permanent atrial fibrillation: Secondary | ICD-10-CM

## 2023-04-12 DIAGNOSIS — R29898 Other symptoms and signs involving the musculoskeletal system: Secondary | ICD-10-CM | POA: Diagnosis not present

## 2023-04-12 DIAGNOSIS — E7800 Pure hypercholesterolemia, unspecified: Secondary | ICD-10-CM | POA: Diagnosis not present

## 2023-04-12 DIAGNOSIS — R7303 Prediabetes: Secondary | ICD-10-CM

## 2023-04-12 LAB — CBC WITH DIFFERENTIAL/PLATELET
Basophils Absolute: 0 10*3/uL (ref 0.0–0.1)
Basophils Relative: 0.8 % (ref 0.0–3.0)
Eosinophils Absolute: 0.1 10*3/uL (ref 0.0–0.7)
Eosinophils Relative: 3.6 % (ref 0.0–5.0)
HCT: 43.7 % (ref 36.0–46.0)
Hemoglobin: 13.9 g/dL (ref 12.0–15.0)
Lymphocytes Relative: 35 % (ref 12.0–46.0)
Lymphs Abs: 1.3 10*3/uL (ref 0.7–4.0)
MCHC: 31.8 g/dL (ref 30.0–36.0)
MCV: 93.5 fl (ref 78.0–100.0)
Monocytes Absolute: 0.5 10*3/uL (ref 0.1–1.0)
Monocytes Relative: 13.3 % — ABNORMAL HIGH (ref 3.0–12.0)
Neutro Abs: 1.8 10*3/uL (ref 1.4–7.7)
Neutrophils Relative %: 47.3 % (ref 43.0–77.0)
Platelets: 150 10*3/uL (ref 150.0–400.0)
RBC: 4.67 Mil/uL (ref 3.87–5.11)
RDW: 13.8 % (ref 11.5–15.5)
WBC: 3.8 10*3/uL — ABNORMAL LOW (ref 4.0–10.5)

## 2023-04-12 LAB — LIPID PANEL
Cholesterol: 162 mg/dL (ref 0–200)
HDL: 61.9 mg/dL (ref >39.00)
LDL Cholesterol: 87 mg/dL (ref 0–99)
NonHDL: 99.75
Total CHOL/HDL Ratio: 3
Triglycerides: 63 mg/dL (ref 0.0–149.0)
VLDL: 12.6 mg/dL (ref 0.0–40.0)

## 2023-04-12 LAB — COMPREHENSIVE METABOLIC PANEL
ALT: 15 U/L (ref 0–35)
AST: 19 U/L (ref 0–37)
Albumin: 4.2 g/dL (ref 3.5–5.2)
Alkaline Phosphatase: 55 U/L (ref 39–117)
BUN: 16 mg/dL (ref 6–23)
CO2: 29 meq/L (ref 19–32)
Calcium: 9.3 mg/dL (ref 8.4–10.5)
Chloride: 105 meq/L (ref 96–112)
Creatinine, Ser: 0.77 mg/dL (ref 0.40–1.20)
GFR: 69.37 mL/min (ref >60.00)
Glucose, Bld: 85 mg/dL (ref 70–99)
Potassium: 3.8 meq/L (ref 3.5–5.1)
Sodium: 139 meq/L (ref 135–145)
Total Bilirubin: 0.7 mg/dL (ref 0.2–1.2)
Total Protein: 6.9 g/dL (ref 6.0–8.3)

## 2023-04-12 NOTE — Patient Instructions (Signed)
      Blood work was ordered.   The lab is on the first floor.    Medications changes include :   none

## 2023-04-13 NOTE — Assessment & Plan Note (Signed)
Chronic Regular exercise and healthy diet encouraged Check lipid panel, CMP Continue atorvastatin 20 mg daily 

## 2023-04-13 NOTE — Assessment & Plan Note (Signed)
Chronic Blood pressure slightly elevated, but adequately controlled for her age CMP Continue amlodipine 5 mg daily, metoprolol 50 mg twice daily

## 2023-04-13 NOTE — Assessment & Plan Note (Signed)
Chronic Check a1c Low sugar / carb diet 

## 2023-04-13 NOTE — Assessment & Plan Note (Signed)
Chronic Following with cardiology On Eliquis 2.5 mg twice daily, metoprolol 50 mg twice daily CBC, CMP

## 2023-04-13 NOTE — Assessment & Plan Note (Signed)
Chronic Has seen neurology- Dr Terrace Arabia Weakness likely related to chronic back pain, knee osteoarthritis and neuropathy secondary to lumbar disease Using a cane

## 2023-04-15 LAB — HEMOGLOBIN A1C: Hgb A1c MFr Bld: 6 % (ref 4.6–6.5)

## 2023-04-23 ENCOUNTER — Other Ambulatory Visit: Payer: Self-pay | Admitting: Internal Medicine

## 2023-05-01 ENCOUNTER — Other Ambulatory Visit: Payer: Self-pay | Admitting: Cardiology

## 2023-05-01 ENCOUNTER — Other Ambulatory Visit: Payer: Self-pay | Admitting: Internal Medicine

## 2023-05-30 ENCOUNTER — Ambulatory Visit: Payer: Medicare Other | Admitting: Nurse Practitioner

## 2023-07-27 ENCOUNTER — Other Ambulatory Visit: Payer: Self-pay | Admitting: Cardiology

## 2023-09-06 ENCOUNTER — Other Ambulatory Visit: Payer: Self-pay | Admitting: Cardiology

## 2023-09-08 ENCOUNTER — Other Ambulatory Visit: Payer: Self-pay | Admitting: Cardiology

## 2023-09-18 ENCOUNTER — Other Ambulatory Visit: Payer: Self-pay | Admitting: Cardiology

## 2023-09-18 DIAGNOSIS — I4821 Permanent atrial fibrillation: Secondary | ICD-10-CM

## 2023-09-18 NOTE — Telephone Encounter (Signed)
Eliquis 2.5mg  refill request received. Patient is 89 years old, weight-52.6kg, Crea-0.77 on 04/12/23, Diagnosis-Afib, and last seen by Dr. Jens Som on 06/14/22. Dose is appropriate based on dosing criteria.   Pt needs an appt with Cardiologist. Will send message to Schedulers.  Last eliquis refill sent on 03/18/23-90 day supply with refill  09/19/23 Schedulers left message regarding scheduling an appointment.

## 2023-09-19 ENCOUNTER — Other Ambulatory Visit: Payer: Self-pay | Admitting: Internal Medicine

## 2023-09-19 ENCOUNTER — Telehealth: Payer: Self-pay | Admitting: Internal Medicine

## 2023-09-19 DIAGNOSIS — J449 Chronic obstructive pulmonary disease, unspecified: Secondary | ICD-10-CM

## 2023-09-19 DIAGNOSIS — G629 Polyneuropathy, unspecified: Secondary | ICD-10-CM

## 2023-09-19 DIAGNOSIS — M545 Low back pain, unspecified: Secondary | ICD-10-CM

## 2023-09-19 DIAGNOSIS — I4821 Permanent atrial fibrillation: Secondary | ICD-10-CM

## 2023-09-19 MED ORDER — APIXABAN 2.5 MG PO TABS
2.5000 mg | ORAL_TABLET | Freq: Two times a day (BID) | ORAL | 0 refills | Status: DC
Start: 2023-09-19 — End: 2023-09-20

## 2023-09-19 NOTE — Telephone Encounter (Signed)
Refill sent to [facility] for 1 month.  She needs to come in and have blood work done for [place] to be able to refill this on a lot of medications.

## 2023-09-19 NOTE — Telephone Encounter (Signed)
Copied from CRM [id number]. Topic: Clinical - Medication Question >> Sep 19, 2023 11:42 AM Larwance Sachs wrote: Reason for CRM: Patient called in and stated she was advised by Dr. Jens Som nurse that Dr.Burns needs to sign off on ELIQUIS 2.5 MG TABS tablet before it can be filled.

## 2023-09-19 NOTE — Telephone Encounter (Signed)
Copied from CRM [id number]. Topic: Clinical - Medication Refill >> Sep 19, 2023 11:32 AM Larwance Sachs wrote: Most Recent Primary Care Visit:  Provider: Pincus Sanes  Department: [facility]  Visit Type: OFFICE VISIT  Date: 04/12/2023  Medication: ELIQUIS 2.5 MG TABS tablet   Has the patient contacted their pharmacy? Yes, needs approval before refill and possibly new prescription  (Agent: If no, request that the patient contact the pharmacy for the refill. If patient does not wish to contact the pharmacy document the reason why and proceed with request.) (Agent: If yes, when and what did the pharmacy advise?)  Is this the correct pharmacy for this prescription? Yes If no, delete pharmacy and type the correct one.  This is the patient's preferred pharmacy:  [id number] [place], [place] - [address] [place] [address] Phone: [phone] Fax: [phone]  [id number] - [place], [place] - [address] AT [facility] [address] [place] [address] Phone: [phone] Fax: [phone]   Has the prescription been filled recently?   Is the patient out of the medication? No  Has the patient been seen for an appointment in the last year OR does the patient have an upcoming appointment?   Can we respond through MyChart? No  Agent: Please be advised that Rx refills may take up to 3 business days. We ask that you follow-up with your pharmacy.

## 2023-09-20 ENCOUNTER — Telehealth: Payer: Self-pay | Admitting: Cardiology

## 2023-09-20 DIAGNOSIS — I4821 Permanent atrial fibrillation: Secondary | ICD-10-CM

## 2023-09-20 MED ORDER — APIXABAN 2.5 MG PO TABS: 2.5000 mg | ORAL_TABLET | Freq: Two times a day (BID) | ORAL | 0 refills | Status: DC

## 2023-09-20 NOTE — Telephone Encounter (Signed)
Spoke with patient today and appointment offered to patient.  She is adamant that she can not come in and lives by herself.  She also does not have anyone either who can get her to her appointment.

## 2023-09-20 NOTE — Telephone Encounter (Signed)
Refill was recently sent by Dr Lawerance Bach; however, only a 30 day supply was sent. Since pt now has a scheduled appt with Crenshaw on 11/12/23. 90 day supply sent to pharmacy to prevent any missed doses until appt.

## 2023-09-20 NOTE — Telephone Encounter (Signed)
Called pt multiple times, no answer. Left message on voicemail.

## 2023-09-20 NOTE — Telephone Encounter (Signed)
*  STAT* If patient is at the pharmacy, call can be transferred to refill team.   1. Which medications need to be refilled? (please list name of each medication and dose if known) apixaban (ELIQUIS) 2.5 MG TABS tablet   2. Which pharmacy/location (including street and city if local pharmacy) is medication to be sent to?  [id number] - [place], [place] - [address]    3. Do they need a 30 day or 90 day supply? 90  Patient has appt on 3/18

## 2023-09-20 NOTE — Telephone Encounter (Signed)
Pt returned call ands left message on our voicemail. I attempted to call pt back, no answer. Left message on voicemail with my direct call back number.

## 2023-09-21 NOTE — Telephone Encounter (Signed)
I can refer her for palliative care and have a SW contact her about resources in the community that may help.  We need to find a solution because I can not prescribe medications without her coming in for routine visits.    Is she ok with the referrals?

## 2023-09-25 NOTE — Addendum Note (Signed)
Addended by: Pincus Sanes on: 09/25/2023 04:44 PM   Modules accepted: Orders

## 2023-09-25 NOTE — Telephone Encounter (Signed)
Referrals ordered for both

## 2023-09-26 ENCOUNTER — Telehealth: Payer: Self-pay | Admitting: *Deleted

## 2023-09-26 NOTE — Progress Notes (Signed)
Complex Care Management Note  Care Guide Note 09/26/2023 Name: ARIAN MCQUITTY MRN: [id number] DOB: 1933/11/21  [name] is a 89 y.o. year old female who sees Burns, Bobette Mo, MD for primary care. I reached out to [name] by phone today to offer complex care management services.  Ms. Paczkowski was given information about Complex Care Management services today including:   The Complex Care Management services include support from the care team which includes your Nurse Coordinator, Clinical Social Worker, or Pharmacist.  The Complex Care Management team is here to help remove barriers to the health concerns and goals most important to you. Complex Care Management services are voluntary, and the patient may decline or stop services at any time by request to their care team member.   Complex Care Management Consent Status: Patient agreed to services and verbal consent obtained.   Follow up plan:  Telephone appointment with complex care management team member scheduled for:  10/04/2023  Encounter Outcome:  Patient Scheduled  Burman Nieves, CMA, Care Guide [facility]  [facility], [facility] Guide Direct Dial: [phone]  Fax: [phone] Website: [email or web address]

## 2023-10-01 ENCOUNTER — Other Ambulatory Visit: Payer: Self-pay | Admitting: Cardiology

## 2023-10-04 ENCOUNTER — Ambulatory Visit: Payer: Self-pay | Admitting: Licensed Clinical Social Worker

## 2023-10-07 NOTE — Patient Outreach (Signed)
  Care Coordination   Follow Up Visit Note   10/07/2023 Name: [name] MRN: [id number] DOB: 12/08/1933  [name] is a 89 y.o. year old female who sees Burns, Glade PARAS, MD for primary care. I spoke with  [name] by phone today.  What matters to the patients health and wellness today?  HHA needs    Goals Addressed             This Visit's Progress    Care Coordination Activities       Care Coordination Interventions: Patient stated the she needs more assistance in the home so that she can get out the house more, she help with getting dressed and help with meals, she has a friend that has been helping her at least 2 to 3 times and she is paying her but needs to get more help.  SW completed the SDOH questions and no other needs SW will follow up on 10/21/2023 at 3:30pm         SDOH assessments and interventions completed:  Yes  SDOH Interventions Today    Flowsheet Row Most Recent Value  SDOH Interventions   Food Insecurity Interventions Intervention Not Indicated  [Patient has help going to buy food]  Transportation Interventions Intervention Not Indicated  Utilities Interventions Intervention Not Indicated        Care Coordination Interventions:  Yes, provided  Interventions Today    Flowsheet Row Most Recent Value  General Interventions   General Interventions Discussed/Reviewed General Interventions Discussed, [employer] needs and requested an HHA]        Follow up plan: Follow up call scheduled for 10/21/2023 at 3:00 pm    Encounter Outcome:  Patient Visit Completed   Tobias CHARM Maranda HEDWIG, PhD [facility], [facility] Social Worker Direct Dial: [phone]  Fax: [phone]

## 2023-10-07 NOTE — Patient Instructions (Signed)
 Visit Information  Thank you for taking time to visit with me today. Please don't hesitate to contact me if I can be of assistance to you.   Following are the goals we discussed today:   Goals Addressed             This Visit's Progress    Care Coordination Activities       Care Coordination Interventions: Patient stated the she needs more assistance in the home so that she can get out the house more, she help with getting dressed and help with meals, she has a friend that has been helping her at least 2 to 3 times and she is paying her but needs to get more help.  SW completed the SDOH questions and no other needs SW will follow up on 10/21/2023 at 3:30pm         Our next appointment is by telephone on 10/21/2023 at 3:00 pm  Please call the care guide team at [phone] if you need to cancel or reschedule your appointment.   If you are experiencing a Mental Health or Behavioral Health Crisis or need someone to talk to, please call the Suicide and Crisis Lifeline: 988 go to [facility] [address] [place] [phone]) call 911  The patient verbalized understanding of instructions, educational materials, and care plan provided today and DECLINED offer to receive copy of patient instructions, educational materials, and care plan.   Tobias CHARM Maranda HEDWIG, PhD [facility], [facility] Social Worker Direct Dial: [phone]  Fax: [phone]

## 2023-10-18 ENCOUNTER — Telehealth: Payer: Self-pay

## 2023-10-18 ENCOUNTER — Ambulatory Visit: Payer: Medicare Other

## 2023-10-18 VITALS — Ht 66.0 in | Wt 110.0 lb

## 2023-10-18 DIAGNOSIS — Z0000 Encounter for general adult medical examination without abnormal findings: Secondary | ICD-10-CM | POA: Diagnosis not present

## 2023-10-18 DIAGNOSIS — Z5982 Transportation insecurity: Secondary | ICD-10-CM | POA: Diagnosis not present

## 2023-10-18 DIAGNOSIS — Z5941 Food insecurity: Secondary | ICD-10-CM | POA: Diagnosis not present

## 2023-10-18 NOTE — Patient Instructions (Signed)
[name] , Thank you for taking time to come for your Medicare Wellness Visit. I appreciate your ongoing commitment to your health goals. Please review the following plan we discussed and let me know if I can assist you in the future.   Referrals/Orders/Follow-Ups/Clinician Recommendations: It was nice talking with you today.  You are due for a Flu vaccine.  I informed your PCP about getting you some help with home management.  Each day, aim for 6 glasses of water, plenty of protein in your diet and try to get up and walk/ stretch every hour for 5-10 minutes at a time.    This is a list of the screening recommended for you and due dates:  Health Maintenance  Topic Date Due   Flu Shot  03/28/2023   COVID-19 Vaccine (3 - 2024-25 season) 04/28/2023   DEXA scan (bone density measurement)  06/05/2024   DTaP/Tdap/Td vaccine (2 - Tdap) 08/12/2024   Medicare Annual Wellness Visit  10/17/2024   Pneumonia Vaccine  Completed   HPV Vaccine  Aged Out   Zoster (Shingles) Vaccine  Discontinued    Advanced directives: (In Chart) A copy of your advanced directives are scanned into your chart should your provider ever need it.  Next Medicare Annual Wellness Visit scheduled for next year: No

## 2023-10-18 NOTE — Progress Notes (Signed)
Subjective:   [name] is a 89 y.o. female who presents for Medicare Annual (Subsequent) preventive examination.  Visit Complete: Virtual I connected with  [name] on 10/18/23 by a audio enabled telemedicine application and verified that I am speaking with the correct person using two identifiers.  Patient Location: Home  Provider Location: Home Office  I discussed the limitations of evaluation and management by telemedicine. The patient expressed understanding and agreed to proceed.  Vital Signs: Because this visit was a virtual/telehealth visit, some criteria may be missing or patient reported. Any vitals not documented were not able to be obtained and vitals that have been documented are patient reported.  Cardiac Risk Factors include: advanced age (>62men, >27 women);hypertension;Other (see comment);dyslipidemia;smoking/ tobacco exposure, Risk factor comments: A-Fib, COPD, Neuropathy     Objective:    Today's Vitals   10/18/23 0936 10/18/23 0937  Weight: 110 lb (49.9 kg)   Height: 5\' 6"  (1.676 m)   PainSc:  9    Body mass index is 17.75 kg/m.     10/18/2023    9:50 AM 10/29/2022    9:25 AM 01/26/2021    1:31 PM 08/16/2016    9:33 AM 04/23/2012   12:54 PM  Advanced Directives  Does Patient Have a Medical Advance Directive? Yes Yes Yes Yes Patient has advance directive, copy not in chart  Type of Advance Directive Healthcare Power of [place];Living will Living will Living will Living will Living will  Does patient want to make changes to medical advance directive? No - Patient declined No - Patient declined No - Patient declined    Copy of Healthcare Power of Attorney in Chart? Yes - validated most recent copy scanned in chart (See row information)    Copy requested from family  Pre-existing out of facility DNR order (yellow form or pink MOST form)     No    Current Medications (verified) Outpatient Encounter Medications as of 10/18/2023  Medication Sig    alendronate (FOSAMAX) 70 MG tablet TAKE 1 TABLET (70 MG TOTAL) BY MOUTH EVERY 7 DAYS WITH FULL GLASS WATER ON EMPTY STOMACH   apixaban (ELIQUIS) 2.5 MG TABS tablet Take 1 tablet (2.5 mg total) by mouth 2 (two) times daily.   atorvastatin (LIPITOR) 20 MG tablet TAKE 1 TABLET BY MOUTH EVERY DAY AT 6 PM   brimonidine (ALPHAGAN) 0.2 % ophthalmic solution Place 1 drop into the left eye 3 times daily.   Calcium Carb-Cholecalciferol (CALCIUM-VITAMIN D3) 600-400 MG-UNIT CAPS Take 1 tablet by mouth every other day.   dorzolamide-timolol (COSOPT) 22.3-6.8 MG/ML ophthalmic solution Place 1 drop into both eyes 2 times daily.   latanoprost (XALATAN) 0.005 % ophthalmic solution 1 drop at bedtime.   metoprolol tartrate (LOPRESSOR) 50 MG tablet Take 1 tablet (50 mg total) by mouth 2 (two) times daily. PLEASE CALL OFFICE TO SCHEDULE APPT FOR FURTHER REFILLS. THANK YOU   amLODipine (NORVASC) 5 MG tablet TAKE 1 TABLET (5 MG TOTAL) BY MOUTH DAILY. (Patient not taking: Reported on 10/18/2023)   gabapentin (NEURONTIN) 300 MG capsule TAKE 1 CAPSULE BY MOUTH THREE TIMES A DAY   Vitamin D, Cholecalciferol, 50 MCG (2000 UT) CAPS Take 1 tablet by mouth every other day. (Patient not taking: Reported on 10/18/2023)   No facility-administered encounter medications on file as of 10/18/2023.    Allergies (verified) Patient has no known allergies.   History: Past Medical History:  Diagnosis Date   Anxiety    Atrial fibrillation (HCC)  Cigarette smoker    Depression    Diverticulosis of colon    DJD (degenerative joint disease)    Family history of colon cancer    Glaucoma    Hemorrhoids    History of sebaceous cyst    R posterior scalp - observation elected 01/2015 after surgical eval   Hyperlipidemia    Hypertension    Osteopenia    Renal cyst    Restless leg syndrome    Vitamin D deficiency    Past Surgical History:  Procedure Laterality Date   CARDIOVERSION  04/23/2012   Procedure: CARDIOVERSION;   Surgeon: Herby Abraham, MD;  Location: [facility] ENDOSCOPY;  Service: Cardiovascular;  Laterality: N/A;   CATARACT EXTRACTION     COLONOSCOPY     retina surgery x 2  2013   TOTAL HIP ARTHROPLASTY  2/05   right by Dr. Shelle Iron   UPPER GASTROINTESTINAL ENDOSCOPY     Family History  Problem Relation Age of Onset   Hypertension Mother    Diabetes Mother    Colon cancer Brother    Prostate cancer Father    Breast cancer Other        niece   Diabetes Brother    Diabetes Sister    Social History   Socioeconomic History   Marital status: Widowed    Spouse name: Not on file   Number of children: 0   Years of education: Not on file   Highest education level: Not on file  Occupational History   Occupation: Retired  Tobacco Use   Smoking status: Light Smoker    Current packs/day: 0.30    Types: Cigarettes   Smokeless tobacco: Never   Tobacco comments:    4-5 cig a day  Vaping Use   Vaping status: Never Used  Substance and Sexual Activity   Alcohol use: No    Alcohol/week: 0.0 standard drinks of alcohol   Drug use: No   Sexual activity: Not Currently  Other Topics Concern   Not on file  Social History Narrative   Exercises 2 times per week   Caffeine use: 1 cup coffee per day   Lives alone, widowed 1978   Social Drivers of Health   Financial Resource Strain: Low Risk  (10/18/2023)   Overall Financial Resource Strain (CARDIA)    Difficulty of Paying Living Expenses: Not very hard  Food Insecurity: No Food Insecurity (10/18/2023)   Hunger Vital Sign    Worried About Running Out of Food in the Last Year: Never true    Ran Out of Food in the Last Year: Never true  Transportation Needs: No Transportation Needs (10/18/2023)   PRAPARE - Administrator, Civil Service (Medical): No    Lack of Transportation (Non-Medical): No  Physical Activity: Inactive (10/18/2023)   Exercise Vital Sign    Days of Exercise per Week: 0 days    Minutes of Exercise per Session: 0 min   Stress: Stress Concern Present (10/18/2023)   [employer] of Occupational Health - Occupational Stress Questionnaire    Feeling of Stress : To some extent  Social Connections: Socially Isolated (10/18/2023)   Social Connection and Isolation Panel [NHANES]    Frequency of Communication with Friends and Family: Never    Frequency of Social Gatherings with Friends and Family: Never    Attends Religious Services: Never    Database administrator or Organizations: No    Attends Banker Meetings: Never    Marital Status:  Widowed    Tobacco Counseling Ready to quit: Not Answered Counseling given: Not Answered Tobacco comments: 4-5 cig a day   Clinical Intake:  Pre-visit preparation completed: Yes  Pain : 0-10 Pain Score: 9  Pain Type: Other (Comment) Pain Radiating Towards: all over Pain Descriptors / Indicators: Aching Pain Onset: More than a month ago Pain Frequency: Constant     BMI - recorded: 17.75        Information entered by :: Lyonel Morejon, RMA   Activities of Daily Living    10/18/2023    9:37 AM 10/29/2022    9:28 AM  In your present state of health, do you have any difficulty performing the following activities:  Hearing? 1 1  Comment  difficulty hearing  Vision? 1 1  Comment  blind in left eye  Difficulty concentrating or making decisions? 0 0  Walking or climbing stairs? 1 1  Comment  balance issues, uses cane  Dressing or bathing? 1 0  Comment Some what/patient is needing help getting ready in the mornings.  Cooking/dressing   Doing errands, shopping? 0 0  Preparing Food and eating ? Y Y  Comment Patient does not cook.  She states that she can not stand up too long. does not cook anymore  Using the Toilet? N N  In the past six months, have you accidently leaked urine? N N  Do you have problems with loss of bowel control? N N  Managing your Medications? N N  Managing your Finances? N N  Housekeeping or managing your Housekeeping?  Y N  Comment Patient stated that she is weaker which henders her from housekeeping     Patient Care Team: Pincus Sanes, MD as PCP - General (Internal Medicine) Jens Som Madolyn Frieze, MD as PCP - Cardiology (Cardiology) Hart Carwin, MD (Inactive) (Gastroenterology) Lewayne Bunting, MD (Cardiology) Jene Every, MD (Orthopedic Surgery) Bond, Doran Stabler, MD (Ophthalmology) Harriette Bouillon, MD (General Surgery) Kathyrn Sheriff, [facility] (Inactive) as Pharmacist (Pharmacist) Remigio Eisenmenger D as Social Worker  Indicate any recent Medical Services you may have received from other than [facility] providers in the past year (date may be approximate).     Assessment:   This is a routine wellness examination for [facility].  Hearing/Vision screen Hearing Screening - Comments:: Hearing issues Vision Screening - Comments:: Blind in left eye/Glaucoma   Goals Addressed   None   Depression Screen    10/18/2023    9:58 AM 10/29/2022    9:25 AM 06/05/2022   10:43 AM 01/26/2021    1:29 PM 11/22/2020   12:43 PM 09/23/2018   11:04 AM 08/16/2016    9:34 AM  PHQ 2/9 Scores  PHQ - 2 Score 1 0 0 0 0 0 0  PHQ- 9 Score 3    6      Fall Risk    10/18/2023    9:52 AM 10/29/2022    9:27 AM 06/05/2022   11:06 AM 06/05/2022   10:42 AM 01/26/2021    1:37 PM  Fall Risk   Falls in the past year? 0 0 0 0 0  Number falls in past yr: 0 0 0 0 0  Injury with Fall? 0 0 0 0 0  Risk for fall due to : No Fall Risks Impaired balance/gait No Fall Risks No Fall Risks No Fall Risks  Follow up Falls prevention discussed;Falls evaluation completed Falls evaluation completed Falls evaluation completed Falls evaluation completed     MEDICARE RISK  AT HOME: Medicare Risk at Home Any stairs in or around the home?: No Home free of loose throw rugs in walkways, pet beds, electrical cords, etc?: Yes Adequate lighting in your home to reduce risk of falls?: Yes Life alert?: Yes Use of a cane, walker or w/c?: Yes (cane and  walker) Grab bars in the bathroom?: Yes Shower chair or bench in shower?: Yes Elevated toilet seat or a handicapped toilet?: Yes  TIMED UP AND GO:  Was the test performed?  No    Cognitive Function:    08/16/2016    9:37 AM  MMSE - Mini Mental State Exam  Orientation to time 4  Orientation to Place 5  Registration 3  Attention/ Calculation 3  Recall 3  Language- name 2 objects 2  Language- repeat 1  Language- follow 3 step command 3  Language- read & follow direction 1  Write a sentence 1  Copy design 1  Total score 27        10/18/2023    9:38 AM 10/29/2022    9:34 AM  6CIT Screen  What Year? 0 points 0 points  What month? 0 points 0 points  What time? 0 points 0 points  Count back from 20 0 points 0 points  Months in reverse 0 points 2 points  Repeat phrase 0 points 2 points  Total Score 0 points 4 points    Immunizations Immunization History  Administered Date(s) Administered   Fluad Quad(high Dose 65+) 05/14/2019, 05/25/2020, 05/25/2021, 06/05/2022   Influenza Split 06/22/2011   Influenza Whole 06/20/2009, 06/21/2010, 06/18/2012   Influenza, High Dose Seasonal PF 06/10/2017   Influenza,inj,Quad PF,6+ Mos 06/18/2013, 06/23/2014, 05/27/2015   Influenza-Unspecified 05/27/2016, 06/27/2018   PFIZER(Purple Top)SARS-COV-2 Vaccination 10/20/2019, 11/10/2019   Pneumococcal Conjugate-13 08/12/2014   Pneumococcal Polysaccharide-23 08/17/2015   Td 08/12/2014   Zoster, Live 05/27/2016    TDAP status: Up to date  Flu Vaccine status: Due, Education has been provided regarding the importance of this vaccine. Advised may receive this vaccine at local pharmacy or Health Dept. Aware to provide a copy of the vaccination record if obtained from local pharmacy or Health Dept. Verbalized acceptance and understanding.  Pneumococcal vaccine status: Up to date  Covid-19 vaccine status: Declined, Education has been provided regarding the importance of this vaccine but patient  still declined. Advised may receive this vaccine at local pharmacy or Health Dept.or vaccine clinic. Aware to provide a copy of the vaccination record if obtained from local pharmacy or Health Dept. Verbalized acceptance and understanding.  Qualifies for Shingles Vaccine? Yes   Zostavax completed Yes   Shingrix Completed?: No.    Education has been provided regarding the importance of this vaccine. Patient has been advised to call insurance company to determine out of pocket expense if they have not yet received this vaccine. Advised may also receive vaccine at local pharmacy or Health Dept. Verbalized acceptance and understanding.  Screening Tests Health Maintenance  Topic Date Due   INFLUENZA VACCINE  03/28/2023   COVID-19 Vaccine (3 - 2024-25 season) 04/28/2023   DEXA SCAN  06/05/2024   DTaP/Tdap/Td (2 - Tdap) 08/12/2024   Medicare Annual Wellness (AWV)  10/17/2024   Pneumonia Vaccine 50+ Years old  Completed   HPV VACCINES  Aged Out   Zoster Vaccines- Shingrix  Discontinued    Health Maintenance  Health Maintenance Due  Topic Date Due   INFLUENZA VACCINE  03/28/2023   COVID-19 Vaccine (3 - 2024-25 season) 04/28/2023    Colorectal  cancer screening: No longer required.   Mammogram status: No longer required due to age.  Bone Density status: Completed 06/05/2022. Results reflect: Bone density results: OSTEOPOROSIS. Repeat every 2 years.  Lung Cancer Screening: (Low Dose CT Chest recommended if Age 9-80 years, 20 pack-year currently smoking OR have quit w/in 15years.) does not qualify.   Lung Cancer Screening Referral: N/A  Additional Screening:  Hepatitis C Screening: does not qualify;  Vision Screening: Recommended annual ophthalmology exams for early detection of glaucoma and other disorders of the eye. Is the patient up to date with their annual eye exam?  No  Who is the provider or what is the name of the office in which the patient attends annual eye exams? Dr.  Chiquita Loth If pt is not established with a provider, would they like to be referred to a provider to establish care? No .   Dental Screening: Recommended annual dental exams for proper oral hygiene   Community Resource Referral / Chronic Care Management: CRR required this visit?  Yes   CCM required this visit?  PCP informed of CCM need     Plan:     I have personally reviewed and noted the following in the patient's chart:   Medical and social history Use of alcohol, tobacco or illicit drugs  Current medications and supplements including opioid prescriptions. Patient is not currently taking opioid prescriptions. Functional ability and status Nutritional status Physical activity Advanced directives List of other physicians Hospitalizations, surgeries, and ER visits in previous 12 months Vitals Screenings to include cognitive, depression, and falls Referrals and appointments  In addition, I have reviewed and discussed with patient certain preventive protocols, quality metrics, and best practice recommendations. A written personalized care plan for preventive services as well as general preventive health recommendations were provided to patient.     Rayven Hendrickson L Zyan Mirkin, CMA   10/18/2023   After Visit Summary: (Mail) Due to this being a telephonic visit, the after visit summary with patients personalized plan was offered to patient via mail   Nurse Notes: Patient refused to schedule an appointment for a follow up with Dr. Lawerance Bach.  She states that she needs help getting ready to come to any of her appointments.  She is due for a Flu vaccine.  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  I did place a TE and sent it to Dr. Lawerance Bach today.  Patient had no other concerns to address today.

## 2023-10-18 NOTE — Telephone Encounter (Signed)
Patient stated that she has a hard time in the morning with housekeeping and getting herself ready.  Patient also does not cook anymore, as she is afraid of being too weak to stand up to cook and her vision in her left eye is completely gone.  Patient explained that she has mentioned this before and she has not heard anything about help yet.  I did pend a [id number] to you today.

## 2023-10-21 ENCOUNTER — Ambulatory Visit: Payer: Self-pay | Admitting: Licensed Clinical Social Worker

## 2023-10-21 NOTE — Patient Outreach (Signed)
 Care Coordination   Follow Up Visit Note   10/21/2023 Name: [name] MRN: [id number] DOB: 20-Sep-1933  [name] is a 89 y.o. year old female who sees Burns, Bobette Mo, MD for primary care. I spoke with  [name] by phone today.  What matters to the patients health and wellness today?  HHA     Goals Addressed             This Visit's Progress    Care Coordination Activities       Care Coordination Interventions: Patient stated the she needs more assistance in the home so that she can get out the house more, she help with getting dressed and help with meals, she has a friend that has been helping her at least 2 to 3 times and she is paying her but needs to get more help.  SW received a message from the PCP stating the following: "HHA is only covered by insurance if    she has PT or OT or home visiting nurse coming out and once the other service is not needed the HHA is done.  Insurance does not cover other HHA services.  If she has medicaid there may be some coverage.  If she needs any service she would need a face to face visit as well in order for insurance to cover PT, etc." (PCP)  SW sent another message to the PCP because she wants to nurse to call to make an appointment to talk about an order for HHA/PT. Patient wants help with bathing,a nd getting dressed in the morning.  SW completed the SDOH questions and no other needs SW will follow up on 11/08/2023 at 3:00pm         SDOH assessments and interventions completed:  Yes  SDOH Interventions Today    Flowsheet Row Most Recent Value  SDOH Interventions   Food Insecurity Interventions Intervention Not Indicated  Housing Interventions Intervention Not Indicated  Transportation Interventions Intervention Not Indicated  Utilities Interventions Intervention Not Indicated        Care Coordination Interventions:  Yes, provided  Interventions Today    Flowsheet Row Most Recent Value  General  Interventions   General Interventions Discussed/Reviewed General Interventions Reviewed  [In need of a HHA, SW will inbasket teh PCP]        Follow up plan: Follow up call scheduled for 11/08/2023 at 3:00 pm    Encounter Outcome:  Patient Visit Completed   Jeanie Cooks, PhD [facility], [facility] Social Worker Direct Dial: [phone]  Fax: [phone]

## 2023-10-21 NOTE — Patient Instructions (Signed)
 Visit Information  Thank you for taking time to visit with me today. Please don't hesitate to contact me if I can be of assistance to you.   Following are the goals we discussed today:   Goals Addressed             This Visit's Progress    Care Coordination Activities       Care Coordination Interventions: Patient stated the she needs more assistance in the home so that she can get out the house more, she help with getting dressed and help with meals, she has a friend that has been helping her at least 2 to 3 times and she is paying her but needs to get more help.  SW received a message from the PCP stating the following: "HHA is only covered by insurance if    she has PT or OT or home visiting nurse coming out and once the other service is not needed the HHA is done.  Insurance does not cover other HHA services.  If she has medicaid there may be some coverage.  If she needs any service she would need a face to face visit as well in order for insurance to cover PT, etc." (PCP)  SW sent another message to the PCP because she wants to nurse to call to make an appointment to talk about an order for HHA/PT. Patient wants help with bathing,a nd getting dressed in the morning.  SW completed the SDOH questions and no other needs SW will follow up on 11/08/2023 at 3:00pm         Our next appointment is by telephone on 11/08/2023 at 3:00 pm  Please call the care guide team at [phone] if you need to cancel or reschedule your appointment.   If you are experiencing a Mental Health or Behavioral Health Crisis or need someone to talk to, please call the Suicide and Crisis Lifeline: 988 go to [facility] [address] [place] [phone]) call 911    .cns

## 2023-10-30 NOTE — Progress Notes (Signed)
 Virtual Visit via telephone Note   Because of [name] co-morbid illnesses, she is at least at moderate risk for complications without adequate follow up.  This format is felt to be most appropriate for this patient at this time.  All issues noted in this document were discussed and addressed.  A limited physical exam was performed with this format.  Please refer to the patient's chart for her consent to telehealth for [facility].      Date:  11/12/2023   ID:  [name], DOB October 17, 1933, MRN [id number]  Patient Location:Home Provider Location: Office  PCP:  Pincus Sanes, MD  Cardiologist:  Dr Jens Som  Evaluation Performed:  Follow-Up Visit  Chief Complaint:  FU atrial fibrillation  History of Present Illness:    FU atrial fibrillation. She has a hx of s/p failed DCCV in 8/13, HTN, HL, tobacco abuse. Holter 10/14 showed afib, rate controlled.  Echocardiogram May 2022 showed normal LV function, moderate left atrial enlargement, mild mitral regurgitation.  Since last seen, the patient has dyspnea with more extreme activities but not with routine activities. It is relieved with rest. It is not associated with chest pain. There is no orthopnea, PND or pedal edema. There is no syncope or palpitations. There is no exertional chest pain.   The patient does not have symptoms concerning for COVID-19 infection (fever, chills, cough, or new shortness of breath).    Past Medical History:  Diagnosis Date   Anxiety    Atrial fibrillation (HCC)    Cigarette smoker    Depression    Diverticulosis of colon    DJD (degenerative joint disease)    Family history of colon cancer    Glaucoma    Hemorrhoids    History of sebaceous cyst    R posterior scalp - observation elected 01/2015 after surgical eval   Hyperlipidemia    Hypertension    Osteopenia    Renal cyst    Restless leg syndrome    Vitamin D deficiency    Past Surgical History:  Procedure  Laterality Date   CARDIOVERSION  04/23/2012   Procedure: CARDIOVERSION;  Surgeon: Herby Abraham, MD;  Location: [facility] ENDOSCOPY;  Service: Cardiovascular;  Laterality: N/A;   CATARACT EXTRACTION     COLONOSCOPY     retina surgery x 2  2013   TOTAL HIP ARTHROPLASTY  2/05   right by Dr. Shelle Iron   UPPER GASTROINTESTINAL ENDOSCOPY       No outpatient medications have been marked as taking for the 11/12/23 encounter (Telephone Visit ) with Lewayne Bunting, MD.     Allergies:   Patient has no known allergies.   Social History   Tobacco Use   Smoking status: Light Smoker    Current packs/day: 0.30    Types: Cigarettes   Smokeless tobacco: Never   Tobacco comments:    11/12/2023 Patient still smokes daily    4-5 cig a day  Vaping Use   Vaping status: Never Used  Substance Use Topics   Alcohol use: No    Alcohol/week: 0.0 standard drinks of alcohol   Drug use: No     Family Hx: The patient's family history includes Breast cancer in an other family member; Colon cancer in her brother; Diabetes in her brother, mother, and sister; Hypertension in her mother; Prostate cancer in her father.  ROS:   Please see the history of present illness.    No Fever, chills  or productive  cough All other systems reviewed and are negative.  Recent Labs: 11/08/2023: ALT 15; BUN 16; Creatinine, Ser 0.81; Hemoglobin 13.6; Platelets 151.0; Potassium 3.9; Sodium 141   Recent Lipid Panel Lab Results  Component Value Date/Time   CHOL 150 11/08/2023 03:40 PM   TRIG 66.0 11/08/2023 03:40 PM   HDL 66.90 11/08/2023 03:40 PM   CHOLHDL 2 11/08/2023 03:40 PM   LDLCALC 70 11/08/2023 03:40 PM   LDLCALC 77 05/25/2020 11:44 AM    Wt Readings from Last 3 Encounters:  11/12/23 118 lb (53.5 kg)  11/08/23 118 lb (53.5 kg)  10/18/23 110 lb (49.9 kg)     Objective:    Vital Signs:  Wt 118 lb (53.5 kg)   BMI 19.05 kg/m     NAD Answers questions appropriately Normal affect Remainder of physical  examination not performed (telehealth visit; coronavirus pandemic)  ASSESSMENT & PLAN:    Permanent atrial fibrillation-continue apixaban and beta-blocker. Hypertension-Continue present medical regimen. Tobacco abuse-patient counseled on discontinuing. Hyperlipidemia-continue statin.  COVID-19 Education: The importance of social distancing was discussed today.  Time:   Today, I have spent 16 minutes with the patient with telehealth technology discussing the above problems.     Medication Adjustments/Labs and Tests Ordered: Current medicines are reviewed at length with the patient today.  Concerns regarding medicines are outlined above.   Tests Ordered: No orders of the defined types were placed in this encounter.   Medication Changes: No orders of the defined types were placed in this encounter.   Follow Up:  In Person in 1 year(s)  Signed, Olga Millers, MD  11/12/2023 10:27 AM    [facility]

## 2023-11-07 NOTE — Patient Instructions (Addendum)
      Blood work was ordered.       Medications changes include :   None    A referral was ordered home PT and HHA and someone will call you to schedule an appointment.     Return in about 6 months (around 05/10/2024) for follow up.

## 2023-11-07 NOTE — Progress Notes (Unsigned)
 Subjective:    Patient ID: [name], female    DOB: July 04, 1934, 89 y.o.   MRN: [id number]     HPI [name] is here for follow up of her chronic medical problems.  Would like PT/HHA? - when discussed she did not feel that she needed.    She is taking her meds.  Her knees hurts- OA  Medications and allergies reviewed with patient and updated if appropriate.  Current Outpatient Medications on File Prior to Visit  Medication Sig Dispense Refill   alendronate (FOSAMAX) 70 MG tablet TAKE 1 TABLET (70 MG TOTAL) BY MOUTH EVERY 7 DAYS WITH FULL GLASS WATER ON EMPTY STOMACH 12 tablet 3   apixaban (ELIQUIS) 2.5 MG TABS tablet Take 1 tablet (2.5 mg total) by mouth 2 (two) times daily. 180 tablet 0   atorvastatin (LIPITOR) 20 MG tablet TAKE 1 TABLET BY MOUTH EVERY DAY AT 6 PM 90 tablet 1   brimonidine (ALPHAGAN) 0.2 % ophthalmic solution Place 1 drop into the left eye 3 times daily.     Calcium Carb-Cholecalciferol (CALCIUM-VITAMIN D3) 600-400 MG-UNIT CAPS Take 1 tablet by mouth every other day.     dorzolamide-timolol (COSOPT) 22.3-6.8 MG/ML ophthalmic solution Place 1 drop into both eyes 2 times daily.     gabapentin (NEURONTIN) 300 MG capsule TAKE 1 CAPSULE BY MOUTH THREE TIMES A DAY 270 capsule 3   latanoprost (XALATAN) 0.005 % ophthalmic solution 1 drop at bedtime.     metoprolol tartrate (LOPRESSOR) 50 MG tablet Take 1 tablet (50 mg total) by mouth 2 (two) times daily. PLEASE CALL OFFICE TO SCHEDULE APPT FOR FURTHER REFILLS. THANK YOU 30 tablet 0   amLODipine (NORVASC) 5 MG tablet TAKE 1 TABLET (5 MG TOTAL) BY MOUTH DAILY. (Patient not taking: Reported on 11/08/2023) 75 tablet 0   Vitamin D, Cholecalciferol, 50 MCG (2000 UT) CAPS Take 1 tablet by mouth every other day. (Patient not taking: Reported on 11/08/2023)     No current facility-administered medications on file prior to visit.     Review of Systems  Constitutional:  Positive for appetite change (decreased). Negative  for fever.  Respiratory:  Positive for shortness of breath. Negative for cough and wheezing.   Cardiovascular:  Positive for palpitations (with exertion). Negative for chest pain and leg swelling.  Musculoskeletal:  Positive for arthralgias (knees) and back pain.  Neurological:  Positive for light-headedness (occ). Negative for headaches.       Objective:   Vitals:   11/08/23 1437  BP: 138/86  Pulse: 68  Temp: 97.7 F (36.5 C)  SpO2: 96%   BP Readings from Last 3 Encounters:  11/08/23 138/86  04/12/23 (!) 148/90  06/14/22 (!) 140/88   Wt Readings from Last 3 Encounters:  11/08/23 118 lb (53.5 kg)  10/18/23 110 lb (49.9 kg)  04/12/23 116 lb (52.6 kg)   Body mass index is 19.05 kg/m.    Physical Exam Constitutional:      General: She is not in acute distress.    Appearance: Normal appearance.  HENT:     Head: Normocephalic and atraumatic.  Eyes:     Conjunctiva/sclera: Conjunctivae normal.  Cardiovascular:     Rate and Rhythm: Normal rate and regular rhythm.     Heart sounds: Normal heart sounds.  Pulmonary:     Effort: Pulmonary effort is normal. No respiratory distress.     Breath sounds: Normal breath sounds. No wheezing.  Musculoskeletal:     Cervical back:  Neck supple.     Right lower leg: No edema.     Left lower leg: No edema.  Lymphadenopathy:     Cervical: No cervical adenopathy.  Skin:    General: Skin is warm and dry.     Findings: No rash.  Neurological:     Mental Status: She is alert. Mental status is at baseline.  Psychiatric:        Mood and Affect: Mood normal.        Behavior: Behavior normal.        Lab Results  Component Value Date   WBC 3.8 (L) 04/12/2023   HGB 13.9 04/12/2023   HCT 43.7 04/12/2023   PLT 150.0 04/12/2023   GLUCOSE 85 04/12/2023   CHOL 162 04/12/2023   TRIG 63.0 04/12/2023   HDL 61.90 04/12/2023   LDLCALC 87 04/12/2023   ALT 15 04/12/2023   AST 19 04/12/2023   NA 139 04/12/2023   K 3.8 04/12/2023   CL  105 04/12/2023   CREATININE 0.77 04/12/2023   BUN 16 04/12/2023   CO2 29 04/12/2023   TSH 1.16 05/25/2021   INR 2.1 06/21/2015   HGBA1C 6.0 04/12/2023     Assessment & Plan:    See Problem List for Assessment and Plan of chronic medical problems.

## 2023-11-08 ENCOUNTER — Encounter: Payer: Self-pay | Admitting: Internal Medicine

## 2023-11-08 ENCOUNTER — Ambulatory Visit: Admitting: Internal Medicine

## 2023-11-08 ENCOUNTER — Ambulatory Visit: Payer: Self-pay | Admitting: Licensed Clinical Social Worker

## 2023-11-08 VITALS — BP 138/86 | HR 68 | Temp 97.7°F | Ht 66.0 in | Wt 118.0 lb

## 2023-11-08 DIAGNOSIS — I10 Essential (primary) hypertension: Secondary | ICD-10-CM

## 2023-11-08 DIAGNOSIS — E559 Vitamin D deficiency, unspecified: Secondary | ICD-10-CM

## 2023-11-08 DIAGNOSIS — M810 Age-related osteoporosis without current pathological fracture: Secondary | ICD-10-CM

## 2023-11-08 DIAGNOSIS — J449 Chronic obstructive pulmonary disease, unspecified: Secondary | ICD-10-CM

## 2023-11-08 DIAGNOSIS — E7800 Pure hypercholesterolemia, unspecified: Secondary | ICD-10-CM

## 2023-11-08 DIAGNOSIS — G629 Polyneuropathy, unspecified: Secondary | ICD-10-CM | POA: Diagnosis not present

## 2023-11-08 DIAGNOSIS — R7303 Prediabetes: Secondary | ICD-10-CM | POA: Diagnosis not present

## 2023-11-08 DIAGNOSIS — I4821 Permanent atrial fibrillation: Secondary | ICD-10-CM

## 2023-11-08 DIAGNOSIS — R29898 Other symptoms and signs involving the musculoskeletal system: Secondary | ICD-10-CM

## 2023-11-08 LAB — COMPREHENSIVE METABOLIC PANEL
ALT: 15 U/L (ref 0–35)
AST: 18 U/L (ref 0–37)
Albumin: 4.2 g/dL (ref 3.5–5.2)
Alkaline Phosphatase: 47 U/L (ref 39–117)
BUN: 16 mg/dL (ref 6–23)
CO2: 26 meq/L (ref 19–32)
Calcium: 9.4 mg/dL (ref 8.4–10.5)
Chloride: 108 meq/L (ref 96–112)
Creatinine, Ser: 0.81 mg/dL (ref 0.40–1.20)
GFR: 65.02 mL/min (ref >60.00)
Glucose, Bld: 76 mg/dL (ref 70–99)
Potassium: 3.9 meq/L (ref 3.5–5.1)
Sodium: 141 meq/L (ref 135–145)
Total Bilirubin: 1 mg/dL (ref 0.2–1.2)
Total Protein: 6.8 g/dL (ref 6.0–8.3)

## 2023-11-08 LAB — HEMOGLOBIN A1C: Hgb A1c MFr Bld: 6.2 % (ref 4.6–6.5)

## 2023-11-08 LAB — CBC WITH DIFFERENTIAL/PLATELET
Basophils Absolute: 0 10*3/uL (ref 0.0–0.1)
Basophils Relative: 0.9 % (ref 0.0–3.0)
Eosinophils Absolute: 0.1 10*3/uL (ref 0.0–0.7)
Eosinophils Relative: 2.4 % (ref 0.0–5.0)
HCT: 41.4 % (ref 36.0–46.0)
Hemoglobin: 13.6 g/dL (ref 12.0–15.0)
Lymphocytes Relative: 27.4 % (ref 12.0–46.0)
Lymphs Abs: 1.3 10*3/uL (ref 0.7–4.0)
MCHC: 32.9 g/dL (ref 30.0–36.0)
MCV: 92.9 fl (ref 78.0–100.0)
Monocytes Absolute: 0.5 10*3/uL (ref 0.1–1.0)
Monocytes Relative: 10.9 % (ref 3.0–12.0)
Neutro Abs: 2.7 10*3/uL (ref 1.4–7.7)
Neutrophils Relative %: 58.4 % (ref 43.0–77.0)
Platelets: 151 10*3/uL (ref 150.0–400.0)
RBC: 4.46 Mil/uL (ref 3.87–5.11)
RDW: 13.6 % (ref 11.5–15.5)
WBC: 4.6 10*3/uL (ref 4.0–10.5)

## 2023-11-08 LAB — LIPID PANEL
Cholesterol: 150 mg/dL (ref 0–200)
HDL: 66.9 mg/dL (ref >39.00)
LDL Cholesterol: 70 mg/dL (ref 0–99)
NonHDL: 82.89
Total CHOL/HDL Ratio: 2
Triglycerides: 66 mg/dL (ref 0.0–149.0)
VLDL: 13.2 mg/dL (ref 0.0–40.0)

## 2023-11-08 LAB — VITAMIN D 25 HYDROXY (VIT D DEFICIENCY, FRACTURES): VITD: 28.98 ng/mL — ABNORMAL LOW (ref 30.00–100.00)

## 2023-11-08 NOTE — Assessment & Plan Note (Signed)
 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

## 2023-11-08 NOTE — Patient Outreach (Signed)
 Care Coordination   11/08/2023 Name: [name] MRN: [id number] DOB: 26-Jun-1934   Care Coordination Outreach Attempts:  An unsuccessful outreach was attempted for an appointment today.  Follow Up Plan:  Additional outreach attempts will be made to offer the patient complex care management information and services.   Encounter Outcome:  No Answer   Care Coordination Interventions:  No, not indicated    .cns

## 2023-11-08 NOTE — Assessment & Plan Note (Signed)
 Chronic Controlled Poor balance-uses cane or walker when ambulating Continue gabapentin 300 mg 3 times daily ? Gabapentin decreasing her energy

## 2023-11-08 NOTE — Assessment & Plan Note (Signed)
 Chronic Blood pressure slightly elevated, but adequately controlled for her age CMP Continue metoprolol 50 mg twice daily

## 2023-11-08 NOTE — Assessment & Plan Note (Signed)
Chronic Check a1c Low sugar / carb diet

## 2023-11-08 NOTE — Assessment & Plan Note (Signed)
 Chronic DEXA up to date She is not exercising much and is very unsteady so would not be safe to exercise Fall prevention Continue calcium and vitamin D Fosamax 70 mg weekly - started 05/2022

## 2023-11-08 NOTE — Assessment & Plan Note (Signed)
 Chronic Check lipid panel, CMP Continue atorvastatin 20 mg daily

## 2023-11-08 NOTE — Assessment & Plan Note (Signed)
Chronic Following with cardiology On Eliquis 2.5 mg twice daily, metoprolol 50 mg twice daily CBC, CMP

## 2023-11-08 NOTE — Assessment & Plan Note (Signed)
Chronic °Not taking vitamin D daily °Check vitamin D level ° °

## 2023-11-12 ENCOUNTER — Encounter: Payer: Self-pay | Admitting: Cardiology

## 2023-11-12 ENCOUNTER — Telehealth: Payer: Self-pay

## 2023-11-12 ENCOUNTER — Ambulatory Visit: Payer: Medicare Other | Attending: Internal Medicine | Admitting: Cardiology

## 2023-11-12 DIAGNOSIS — I4891 Unspecified atrial fibrillation: Secondary | ICD-10-CM | POA: Diagnosis not present

## 2023-11-12 NOTE — Telephone Encounter (Signed)
hc

## 2023-11-12 NOTE — Patient Instructions (Signed)
   Follow-Up: At [facility], you and your health needs are our priority.  As part of our continuing mission to provide you with exceptional heart care, we have created designated Provider Care Teams.  These Care Teams include your primary Cardiologist (physician) and Advanced Practice Providers (APPs -  Physician Assistants and Nurse Practitioners) who all work together to provide you with the care you need, when you need it.  We recommend signing up for the patient portal called "MyChart".  Sign up information is provided on this After Visit Summary.  MyChart is used to connect with patients for Virtual Visits (Telemedicine).  Patients are able to view lab/test results, encounter notes, upcoming appointments, etc.  Non-urgent messages can be sent to your provider as well.   To learn more about what you can do with MyChart, go to [email or web address].    Your next appointment:   12 month(s)  Provider:   Olga Millers, MD

## 2023-11-12 NOTE — Telephone Encounter (Signed)
    Patient Consent for Virtual Visit        [name] has provided verbal consent on 11/12/2023 for a virtual visit (video or telephone).   CONSENT FOR VIRTUAL VISIT FOR:  [name]  By participating in this virtual visit I agree to the following:  I hereby voluntarily request, consent and authorize [facility] and its employed or contracted physicians, physician assistants, nurse practitioners or other licensed health care professionals (the Practitioner), to provide me with telemedicine health care services (the "Services") as deemed necessary by the treating Practitioner. I acknowledge and consent to receive the Services by the Practitioner via telemedicine. I understand that the telemedicine visit will involve communicating with the Practitioner through live audiovisual communication technology and the disclosure of certain medical information by electronic transmission. I acknowledge that I have been given the opportunity to request an in-person assessment or other available alternative prior to the telemedicine visit and am voluntarily participating in the telemedicine visit.  I understand that I have the right to withhold or withdraw my consent to the use of telemedicine in the course of my care at any time, without affecting my right to future care or treatment, and that the Practitioner or I may terminate the telemedicine visit at any time. I understand that I have the right to inspect all information obtained and/or recorded in the course of the telemedicine visit and may receive copies of available information for a reasonable fee.  I understand that some of the potential risks of receiving the Services via telemedicine include:  Delay or interruption in medical evaluation due to technological equipment failure or disruption; Information transmitted may not be sufficient (e.g. poor resolution of images) to allow for appropriate medical decision making by the  Practitioner; and/or  In rare instances, security protocols could fail, causing a breach of personal health information.  Furthermore, I acknowledge that it is my responsibility to provide information about my medical history, conditions and care that is complete and accurate to the best of my ability. I acknowledge that Practitioner's advice, recommendations, and/or decision may be based on factors not within their control, such as incomplete or inaccurate data provided by me or distortions of diagnostic images or specimens that may result from electronic transmissions. I understand that the practice of medicine is not an exact science and that Practitioner makes no warranties or guarantees regarding treatment outcomes. I acknowledge that a copy of this consent can be made available to me via my patient portal [facility] MyChart), or I can request a printed copy by calling the office of [facility].    I understand that my insurance will be billed for this visit.   I have read or had this consent read to me. I understand the contents of this consent, which adequately explains the benefits and risks of the Services being provided via telemedicine.  I have been provided ample opportunity to ask questions regarding this consent and the Services and have had my questions answered to my satisfaction. I give my informed consent for the services to be provided through the use of telemedicine in my medical care

## 2023-11-18 ENCOUNTER — Other Ambulatory Visit: Payer: Self-pay | Admitting: Cardiology

## 2023-11-18 ENCOUNTER — Other Ambulatory Visit: Payer: Self-pay | Admitting: Internal Medicine

## 2023-11-18 DIAGNOSIS — I4821 Permanent atrial fibrillation: Secondary | ICD-10-CM

## 2023-11-18 NOTE — Telephone Encounter (Signed)
 Prescription refill request for Eliquis received. Indication: AFIB  Last office visit: Crenshaw, 11/12/2023 Scr: 0.81, 11/08/2023 Age: 89 yo  Weight: 53.5 kg

## 2023-11-20 ENCOUNTER — Ambulatory Visit: Payer: Self-pay | Admitting: Licensed Clinical Social Worker

## 2023-11-20 NOTE — Patient Instructions (Signed)
 Visit Information  Thank you for taking time to visit with me today. Please don't hesitate to contact me if I can be of assistance to you.   Following are the goals we discussed today:   Goals Addressed             This Visit's Progress    COMPLETED: Care Coordination Activities       Care Coordination Interventions: Patient stated the she needs more assistance in the home so that she can get out the house more, she help with getting dressed and help with meals, she has a friend that has been helping her at least 2 to 3 times and she is paying her but needs to get more help.  SW received a message from the PCP stating the following: "HHA is only covered by insurance if    she has PT or OT or home visiting nurse coming out and once the other service is not needed the HHA is done.  Insurance does not cover other HHA services.  If she has medicaid there may be some coverage.  If she needs any service she would need a face to face visit as well in order for insurance to cover PT, etc." (PCP)  SW sent another message to the PCP because she wants to nurse to call to make an appointment to talk about an order for HHA/PT. Patient wants help with bathing,a nd getting dressed in the morning.  SW completed the SDOH questions and no other needs SW will follow up on 11/08/2023 at 3:00pm         No further follow up , Sw encouraged the patient to contact the PCP if any SDOH needs arise.  Please call the care guide team at [phone] if you need to cancel or reschedule your appointment.   If you are experiencing a Mental Health or Behavioral Health Crisis or need someone to talk to, please call the Suicide and Crisis Lifeline: 988 go to [facility] [address] [place] [phone]) call 911  The patient verbalized understanding of instructions, educational materials, and care plan provided today and DECLINED offer to receive copy of patient instructions,  educational materials, and care plan.   Jeanie Cooks, PhD [facility], [facility] Social Worker Direct Dial: [phone]  Fax: [phone]

## 2023-11-20 NOTE — Patient Outreach (Signed)
 Care Coordination   Follow Up Visit Note   11/20/2023 Name: [name] MRN: [id number] DOB: 03-31-1934  [name] is a 89 y.o. year old female who sees Burns, Bobette Mo, MD for primary care. I spoke with  [name] by phone today.  What matters to the patients health and wellness today?  HHA     Goals Addressed             This Visit's Progress    COMPLETED: Care Coordination Activities       Care Coordination Interventions: Patient stated the she needs more assistance in the home so that she can get out the house more, she help with getting dressed and help with meals, she has a friend that has been helping her at least 2 to 3 times and she is paying her but needs to get more help.  SW received a message from the PCP stating the following: "HHA is only covered by insurance if    she has PT or OT or home visiting nurse coming out and once the other service is not needed the HHA is done.  Insurance does not cover other HHA services.  If she has medicaid there may be some coverage.  If she needs any service she would need a face to face visit as well in order for insurance to cover PT, etc." (PCP)  SW sent another message to the PCP because she wants to nurse to call to make an appointment to talk about an order for HHA/PT. Patient wants help with bathing,a nd getting dressed in the morning.  SW completed the SDOH questions and no other needs SW will follow up on 11/08/2023 at 3:00pm         SDOH assessments and interventions completed:  Yes  SDOH Interventions Today    Flowsheet Row Most Recent Value  SDOH Interventions   Food Insecurity Interventions Intervention Not Indicated  Housing Interventions Intervention Not Indicated  Transportation Interventions Intervention Not Indicated  Utilities Interventions Intervention Not Indicated        Care Coordination Interventions:  Yes, provided  Interventions Today    Flowsheet Row Most Recent Value  General  Interventions   General Interventions Discussed/Reviewed General Interventions Reviewed  [Patient stated that she is ok and has communicated with the PCP, SW will close the case out.]        Follow up plan: No further intervention required.   Encounter Outcome:  Patient Visit Completed  Jeanie Cooks, PhD [facility], [facility] Social Worker Direct Dial: [phone]  Fax: [phone]

## 2023-11-25 NOTE — Telephone Encounter (Signed)
 Marland Kitchen

## 2024-02-12 ENCOUNTER — Other Ambulatory Visit: Payer: Self-pay | Admitting: Internal Medicine

## 2024-03-10 ENCOUNTER — Telehealth: Payer: Self-pay | Admitting: Cardiology

## 2024-03-10 NOTE — Telephone Encounter (Signed)
 Pt c/o medication issue:  1. Name of Medication:   apixaban  (ELIQUIS ) 2.5 MG TABS tablet   2. How are you currently taking this medication (dosage and times per day)?   As prescribed  3. Are you having a reaction (difficulty breathing--STAT)?   4. What is your medication issue?   Patient stated she has been on this medication for years and has been having aching in her muscle and joints and wants as call back to discuss if she has been on this medication too long.

## 2024-03-10 NOTE — Telephone Encounter (Signed)
 Achy muscles and joints have been occurring for quite awhile now.  She has been massaging them but no improvement.  Pt aware that Eliquis  is the least likely culprit, but that her Lipitor may be the cause.  Advised to hold for 2 weeks and see if improvement/reduction in aches.  Advised to call us  in 2 weeks and let us  know how she is doing.  Patient verbalized understanding and agreeable to plan.  Forwarding to MD & nurse for FYI and follow up in several weeks.

## 2024-03-24 ENCOUNTER — Encounter: Payer: Self-pay | Admitting: Cardiology

## 2024-03-24 NOTE — Telephone Encounter (Signed)
 error

## 2024-03-24 NOTE — Telephone Encounter (Signed)
 Spoke to patient Dr.Crenshaw's advice given.She will stay off Lipitor.Advised to call PCP.

## 2024-03-24 NOTE — Telephone Encounter (Signed)
 Spoke to patient she stated she stopped taking Atorvastatin  3 weeks ago.She cannot tell any difference.She continues to have pain in shoulders,legs.Stated she is very weak.She has to hold on to things when walking in house.Advised I will send message to [facility] for advice.

## 2024-03-24 NOTE — Telephone Encounter (Signed)
 Pt is calling because the medication she was advised to go off of has not helped the aches and pain in her joints, and would like to speak about possibly trying another medication to help with the pain. Please advise.

## 2024-05-11 ENCOUNTER — Ambulatory Visit: Admitting: Internal Medicine

## 2024-07-09 ENCOUNTER — Other Ambulatory Visit: Payer: Self-pay

## 2024-07-09 DIAGNOSIS — I4821 Permanent atrial fibrillation: Secondary | ICD-10-CM

## 2024-07-13 MED ORDER — APIXABAN 2.5 MG PO TABS: 2.5000 mg | ORAL_TABLET | Freq: Two times a day (BID) | ORAL | 1 refills | Status: AC

## 2024-07-13 NOTE — Telephone Encounter (Signed)
 Pt last saw Dr Pietro 11/12/23, last labs 11/08/23 Creat 0.81, age 89, weight 53.5kg, based on specified criteria pt is on appropriate dosage of Eliquis  2.5mg  BID.  Will refill rx.

## 2024-10-19 ENCOUNTER — Ambulatory Visit: Payer: Medicare Other

## 2024-10-20 ENCOUNTER — Ambulatory Visit
